# Patient Record
Sex: Male | Born: 1940 | Race: Black or African American | Hispanic: No | Smoking: Current every day smoker
Health system: Southern US, Community
[De-identification: ages and names within clinical notes are randomized; demographics above are authoritative.]

## PROBLEM LIST (undated history)

## (undated) DIAGNOSIS — K089 Disorder of teeth and supporting structures, unspecified: Secondary | ICD-10-CM

## (undated) DIAGNOSIS — I4892 Unspecified atrial flutter: Secondary | ICD-10-CM

## (undated) DIAGNOSIS — I509 Heart failure, unspecified: Secondary | ICD-10-CM

## (undated) DIAGNOSIS — D509 Iron deficiency anemia, unspecified: Secondary | ICD-10-CM

## (undated) DIAGNOSIS — I340 Nonrheumatic mitral (valve) insufficiency: Secondary | ICD-10-CM

## (undated) DIAGNOSIS — J449 Chronic obstructive pulmonary disease, unspecified: Secondary | ICD-10-CM

## (undated) DIAGNOSIS — Z9289 Personal history of other medical treatment: Secondary | ICD-10-CM

## (undated) DIAGNOSIS — I351 Nonrheumatic aortic (valve) insufficiency: Secondary | ICD-10-CM

## (undated) DIAGNOSIS — F0390 Unspecified dementia without behavioral disturbance: Secondary | ICD-10-CM

## (undated) DIAGNOSIS — C61 Malignant neoplasm of prostate: Secondary | ICD-10-CM

## (undated) HISTORY — PX: TOTAL HIP ARTHROPLASTY: SHX124

## (undated) HISTORY — DX: Nonrheumatic mitral (valve) insufficiency: I34.0

## (undated) HISTORY — DX: Chronic obstructive pulmonary disease, unspecified: J44.9

## (undated) HISTORY — DX: Nonrheumatic aortic (valve) insufficiency: I35.1

## (undated) HISTORY — DX: Iron deficiency anemia, unspecified: D50.9

## (undated) HISTORY — DX: Malignant neoplasm of prostate: C61

## (undated) HISTORY — DX: Disorder of teeth and supporting structures, unspecified: K08.9

## (undated) HISTORY — PX: JOINT REPLACEMENT: SHX530

## (undated) SURGERY — INCISION AND DRAINAGE
Anesthesia: General | Laterality: Right

## (undated) SURGERY — IRRIGATION AND DEBRIDEMENT HIP
Anesthesia: General | Laterality: Right

---

## 1998-03-31 ENCOUNTER — Emergency Department (HOSPITAL_COMMUNITY): Admission: EM | Admit: 1998-03-31 | Discharge: 1998-03-31 | Payer: Self-pay | Admitting: Emergency Medicine

## 1998-09-04 ENCOUNTER — Encounter: Payer: Self-pay | Admitting: Orthopedic Surgery

## 1998-09-05 ENCOUNTER — Inpatient Hospital Stay (HOSPITAL_COMMUNITY): Admission: RE | Admit: 1998-09-05 | Discharge: 1998-09-10 | Payer: Self-pay | Admitting: Orthopedic Surgery

## 1998-09-05 ENCOUNTER — Encounter: Payer: Self-pay | Admitting: Orthopedic Surgery

## 1998-09-10 ENCOUNTER — Inpatient Hospital Stay (HOSPITAL_COMMUNITY)
Admission: RE | Admit: 1998-09-10 | Discharge: 1998-09-13 | Payer: Self-pay | Admitting: Physical Medicine & Rehabilitation

## 2000-11-18 ENCOUNTER — Encounter: Payer: Self-pay | Admitting: Orthopedic Surgery

## 2000-11-23 ENCOUNTER — Inpatient Hospital Stay (HOSPITAL_COMMUNITY): Admission: RE | Admit: 2000-11-23 | Discharge: 2000-11-27 | Payer: Self-pay | Admitting: Orthopedic Surgery

## 2000-11-23 ENCOUNTER — Encounter: Payer: Self-pay | Admitting: Orthopedic Surgery

## 2002-03-26 ENCOUNTER — Emergency Department (HOSPITAL_COMMUNITY): Admission: EM | Admit: 2002-03-26 | Discharge: 2002-03-26 | Payer: Self-pay

## 2004-12-02 ENCOUNTER — Ambulatory Visit (HOSPITAL_COMMUNITY): Admission: RE | Admit: 2004-12-02 | Discharge: 2004-12-02 | Payer: Self-pay | Admitting: Orthopedic Surgery

## 2005-10-20 ENCOUNTER — Emergency Department (HOSPITAL_COMMUNITY): Admission: EM | Admit: 2005-10-20 | Discharge: 2005-10-21 | Payer: Self-pay | Admitting: Emergency Medicine

## 2005-10-21 ENCOUNTER — Encounter: Payer: Self-pay | Admitting: Vascular Surgery

## 2005-10-21 ENCOUNTER — Emergency Department (HOSPITAL_COMMUNITY): Admission: EM | Admit: 2005-10-21 | Discharge: 2005-10-21 | Payer: Self-pay | Admitting: Family Medicine

## 2005-10-21 ENCOUNTER — Ambulatory Visit (HOSPITAL_COMMUNITY): Admission: RE | Admit: 2005-10-21 | Discharge: 2005-10-21 | Payer: Self-pay | Admitting: *Deleted

## 2005-11-12 ENCOUNTER — Inpatient Hospital Stay (HOSPITAL_COMMUNITY): Admission: EM | Admit: 2005-11-12 | Discharge: 2005-11-13 | Payer: Self-pay | Admitting: Emergency Medicine

## 2008-11-27 ENCOUNTER — Inpatient Hospital Stay (HOSPITAL_COMMUNITY): Admission: EM | Admit: 2008-11-27 | Discharge: 2008-12-04 | Payer: Self-pay | Admitting: Emergency Medicine

## 2008-11-28 ENCOUNTER — Encounter (INDEPENDENT_AMBULATORY_CARE_PROVIDER_SITE_OTHER): Payer: Self-pay | Admitting: *Deleted

## 2008-11-30 ENCOUNTER — Encounter (INDEPENDENT_AMBULATORY_CARE_PROVIDER_SITE_OTHER): Payer: Self-pay | Admitting: Internal Medicine

## 2008-12-01 ENCOUNTER — Encounter (INDEPENDENT_AMBULATORY_CARE_PROVIDER_SITE_OTHER): Payer: Self-pay | Admitting: Internal Medicine

## 2008-12-01 ENCOUNTER — Ambulatory Visit: Payer: Self-pay | Admitting: Vascular Surgery

## 2008-12-02 ENCOUNTER — Encounter (INDEPENDENT_AMBULATORY_CARE_PROVIDER_SITE_OTHER): Payer: Self-pay | Admitting: *Deleted

## 2008-12-03 ENCOUNTER — Encounter (INDEPENDENT_AMBULATORY_CARE_PROVIDER_SITE_OTHER): Payer: Self-pay | Admitting: *Deleted

## 2008-12-03 ENCOUNTER — Encounter: Payer: Self-pay | Admitting: Cardiology

## 2008-12-04 ENCOUNTER — Encounter (INDEPENDENT_AMBULATORY_CARE_PROVIDER_SITE_OTHER): Payer: Self-pay | Admitting: *Deleted

## 2009-01-05 DIAGNOSIS — C61 Malignant neoplasm of prostate: Secondary | ICD-10-CM

## 2009-01-05 HISTORY — DX: Malignant neoplasm of prostate: C61

## 2009-02-12 ENCOUNTER — Ambulatory Visit (HOSPITAL_COMMUNITY): Admission: RE | Admit: 2009-02-12 | Discharge: 2009-02-12 | Payer: Self-pay | Admitting: Urology

## 2009-02-13 ENCOUNTER — Ambulatory Visit: Admission: RE | Admit: 2009-02-13 | Discharge: 2009-05-14 | Payer: Self-pay | Admitting: Radiation Oncology

## 2009-05-15 ENCOUNTER — Ambulatory Visit: Admission: RE | Admit: 2009-05-15 | Discharge: 2009-05-20 | Payer: Self-pay | Admitting: Radiation Oncology

## 2009-05-30 ENCOUNTER — Ambulatory Visit (HOSPITAL_BASED_OUTPATIENT_CLINIC_OR_DEPARTMENT_OTHER): Admission: RE | Admit: 2009-05-30 | Discharge: 2009-05-30 | Payer: Self-pay | Admitting: Urology

## 2009-06-12 ENCOUNTER — Ambulatory Visit: Admission: RE | Admit: 2009-06-12 | Discharge: 2009-09-10 | Payer: Self-pay | Admitting: Radiation Oncology

## 2009-09-25 ENCOUNTER — Ambulatory Visit
Admission: RE | Admit: 2009-09-25 | Discharge: 2009-12-24 | Payer: Self-pay | Source: Home / Self Care | Attending: Radiation Oncology | Admitting: Radiation Oncology

## 2010-03-24 LAB — CBC
HCT: 33.1 % — ABNORMAL LOW (ref 39.0–52.0)
MCV: 88 fL (ref 78.0–100.0)
Platelets: 318 10*3/uL (ref 150–400)
RDW: 16.6 % — ABNORMAL HIGH (ref 11.5–15.5)

## 2010-03-24 LAB — APTT: aPTT: 34 seconds (ref 24–37)

## 2010-03-24 LAB — COMPREHENSIVE METABOLIC PANEL
Albumin: 3.7 g/dL (ref 3.5–5.2)
BUN: 14 mg/dL (ref 6–23)
Creatinine, Ser: 0.85 mg/dL (ref 0.4–1.5)
GFR calc Af Amer: >60 mL/min (ref >60)
Total Protein: 7.6 g/dL (ref 6.0–8.3)

## 2010-03-24 LAB — PROTIME-INR: INR: 1.42 (ref 0.00–1.49)

## 2010-03-24 LAB — POCT I-STAT, CHEM 8
Chloride: 106 mEq/L (ref 96–112)
HCT: 33 % — ABNORMAL LOW (ref 39.0–52.0)
Potassium: 4.6 mEq/L (ref 3.5–5.1)
Sodium: 137 mEq/L (ref 135–145)

## 2010-04-09 LAB — CBC
HCT: 30.5 % — ABNORMAL LOW (ref 39.0–52.0)
HCT: 31.6 % — ABNORMAL LOW (ref 39.0–52.0)
HCT: 31.7 % — ABNORMAL LOW (ref 39.0–52.0)
HCT: 31.7 % — ABNORMAL LOW (ref 39.0–52.0)
HCT: 32.2 % — ABNORMAL LOW (ref 39.0–52.0)
HCT: 32.3 % — ABNORMAL LOW (ref 39.0–52.0)
HCT: 33.1 % — ABNORMAL LOW (ref 39.0–52.0)
Hemoglobin: 10.3 g/dL — ABNORMAL LOW (ref 13.0–17.0)
Hemoglobin: 10.6 g/dL — ABNORMAL LOW (ref 13.0–17.0)
Hemoglobin: 10.8 g/dL — ABNORMAL LOW (ref 13.0–17.0)
Hemoglobin: 10.9 g/dL — ABNORMAL LOW (ref 13.0–17.0)
Hemoglobin: 10.9 g/dL — ABNORMAL LOW (ref 13.0–17.0)
Hemoglobin: 10.9 g/dL — ABNORMAL LOW (ref 13.0–17.0)
MCHC: 32.8 g/dL (ref 30.0–36.0)
MCHC: 33.5 g/dL (ref 30.0–36.0)
MCHC: 33.7 g/dL (ref 30.0–36.0)
MCHC: 34 g/dL (ref 30.0–36.0)
MCV: 89.8 fL (ref 78.0–100.0)
MCV: 90 fL (ref 78.0–100.0)
MCV: 90.3 fL (ref 78.0–100.0)
MCV: 91.1 fL (ref 78.0–100.0)
MCV: 91.1 fL (ref 78.0–100.0)
MCV: 91.4 fL (ref 78.0–100.0)
Platelets: 263 10*3/uL (ref 150–400)
Platelets: 272 10*3/uL (ref 150–400)
Platelets: 276 10*3/uL (ref 150–400)
Platelets: 301 10*3/uL (ref 150–400)
Platelets: 308 10*3/uL (ref 150–400)
Platelets: 326 10*3/uL (ref 150–400)
Platelets: 331 10*3/uL (ref 150–400)
RBC: 3.34 MIL/uL — ABNORMAL LOW (ref 4.22–5.81)
RBC: 3.47 MIL/uL — ABNORMAL LOW (ref 4.22–5.81)
RBC: 3.52 MIL/uL — ABNORMAL LOW (ref 4.22–5.81)
RBC: 3.52 MIL/uL — ABNORMAL LOW (ref 4.22–5.81)
RBC: 3.56 MIL/uL — ABNORMAL LOW (ref 4.22–5.81)
RBC: 3.64 MIL/uL — ABNORMAL LOW (ref 4.22–5.81)
RDW: 14.7 % (ref 11.5–15.5)
RDW: 14.9 % (ref 11.5–15.5)
RDW: 14.9 % (ref 11.5–15.5)
RDW: 15.1 % (ref 11.5–15.5)
RDW: 15.3 % (ref 11.5–15.5)
WBC: 3.5 10*3/uL — ABNORMAL LOW (ref 4.0–10.5)
WBC: 4.3 10*3/uL (ref 4.0–10.5)
WBC: 4.4 10*3/uL (ref 4.0–10.5)
WBC: 4.5 10*3/uL (ref 4.0–10.5)
WBC: 4.8 10*3/uL (ref 4.0–10.5)
WBC: 5.7 10*3/uL (ref 4.0–10.5)
WBC: 6 10*3/uL (ref 4.0–10.5)
WBC: 6.6 10*3/uL (ref 4.0–10.5)

## 2010-04-09 LAB — URINALYSIS, ROUTINE W REFLEX MICROSCOPIC
Ketones, ur: NEGATIVE mg/dL
Nitrite: NEGATIVE
Protein, ur: NEGATIVE mg/dL
Urobilinogen, UA: 1 mg/dL (ref 0.0–1.0)

## 2010-04-09 LAB — BASIC METABOLIC PANEL
BUN: 11 mg/dL (ref 6–23)
BUN: 15 mg/dL (ref 6–23)
BUN: 16 mg/dL (ref 6–23)
BUN: 21 mg/dL (ref 6–23)
BUN: 7 mg/dL (ref 6–23)
CO2: 24 mEq/L (ref 19–32)
CO2: 25 mEq/L (ref 19–32)
CO2: 26 mEq/L (ref 19–32)
CO2: 27 mEq/L (ref 19–32)
CO2: 29 mEq/L (ref 19–32)
Calcium: 8.7 mg/dL (ref 8.4–10.5)
Calcium: 9 mg/dL (ref 8.4–10.5)
Calcium: 9.3 mg/dL (ref 8.4–10.5)
Calcium: 9.3 mg/dL (ref 8.4–10.5)
Calcium: 9.4 mg/dL (ref 8.4–10.5)
Calcium: 9.5 mg/dL (ref 8.4–10.5)
Chloride: 100 mEq/L (ref 96–112)
Chloride: 102 mEq/L (ref 96–112)
Chloride: 104 mEq/L (ref 96–112)
Chloride: 99 mEq/L (ref 96–112)
Creatinine, Ser: 0.73 mg/dL (ref 0.4–1.5)
Creatinine, Ser: 0.84 mg/dL (ref 0.4–1.5)
Creatinine, Ser: 0.84 mg/dL (ref 0.4–1.5)
Creatinine, Ser: 1.29 mg/dL (ref 0.4–1.5)
Creatinine, Ser: 1.56 mg/dL — ABNORMAL HIGH (ref 0.4–1.5)
Creatinine, Ser: 1.82 mg/dL — ABNORMAL HIGH (ref 0.4–1.5)
GFR calc Af Amer: 54 mL/min — ABNORMAL LOW (ref >60)
GFR calc Af Amer: >60 mL/min (ref >60)
GFR calc Af Amer: >60 mL/min (ref >60)
GFR calc Af Amer: >60 mL/min (ref >60)
GFR calc Af Amer: >60 mL/min (ref >60)
GFR calc non Af Amer: 37 mL/min — ABNORMAL LOW (ref >60)
GFR calc non Af Amer: 45 mL/min — ABNORMAL LOW (ref >60)
GFR calc non Af Amer: 56 mL/min — ABNORMAL LOW (ref >60)
GFR calc non Af Amer: >60 mL/min (ref >60)
GFR calc non Af Amer: >60 mL/min (ref >60)
GFR calc non Af Amer: >60 mL/min (ref >60)
Glucose, Bld: 105 mg/dL — ABNORMAL HIGH (ref 70–99)
Glucose, Bld: 150 mg/dL — ABNORMAL HIGH (ref 70–99)
Glucose, Bld: 173 mg/dL — ABNORMAL HIGH (ref 70–99)
Glucose, Bld: 81 mg/dL (ref 70–99)
Glucose, Bld: 84 mg/dL (ref 70–99)
Glucose, Bld: 94 mg/dL (ref 70–99)
Potassium: 3.9 mEq/L (ref 3.5–5.1)
Potassium: 4 mEq/L (ref 3.5–5.1)
Potassium: 4.1 mEq/L (ref 3.5–5.1)
Potassium: 4.5 mEq/L (ref 3.5–5.1)
Sodium: 133 mEq/L — ABNORMAL LOW (ref 135–145)
Sodium: 135 mEq/L (ref 135–145)
Sodium: 136 mEq/L (ref 135–145)
Sodium: 139 mEq/L (ref 135–145)

## 2010-04-09 LAB — COMPREHENSIVE METABOLIC PANEL
AST: 19 U/L (ref 0–37)
Albumin: 3 g/dL — ABNORMAL LOW (ref 3.5–5.2)
Albumin: 3.6 g/dL (ref 3.5–5.2)
Alkaline Phosphatase: 80 U/L (ref 39–117)
BUN: 11 mg/dL (ref 6–23)
BUN: 8 mg/dL (ref 6–23)
Chloride: 100 mEq/L (ref 96–112)
Chloride: 106 mEq/L (ref 96–112)
Creatinine, Ser: 0.71 mg/dL (ref 0.4–1.5)
GFR calc Af Amer: >60 mL/min (ref >60)
Potassium: 3.3 mEq/L — ABNORMAL LOW (ref 3.5–5.1)
Potassium: 5.3 mEq/L — ABNORMAL HIGH (ref 3.5–5.1)
Total Bilirubin: 0.8 mg/dL (ref 0.3–1.2)
Total Bilirubin: 1.4 mg/dL — ABNORMAL HIGH (ref 0.3–1.2)
Total Protein: 6.8 g/dL (ref 6.0–8.3)

## 2010-04-09 LAB — DIFFERENTIAL
Basophils Relative: 1 % (ref 0–1)
Eosinophils Absolute: 0 10*3/uL (ref 0.0–0.7)
Monocytes Absolute: 0.3 10*3/uL (ref 0.1–1.0)
Monocytes Relative: 8 % (ref 3–12)
Neutrophils Relative %: 61 % (ref 43–77)

## 2010-04-09 LAB — PROTIME-INR
INR: 1.21 (ref 0.00–1.49)
INR: 1.54 — ABNORMAL HIGH (ref 0.00–1.49)
INR: 2.02 — ABNORMAL HIGH (ref 0.00–1.49)
INR: 2.39 — ABNORMAL HIGH (ref 0.00–1.49)
Prothrombin Time: 15.2 seconds (ref 11.6–15.2)
Prothrombin Time: 22.7 seconds — ABNORMAL HIGH (ref 11.6–15.2)
Prothrombin Time: 25.9 seconds — ABNORMAL HIGH (ref 11.6–15.2)
Prothrombin Time: 26.3 seconds — ABNORMAL HIGH (ref 11.6–15.2)
Prothrombin Time: 27.3 seconds — ABNORMAL HIGH (ref 11.6–15.2)

## 2010-04-09 LAB — CARDIAC PANEL(CRET KIN+CKTOT+MB+TROPI)
CK, MB: 1.2 ng/mL (ref 0.3–4.0)
Relative Index: INVALID (ref 0.0–2.5)
Relative Index: INVALID (ref 0.0–2.5)
Total CK: 73 U/L (ref 7–232)
Total CK: 84 U/L (ref 7–232)
Troponin I: 0.01 ng/mL (ref 0.00–0.06)
Troponin I: 0.01 ng/mL (ref 0.00–0.06)

## 2010-04-09 LAB — TROPONIN I: Troponin I: 0.02 ng/mL (ref 0.00–0.06)

## 2010-04-09 LAB — RAPID URINE DRUG SCREEN, HOSP PERFORMED
Amphetamines: NOT DETECTED
Barbiturates: NOT DETECTED
Benzodiazepines: NOT DETECTED
Cocaine: NOT DETECTED
Opiates: NOT DETECTED
Tetrahydrocannabinol: NOT DETECTED

## 2010-04-09 LAB — BLOOD GAS, ARTERIAL
Acid-base deficit: 5.8 mmol/L — ABNORMAL HIGH (ref 0.0–2.0)
Bicarbonate: 19.7 mEq/L — ABNORMAL LOW (ref 20.0–24.0)
TCO2: 21 mmol/L (ref 0–100)
pCO2 arterial: 41.4 mmHg (ref 35.0–45.0)

## 2010-04-09 LAB — PROTEIN ELECTROPHORESIS, SERUM
Albumin ELP: 44.3 % — ABNORMAL LOW (ref 55.8–66.1)
Alpha-1-Globulin: 7.3 % — ABNORMAL HIGH (ref 2.9–4.9)
Alpha-2-Globulin: 12.9 % — ABNORMAL HIGH (ref 7.1–11.8)
Gamma Globulin: 20 % — ABNORMAL HIGH (ref 11.1–18.8)

## 2010-04-09 LAB — POCT CARDIAC MARKERS
Myoglobin, poc: 60 ng/mL (ref 12–200)
Troponin i, poc: <0.05 ng/mL (ref 0.00–0.09)

## 2010-04-09 LAB — IRON AND TIBC: Iron: 21 ug/dL — ABNORMAL LOW (ref 42–135)

## 2010-04-09 LAB — HEPATITIS PANEL, ACUTE
HCV Ab: NEGATIVE
Hepatitis B Surface Ag: NEGATIVE

## 2010-04-09 LAB — CK TOTAL AND CKMB (NOT AT ARMC)
CK, MB: 1 ng/mL (ref 0.3–4.0)
Relative Index: INVALID (ref 0.0–2.5)

## 2010-04-09 LAB — BRAIN NATRIURETIC PEPTIDE
Pro B Natriuretic peptide (BNP): 331 pg/mL — ABNORMAL HIGH (ref 0.0–100.0)
Pro B Natriuretic peptide (BNP): 86 pg/mL (ref 0.0–100.0)

## 2010-04-09 LAB — VITAMIN B12: Vitamin B-12: 658 pg/mL (ref 211–911)

## 2010-04-09 LAB — DIGOXIN LEVEL: Digoxin Level: 0.6 ng/mL — ABNORMAL LOW (ref 0.8–2.0)

## 2010-04-09 LAB — HEPARIN LEVEL (UNFRACTIONATED): Heparin Unfractionated: 0.34 IU/mL (ref 0.30–0.70)

## 2010-04-09 LAB — FERRITIN: Ferritin: 35 ng/mL (ref 22–322)

## 2010-04-09 LAB — TSH: TSH: 3.995 u[IU]/mL (ref 0.350–4.500)

## 2010-04-09 LAB — MAGNESIUM: Magnesium: 1.7 mg/dL (ref 1.5–2.5)

## 2010-05-23 NOTE — Op Note (Signed)
[place]. [facility]  Patient:    Chris [name], [name] Visit Number: [id number] MRN: [id number]          Service Type: Attending:  Kennieth Rad, M.D. Dictated by:   Kennieth Rad, M.D. Proc. Date: 11/23/00 Adm. Date:  11/23/00                             Operative Report  PREOPERATIVE DIAGNOSIS:  Degenerative joint disease left hip.  POSTOPERATIVE DIAGNOSIS:  Degenerative joint disease left hip.  ANESTHESIA:  General.  SURGEON:  Kennieth Rad, M.D.  OPERATION:  Left total hip arthroplasty, DePuy system.  PROCEDURE:  The patient was taken to the operating room after given adequate preoperative medication.  He was given general anesthesia and was intubated. The patient was placed in the left lateral position.  The left hip was prepped with DuraPrep and draped in the sterile manner.  A posterior approach was made on the left hip going through the skin and subcutaneous tissues down to the fascia lata.  Sharp rotators were identified and released.  The capsule was identified and resected.  The hip was dislocated.  Femoral neck cutting guide was put in place.  The femoral head was resected.  Once the retractor was in place, the acetabulum was exposed.  Soft tissue dissection around the acetabulum was done with resection of the capsule.  Reaming of the acetabulum was done for a 50/70 mm size.  After adequate reaming down to good bleeding surface, a trial acetabular component was put in place and found to be a very good snug fit.  A 10 degree ______ trial component was put in place.  Next, attention was turned to the femur.  Reaming was started with the use of the cutter followed by reamer staying laterally.  Reaming was done for an 18 mm porous coated stem.  After adequate reaming was done, irrigation was done with antibiotic solution.  Trial component was put in place.  Testing of the femoral neck was done and a +5 28 mm femoral head was found to be  good and stable with good flexion and extension.  No telescoping or instability of the hip.  Copious irrigation was done on the femur and the acetabulum.  The final acetabular component was hammered in place which was a 58 mm.  A hole eliminator was put in place followed by placement of the ten degree liner, 58/70 mm +4 off set.  Femoral stem was put in and hammered in place and found to be good and stable with good calcar contact.  The +5 28 mm femoral head was tapped in place.  The hip was reduced.  Again, range of motion was tested and found to be good and stable with no telescoping.  There was some tightness of adductors.  Copious irrigation was done.  Wound closure was then done with 0 Vicryl in the fascia, 2-0 for the subcutaneous tissues and skin staples for the skin.  Compressive dressing was applied.  The patient returned to the supine position.  Small incision was made over the adductor tendons and percutaneous release of the adductor tendons was done.  Skin staples were used to close the adductor incision.  Compressive dressing was applied.  The patient was placed in hip abduction pillow.  He tolerated the procedure quite well and went to the recovery room in stable and satisfactory condition. Dictated by:   Kennieth Rad,  M.D. Attending:  Kennieth Rad, M.D. DD:  12/15/00 TD:  12/15/00 Job: [phone] [id number]

## 2010-05-23 NOTE — H&P (Signed)
NAMEJOAH, PATLAN NO.:  [id number]   MEDICAL RECORD NO.:  [id number]          PATIENT TYPE:  INP   LOCATION:  5705                         FACILITY:  [facility]   PHYSICIAN:  Myrtie Neither, MD      DATE OF BIRTH:  01/08/1940   DATE OF ADMISSION:  11/12/2005  DATE OF DISCHARGE:                                HISTORY & PHYSICAL   CHIEF COMPLAINT:  Painful, deformed left hip.   HISTORY OF PRESENT ILLNESS:  This is a 70 year old black male, who states  that he was trying to help a friend of his move a couch and in the process  made an awkward move with his left hip.  The patient has a history of  bilateral total hip replacements.  The patient states that he developed  severe pain and was unable to straighten his left leg, and was brought to  [facility] Emergency Room for treatment.  The patient denies any other  injury.   PAST MEDICAL HISTORY:  Bilateral total hip arthroplasty.  No history of high  blood pressure or diabetes.   ALLERGIES:  PENICILLIN.   CURRENT MEDICATIONS:  Aleve.   REVIEW OF SYSTEMS:  Basically that of minor joint pain in knees and back.  Otherwise, no cardiac, respiratory, urinary, or bowel symptoms.   SOCIAL HISTORY:  The patient does smoke 1 pack of cigarettes per day and  does also use alcohol.   FAMILY HISTORY:  Noncontributory.   PHYSICAL EXAMINATION:  GENERAL:  Alert and oriented in no acute distress.  VITAL SIGNS:  Temperature 97.2.  Blood pressure 127/69.  Pulse 96.  Respirations 20.  HEENT:  Head normocephalic.  Anicteric sclerae.  Conjunctivae and sclerae  clear.  NECK:  Supple.  CHEST:  Clear.  CARDIAC:  S1, S2, and regular.  EXTREMITIES:  The left hip is in a flexed and internally rotated position.  Neurovascular status is intact.  Tender anteriorly and laterally.  Pulses  intact.   LABORATORY DATA:  X-rays revealed dislocation of the left total hip  arthroplasty.   IMPRESSION:  Dislocation left total hip.   PLAN:   Closed manipulated reduction under anesthesia.      Myrtie Neither, MD  Electronically Signed     AC/MEDQ  D:  11/12/2005  T:  11/13/2005  Job:  [id number]

## 2010-05-23 NOTE — H&P (Signed)
[place]. [facility]  Patient:    [name], [name] Visit Number: [id number] MRN: [id number]          Service Type: Attending:  Kennieth Rad, M.D. Dictated by:   Kennieth Rad, M.D. Adm. Date:  11/23/00                           History and Physical  CHIEF COMPLAINT:  Painful left hip.  HISTORY OF PRESENT ILLNESS:  This is a 70 year old who had been followed in the office for bilateral severe degenerative joint disease of his hips.  The patient had right hip replacement in 2000.  Presently, the left hip has progressively gotten worse with pain both at rest as well as on ambulation, progressive weakness and giving way.  PAST MEDICAL HISTORY:  Right total hip replacement in 2000, no history of high blood pressure or diabetes.  ALLERGIES:  PENICILLIN.  MEDICATIONS:  Celebrex 200 b.i.d.  HABITS:  The patient drinks one beer per day.  Smokes one pack of cigarettes a day also.  FAMILY HISTORY:  Noncontributory.  REVIEW OF SYSTEMS:  Basically that of history of present illness.  No cardiac, respiratory, urinary or bowel symptoms.  PHYSICAL EXAMINATION:  VITAL SIGNS:  Temperature 97.7, pulse 80, respirations 16, blood pressure 148/80.  Height 5 ft. 10 in.  Weight 152.  Alert, oriented in no acute distress.  The patient limps with the use of walker on the left side.  HEENT:  Head is normocephalic.  Eyes-conjunctiva and sclera are clear.  NECK:  Supple.  CHEST:  Clear.  CARDIOVASCULAR:  S1 and S2 regular.  EXTREMITIES:  Left hip limited rotation, pain on flexion and extension.  NEUROLOGIC:  Neurovascular status is intact.  X-RAYS:  Severe degenerative joint disease of the left hip and loss of joint space and sclerosis.  IMPRESSION:  Severe degenerative joint disease left hip. Dictated by:   Kennieth Rad, M.D. Attending:  Kennieth Rad, M.D. DD:  12/15/00 TD:  12/15/00 Job: [phone] [id number]

## 2010-05-23 NOTE — Op Note (Signed)
Chris [name], PEMBLE NO.:  [id number]   MEDICAL RECORD NO.:  [id number]          PATIENT TYPE:  INP   LOCATION:  5705                         FACILITY:  [facility]   PHYSICIAN:  Myrtie Neither, MD      DATE OF BIRTH:  07/30/1940   DATE OF PROCEDURE:  DATE OF DISCHARGE:                                 OPERATIVE REPORT   PREOPERATIVE DIAGNOSIS:  Dislocation of left total hip.   POSTOPERATIVE DIAGNOSIS:  Dislocation of left total hip.   ANESTHESIA:  General.   PROCEDURE:  Closed manipulated reduction of left total hip.   OPERATION:  The patient was taken to the operating room after given adequate  preoperative medication, given general anesthesia, and intubated.  The  patient was placed in the supine position.  The C-arm was used to visualize  manipulated reduction.  Gentle traction and counter traction were placed  across the hip and pelvic area.  After application of traction, the hip was  able to be reduced.  This was visualized with the use of the C-arm.  The  patient was placed with a pillow between the legs for the time being and to  be fitted with a hip abduction brace in the recovery room.  The patient will  be kept 23 hours and will be discharged in the a.m. on Percocet 5 mg 1 q.6  h. p.r.n. pain.  The patient is to keep the hip abduction brace on at all  times and to use crutches for ambulation weightbearing as tolerated.  The  patient is to be seen back in the office in 10 days.  The patient is being  discharged in stable and satisfactory condition.      Myrtie Neither, MD  Electronically Signed     AC/MEDQ  D:  11/12/2005  T:  11/13/2005  Job:  [id number]

## 2010-05-23 NOTE — Discharge Summary (Signed)
[place]. [facility]  Patient:    Chris [name], [name] Visit Number: [id number] MRN: [id number]          Service Type: Attending:  Kennieth Rad, M.D. Dictated by:   Kennieth Rad, M.D. Adm. Date:  11/23/00 Disc. Date: 11/27/00                             Discharge Summary  ADMISSION DIAGNOSIS:  Severe degenerative joint disease left hip.  DISCHARGE DIAGNOSES:  Severe degenerative joint disease left hip.  COMPLICATIONS:  None.  INFECTIONS:  None.  OPERATION:  Left total hip arthroplasty.  PERTINENT HISTORY:  This is a 70 year old who is followed for degenerative joint disease of his left hip with pain both at rest and activity and progressive weakness.  Pertinent physical examination was done of the left hip with limited range of motion, tender anterior and laterally.  Also tight adductors.  HOSPITAL COURSE:  The patient underwent left total hip arthroplasty.  The patient tolerated the procedure well.  POSTOPERATIVE COURSE:  The patient was started on Coumadin and IV antibiotics, progressed quite well in physical therapy with ambulation with the use of walker, partial weight bearing on the left hip.  The patient progressed well enough to be discharged and was discharged and continued on Coumadin.  Home health and physical therapy was arranged.  The patient was discharged in stable and satisfactory condition to return in ten day period.  The patient was discharged in stable and satisfactory condition. Dictated by:   Kennieth Rad, M.D. Attending:  Kennieth Rad, M.D. DD:  12/15/00 TD:  12/15/00 Job: [phone] [id number]

## 2010-12-26 ENCOUNTER — Encounter: Payer: Self-pay | Admitting: *Deleted

## 2010-12-26 NOTE — Progress Notes (Signed)
On 02/14/09 I=PSS results= 0000003 score=3

## 2011-05-28 ENCOUNTER — Encounter: Payer: Self-pay | Admitting: *Deleted

## 2011-05-28 DIAGNOSIS — Z8679 Personal history of other diseases of the circulatory system: Secondary | ICD-10-CM | POA: Insufficient documentation

## 2011-05-28 DIAGNOSIS — C61 Malignant neoplasm of prostate: Secondary | ICD-10-CM | POA: Insufficient documentation

## 2011-05-28 DIAGNOSIS — K089 Disorder of teeth and supporting structures, unspecified: Secondary | ICD-10-CM | POA: Insufficient documentation

## 2011-05-28 DIAGNOSIS — D509 Iron deficiency anemia, unspecified: Secondary | ICD-10-CM | POA: Insufficient documentation

## 2011-05-28 DIAGNOSIS — J449 Chronic obstructive pulmonary disease, unspecified: Secondary | ICD-10-CM | POA: Insufficient documentation

## 2011-08-11 ENCOUNTER — Other Ambulatory Visit: Payer: Self-pay | Admitting: Internal Medicine

## 2011-08-11 ENCOUNTER — Ambulatory Visit (HOSPITAL_COMMUNITY)
Admission: RE | Admit: 2011-08-11 | Discharge: 2011-08-11 | Disposition: A | Discharge status: Home / Self Care | Payer: Medicare Other | Source: Ambulatory Visit | Attending: Internal Medicine | Admitting: Internal Medicine

## 2011-08-11 DIAGNOSIS — R079 Chest pain, unspecified: Secondary | ICD-10-CM

## 2011-08-11 DIAGNOSIS — R0602 Shortness of breath: Secondary | ICD-10-CM | POA: Insufficient documentation

## 2011-08-19 ENCOUNTER — Inpatient Hospital Stay (HOSPITAL_COMMUNITY)
Admission: EM | Admit: 2011-08-19 | Discharge: 2011-08-22 | DRG: 811 | Disposition: A | Discharge status: Home / Self Care | Payer: Medicare Other | Attending: Internal Medicine | Admitting: Internal Medicine

## 2011-08-19 ENCOUNTER — Encounter (HOSPITAL_COMMUNITY): Payer: Self-pay | Admitting: Emergency Medicine

## 2011-08-19 DIAGNOSIS — R627 Adult failure to thrive: Secondary | ICD-10-CM | POA: Diagnosis present

## 2011-08-19 DIAGNOSIS — C61 Malignant neoplasm of prostate: Secondary | ICD-10-CM | POA: Diagnosis present

## 2011-08-19 DIAGNOSIS — Z23 Encounter for immunization: Secondary | ICD-10-CM

## 2011-08-19 DIAGNOSIS — I340 Nonrheumatic mitral (valve) insufficiency: Secondary | ICD-10-CM | POA: Diagnosis present

## 2011-08-19 DIAGNOSIS — K089 Disorder of teeth and supporting structures, unspecified: Secondary | ICD-10-CM | POA: Diagnosis present

## 2011-08-19 DIAGNOSIS — I428 Other cardiomyopathies: Secondary | ICD-10-CM | POA: Diagnosis present

## 2011-08-19 DIAGNOSIS — F0390 Unspecified dementia without behavioral disturbance: Secondary | ICD-10-CM | POA: Diagnosis present

## 2011-08-19 DIAGNOSIS — Z8546 Personal history of malignant neoplasm of prostate: Secondary | ICD-10-CM

## 2011-08-19 DIAGNOSIS — J4489 Other specified chronic obstructive pulmonary disease: Secondary | ICD-10-CM | POA: Diagnosis present

## 2011-08-19 DIAGNOSIS — F17200 Nicotine dependence, unspecified, uncomplicated: Secondary | ICD-10-CM | POA: Diagnosis present

## 2011-08-19 DIAGNOSIS — D500 Iron deficiency anemia secondary to blood loss (chronic): Principal | ICD-10-CM | POA: Diagnosis present

## 2011-08-19 DIAGNOSIS — Z96649 Presence of unspecified artificial hip joint: Secondary | ICD-10-CM

## 2011-08-19 DIAGNOSIS — D649 Anemia, unspecified: Secondary | ICD-10-CM | POA: Diagnosis present

## 2011-08-19 DIAGNOSIS — E43 Unspecified severe protein-calorie malnutrition: Secondary | ICD-10-CM | POA: Diagnosis present

## 2011-08-19 DIAGNOSIS — K648 Other hemorrhoids: Secondary | ICD-10-CM | POA: Diagnosis present

## 2011-08-19 DIAGNOSIS — D509 Iron deficiency anemia, unspecified: Secondary | ICD-10-CM | POA: Diagnosis present

## 2011-08-19 DIAGNOSIS — Z8679 Personal history of other diseases of the circulatory system: Secondary | ICD-10-CM

## 2011-08-19 DIAGNOSIS — Z880 Allergy status to penicillin: Secondary | ICD-10-CM

## 2011-08-19 DIAGNOSIS — Z9289 Personal history of other medical treatment: Secondary | ICD-10-CM

## 2011-08-19 DIAGNOSIS — I059 Rheumatic mitral valve disease, unspecified: Secondary | ICD-10-CM | POA: Diagnosis present

## 2011-08-19 DIAGNOSIS — R634 Abnormal weight loss: Secondary | ICD-10-CM | POA: Diagnosis present

## 2011-08-19 DIAGNOSIS — K5730 Diverticulosis of large intestine without perforation or abscess without bleeding: Secondary | ICD-10-CM | POA: Diagnosis present

## 2011-08-19 DIAGNOSIS — J449 Chronic obstructive pulmonary disease, unspecified: Secondary | ICD-10-CM | POA: Diagnosis present

## 2011-08-19 HISTORY — DX: Personal history of other medical treatment: Z92.89

## 2011-08-19 HISTORY — DX: Heart failure, unspecified: I50.9

## 2011-08-19 HISTORY — DX: Unspecified atrial flutter: I48.92

## 2011-08-19 LAB — CBC WITH DIFFERENTIAL/PLATELET
Eosinophils Relative: 1 % (ref 0–5)
HCT: 17.2 % — ABNORMAL LOW (ref 39.0–52.0)
Lymphs Abs: 1.2 10*3/uL (ref 0.7–4.0)
MCH: 17.2 pg — ABNORMAL LOW (ref 26.0–34.0)
MCV: 62.8 fL — ABNORMAL LOW (ref 78.0–100.0)
Monocytes Relative: 9 % (ref 3–12)
Neutro Abs: 3.1 10*3/uL (ref 1.7–7.7)
RBC: 2.74 MIL/uL — ABNORMAL LOW (ref 4.22–5.81)
WBC: 4.7 10*3/uL (ref 4.0–10.5)

## 2011-08-19 LAB — ABO/RH: ABO/RH(D): B POS

## 2011-08-19 LAB — COMPREHENSIVE METABOLIC PANEL
BUN: 13 mg/dL (ref 6–23)
CO2: 24 mEq/L (ref 19–32)
Calcium: 9.5 mg/dL (ref 8.4–10.5)
Creatinine, Ser: 0.73 mg/dL (ref 0.50–1.35)
GFR calc Af Amer: >90 mL/min (ref >90)
GFR calc non Af Amer: >90 mL/min (ref >90)
Glucose, Bld: 97 mg/dL (ref 70–99)
Total Protein: 8.3 g/dL (ref 6.0–8.3)

## 2011-08-19 LAB — RETICULOCYTES: Retic Ct Pct: 0.8 % (ref 0.4–3.1)

## 2011-08-19 LAB — PREPARE RBC (CROSSMATCH)

## 2011-08-19 LAB — PROTIME-INR: Prothrombin Time: 19.2 seconds — ABNORMAL HIGH (ref 11.6–15.2)

## 2011-08-19 MED ORDER — ALUM & MAG HYDROXIDE-SIMETH 200-200-20 MG/5ML PO SUSP: 30.0000 mL | Freq: Four times a day (QID) | ORAL | Status: DC | PRN

## 2011-08-19 MED ORDER — ACETAMINOPHEN 650 MG RE SUPP: 650.0000 mg | Freq: Four times a day (QID) | RECTAL | Status: DC | PRN

## 2011-08-19 MED ORDER — PNEUMOCOCCAL VAC POLYVALENT 25 MCG/0.5ML IJ INJ
0.5000 mL | INJECTION | INTRAMUSCULAR | Status: AC
  Administered 2011-08-20: 0.5 mL via INTRAMUSCULAR
  Filled 2011-08-19: qty 0.5

## 2011-08-19 MED ORDER — ONDANSETRON HCL 4 MG/2ML IJ SOLN
4.0000 mg | Freq: Four times a day (QID) | INTRAMUSCULAR | Status: DC | PRN
  Administered 2011-08-20: 4 mg via INTRAVENOUS
  Filled 2011-08-19: qty 2

## 2011-08-19 MED ORDER — PEG 3350-KCL-NA BICARB-NACL 420 G PO SOLR
4000.0000 mL | Freq: Once | ORAL | Status: AC
  Administered 2011-08-20: 4000 mL via ORAL
  Filled 2011-08-19: qty 4000

## 2011-08-19 MED ORDER — ACETAMINOPHEN 325 MG PO TABS: 650.0000 mg | ORAL_TABLET | Freq: Four times a day (QID) | ORAL | Status: DC | PRN

## 2011-08-19 MED ORDER — SODIUM CHLORIDE 0.9 % IJ SOLN
3.0000 mL | Freq: Two times a day (BID) | INTRAMUSCULAR | Status: DC
  Administered 2011-08-20 – 2011-08-21 (×3): 3 mL via INTRAVENOUS

## 2011-08-19 MED ORDER — ONDANSETRON HCL 4 MG PO TABS: 4.0000 mg | ORAL_TABLET | Freq: Four times a day (QID) | ORAL | Status: DC | PRN

## 2011-08-19 NOTE — ED Notes (Signed)
Received pt via EMS with c/o fatigue and HGB of 4.5 according to his Dr.

## 2011-08-19 NOTE — ED Notes (Signed)
Lab critical Low: Hgb 4.7. Linker MD notified.

## 2011-08-19 NOTE — H&P (Signed)
[facility] History and Physical  Chris [name] ZOX:[id number] DOB: 12/07/1940 DOA: 08/19/2011   PCP: Chris Livings, MD   Chief Complaint:  Chief Complaint  Patient presents with  . Fatigue  . Anemia    HGB 4.5     HPI:  71 year old patient with history of prostate cancer, ongoing tobacco abuse was sent from his primary care physician office after he was found to have a hemoglobin of 4.5. The patient has not seen a physician in years, but his family was warned about him losing weight, eating poorly, having bilateral pedal edema. They took him to a new primary care physician who did routine blood work and severe microcytic anemia was found. Patient was sent to the emergency room where he was referred for admission for transfusion. History is obtained from patient's family as the patient himself is unable to provide history.  Review of Systems:  Positive for unintentional weight loss, positive for poor appetite, positive for difficulties with memory, very poor functional status, no recent chest pain or shortness of breath.  Patient is unable to give [place] good information about review of systems due to his memory deficits  Past Medical History  Diagnosis Date  . H/O atrial flutter     successful cardioversion,TEE guided  . Iron deficiency anemia   . H/O CHF   . Poor dentition   . COPD (chronic obstructive pulmonary disease)   . Prostate cancer 2011    seed implant   Past Surgical History  Procedure Date  . Total hip arthroplasty ,right in 2000 left in 2002    bilateral   Social History:  reports that he has been smoking Cigarettes.  He has been smoking about .5 packs per day. He does not have any smokeless tobacco history on file. He reports that he drinks alcohol. He reports that he does not use illicit drugs. Patient lives with his sister Patient has 2 living children Main support person is the patient's sister Chris [name] cell phone number [phone]   Allergies  Allergen  Reactions  . Penicillins Other (See Comments)    unknown    Family history positive for dementia   Prior to Admission medications   Medication Sig Start Date End Date Taking? Authorizing Provider  furosemide (LASIX) 40 MG tablet Take 40 mg by mouth daily.   Yes Historical Provider, MD   Physical Exam: Filed Vitals:   08/19/11 1500 08/19/11 1530 08/19/11 1547 08/19/11 1600  BP: 124/54 142/53 122/54 119/52  Pulse: 81 101  75  Temp:   97.7 F (36.5 C) 98.2 F (36.8 C)  TempSrc:   Oral Oral  Resp: 16   15  SpO2: 100% 96%  100%     General:  Alert, oriented to place only  Eyes: Pupil equal and round react to light accommodation extraocular movements intact  ENT: No teeth  Neck: No JVD  Cardiovascular: Regular rate and rhythm, 4/6 systolic murmur best heard at the apex  Respiratory: Clear to auscultation bilaterally  Abdomen: Soft, no palpable masses,  tenderness around the navel  Skin: Dry  Musculoskeletal: Severe bitemporal muscle atrophy  Psychiatric: Unable to recall 3 words at one minute  Neurologic: Cranial nerves 2-12 intact, strength 4/5 all 4, sensation intact  Labs on Admission:  Basic Metabolic Panel:  Lab 08/19/11 8295  NA 136  K 4.3  CL 101  CO2 24  GLUCOSE 97  BUN 13  CREATININE 0.73  CALCIUM 9.5  MG --  PHOS --   Liver  Function Tests:  Lab 08/19/11 1100  AST 17  ALT 10  ALKPHOS 71  BILITOT 0.2*  PROT 8.3  ALBUMIN 3.5   No results found for this basename: LIPASE:5,AMYLASE:5 in the last 168 hours No results found for this basename: AMMONIA:5 in the last 168 hours CBC:  Lab 08/19/11 1100  WBC 4.7  NEUTROABS 3.1  HGB 4.7*  HCT 17.2*  MCV 62.8*  PLT 316   Cardiac Enzymes: No results found for this basename: CKTOTAL:5,CKMB:5,CKMBINDEX:5,TROPONINI:5 in the last 168 hours  BNP (last 3 results) No results found for this basename: PROBNP:3 in the last 8760 hours CBG: No results found for this basename: GLUCAP:5 in the last 168  hours  Radiological Exams on Admission: No results found.  EKG: Independently reviewed. Normal sinus rhythm  Assessment/Plan Principal Problem:  *Iron deficiency anemia- progressive with fatigue/ heme negative Active Problems:  H/O atrial flutter- previously on Coumadin and Amiodarone 2010  H/O CHF- normal LVEF per ECHO 2011  COPD (chronic obstructive pulmonary disease)  Prostate cancer  Mitral valve regurgitation-moderate to severe   1. Severe anemia with microcytosis and unintentional weight loss. Highly suspicious for colon cancer. Less likely but not impossible patient may have nutritional iron deficiency versus chronic GI bleed from AVMs. Since the patient has been noncompliant with followup in the past might be prudent to perform colonoscopy in the hospital. Patient will be transfused 2 units of packed red blood cells, anemia panel has been ordered, and we'll followup CBC tomorrow. 2. History of prostate cancer-we'll check a PSA 3. History of mitral valve regurgitation-seems well compensated without treatment 4. Tobacco abuse-counseled  Code Status: Full Family Communication: Sister Disposition Plan: Unclear  Time spent: One hour  Chris [name] [facility] Pager [phone]  If 7PM-7AM, please contact night-coverage [email or web address] Password [facility] 08/19/2011, 4:54 PM

## 2011-08-19 NOTE — Progress Notes (Addendum)
Disposition Note  Chris [name], is a 71 y.o. male,   MRN: [id number]  -  DOB - 08/26/1940  Outpatient Primary MD for the patient is HASSAN,SAMI, MD   Blood pressure 119/49, pulse 89, temperature 97.6 F (36.4 C), temperature source Oral, resp. rate 18, SpO2 100.00%.  Principal Problem:  *Iron deficiency anemia- progressive with fatigue/ heme negative Active Problems:  H/O atrial flutter- previously on Coumadin and Amiodarone 2010  Prostate cancer  H/O CHF- normal LVEF per ECHO 2011  COPD (chronic obstructive pulmonary disease)  Mitral valve regurgitation-moderate to severe   71 year old male patient. Has recently established with new primary care physician as of 08/11/2011. Was actually evaluated by primary care physician on 08/18/2011. Laboratory data was obtained. Patient was called this morning because hemoglobin was low and was instructed to present to the emergency department for further evaluation and treatment. Hemoglobin repeated in the ER was still low at 4.7. Was heme-negative x1 check. EKG showed no evidence of any ischemic changes and demonstrated sinus rhythm. Patient otherwise hemodynamically stable. Primary complaint has been of fatigue. Attempted to call the primary care physician's office to help clarify reason for office visit on 08/18/2011 but no answer. ER physician has ordered 2 units of packed red blood cells to be transfused today. In review of patient's old records he has previous history of treatment for prostate cancer (T1cNxMO) in 2011 with subsequent placement of the radiation seeds. In addition he has a documented history in 2010 of atrial flutter and he was started on Coumadin and amiodarone. He also has history of severe mitral regurgitation seen on echocardiogram during 2010 admission. It is important to note that patient's INR today is 1.58 and his home medication reconciliation sheet only lists Lasix as a medication. It is unclear if the patient remains on  Coumadin or amiodarone. I discussed this patient with Dr. Lavera Guise who will be admitting this patient and plans are to admit as observation status since no signs of active GI bleeding and this may be an evolution of a chronic anemia. I did notify the emergency room a desire for medical bed observation status. I also notified the flow manager of this request as well. I have also asked the ER staff to check a pre-blood transfusion anemia panel and a TSH. Please note I did not interview or examine this patient.  Junious Silk, ANP

## 2011-08-19 NOTE — Consult Note (Addendum)
Referring Provider: Dr. Lavera Guise Primary Care Physician:  [name], MD Primary Gastroenterologist:  Gentry Fitz  Reason for Consultation:  Anemia  HPI: Chris [name] is a 71 y.o. male admitted for Hgb 4.7 without any history of melena, hematochezia, hematemesis, N/V/abdominal pain or dizziness. His sister reports weight loss of 25 pounds in the past few months and he agrees with that although the duration is unclear to them. He has never had a colonoscopy or upper endoscopy. Rare NSAIDs. Occasional beer of no more than one per day. History of radiation seeds for prostate cancer.  Past Medical History  Diagnosis Date  . H/O atrial flutter     successful cardioversion,TEE guided  . Iron deficiency anemia   . H/O CHF   . Poor dentition   . COPD (chronic obstructive pulmonary disease)   . Prostate cancer 2011    seed implant    Past Surgical History  Procedure Date  . Total hip arthroplasty ,right in 2000 left in 2002    bilateral    Prior to Admission medications   Medication Sig Start Date End Date Taking? Authorizing Provider  furosemide (LASIX) 40 MG tablet Take 40 mg by mouth daily.   Yes Historical Provider, MD    Scheduled Meds:   . sodium chloride  3 mL Intravenous Q12H   Continuous Infusions:  PRN Meds:.acetaminophen, acetaminophen, alum & mag hydroxide-simeth, ondansetron (ZOFRAN) IV, ondansetron  Allergies as of 08/19/2011 - Review Complete 08/19/2011  Allergen Reaction Noted  . Penicillins Other (See Comments) 05/28/2011    No family history on file.  History   Social History  . Marital Status: Divorced    Spouse Name: N/A    Number of Children: N/A  . Years of Education: N/A   Occupational History  . Not on file.   Social History Main Topics  . Smoking status: Current Everyday Smoker -- 0.5 packs/day    Types: Cigarettes  . Smokeless tobacco: Not on file  . Alcohol Use: Yes  . Drug Use: No  . Sexually Active: Not on file   Other Topics  Concern  . Not on file   Social History Narrative   Lives with sister Chris [name] cell [phone]    Review of Systems: 10 point system reviewed and all negative except as stated above in HPI.  Physical Exam: Vital signs: Filed Vitals:   08/19/11 1708  BP: 131/68  Pulse: 78  Temp: 97.4 F (36.3 C)  Resp: 20     General:  Elderly, frail, thin, pleasant and cooperative in NAD Lungs:  Clear throughout to auscultation.   No wheezes, crackles, or rhonchi. No acute distress. Heart:  Regular rate and rhythm; no murmurs, clicks, rubs,  or gallops. Abdomen: periumbilical tenderness with guarding, firm, mild distention, +BS  Rectal:  Deferred (until colonoscopy)  GI:  Lab Results:  Basename 08/19/11 1100  WBC 4.7  HGB 4.7*  HCT 17.2*  PLT 316   BMET  Basename 08/19/11 1100  NA 136  K 4.3  CL 101  CO2 24  GLUCOSE 97  BUN 13  CREATININE 0.73  CALCIUM 9.5   LFT  Basename 08/19/11 1100  PROT 8.3  ALBUMIN 3.5  AST 17  ALT 10  ALKPHOS 71  BILITOT 0.2*  BILIDIR --  IBILI --   PT/INR  Basename 08/19/11 1130  LABPROT 19.2*  INR 1.58*     Studies/Results: No results found.  Impression/Plan: 71yo with unintentional weight loss with severe microcytic anemia (Hgb 4.7) concerning for colorectal  cancer. Patient needs EGD in addition to colonoscopy. Agree with transfusion. Will prep tomorrow for colonoscopy/EGD to be done on Friday.     LOS: 0 days   Alta Shober C.  08/19/2011, 5:54 PM

## 2011-08-19 NOTE — Progress Notes (Signed)
Patient Alert and oriented  With periods of confusion. Patient from home with sister [name]. Per sister [name] patient uses a cane every once in a while when his hips are bothering him.  Patient oriented to room and watch the safety video.

## 2011-08-19 NOTE — ED Provider Notes (Signed)
History     CSN: [id number]  Arrival date & time 08/19/11  1044   First MD Initiated Contact with Patient 08/19/11 1047      Chief Complaint  Patient presents with  . Fatigue  . Anemia    HGB 4.5    (Consider location/radiation/quality/duration/timing/severity/associated sxs/prior treatment) HPI Comments: History obtained from both patient and his sister who he lives with.  Both patient and sister are poor historians.  Patient comes in to the ED today after being called by his PCP this morning and told that his hemoglobin was 4.5.  Patient reports that he had blood work drawn by his PCP yesterday.  Both patient and sister unaware of what the patient saw the PCP for initially.  PCP is [employer].  Patient denies any shortness of breath, dizziness, lightheadedness, chest pain, nausea or vomiting.  He has not noticed any blood in his stools or melena.  He had been diagnosed with mild iron deficiency anemia in the past.  Review of the chart shows that in the past he had been on Iron, but he is not on Iron at this time.  Review of the chart also shows that he has been on Coumadin in the past for Atrial Fibrillation.  However, patient denies being on Coumadin at this time.  Patient does not think that he has ever had a Colonoscopy.  No known prior history of GI bleed.  He does have a PMH of Prostate Cancer that was diagnosed and treated in 2011.    The history is provided by the patient.    Past Medical History  Diagnosis Date  . H/O atrial flutter     successful cardioversion,TEE guided  . Iron deficiency anemia   . H/O CHF   . Poor dentition   . COPD (chronic obstructive pulmonary disease)   . Prostate cancer 2011    seed implant    Past Surgical History  Procedure Date  . Total hip arthroplasty ,right in 2000 left in 2002    bilateral    No family history on file.  History  Substance Use Topics  . Smoking status: Current Everyday Smoker -- 0.5 packs/day    Types:  Cigarettes  . Smokeless tobacco: Not on file  . Alcohol Use: Yes      Review of Systems  Constitutional: Positive for fatigue. Negative for fever and chills.  Respiratory: Negative for shortness of breath.   Cardiovascular: Negative for chest pain.  Gastrointestinal: Negative for nausea, vomiting, abdominal pain, blood in stool and anal bleeding.  Neurological: Negative for dizziness, syncope and light-headedness.    Allergies  Penicillins  Home Medications   Current Outpatient Rx  Name Route Sig Dispense Refill  . AMIODARONE HCL 200 MG PO TABS Oral Take 200 mg by mouth daily.    Marland Kitchen DIGOXIN 0.125 MG PO TABS Oral Take 125 mcg by mouth daily.    Marland Kitchen FERROUS SULFATE 325 (65 FE) MG PO TABS Oral Take 325 mg by mouth 2 (two) times daily.    . FUROSEMIDE 40 MG PO TABS Oral Take 40 mg by mouth daily.    Marland Kitchen METOPROLOL TARTRATE 25 MG PO TABS Oral Take 25 mg by mouth daily. Taking 12.5 mg bid      BP 124/55  Pulse 87  Temp 98.2 F (36.8 C) (Oral)  Resp 18  SpO2 100%  Physical Exam  Nursing note and vitals reviewed. Constitutional: He appears well-developed and well-nourished. No distress.  HENT:  Head:  Normocephalic and atraumatic.  Mouth/Throat: Oropharynx is clear and moist.  Neck: Normal range of motion. Neck supple.  Cardiovascular: Normal rate, regular rhythm and normal heart sounds.   Pulmonary/Chest: Effort normal and breath sounds normal. No respiratory distress. He has no wheezes.  Abdominal: Soft. Bowel sounds are normal. There is no tenderness.  Genitourinary: Rectum normal. Rectal exam shows no external hemorrhoid, no internal hemorrhoid, no fissure and no mass. Guaiac negative stool.  Musculoskeletal: Normal range of motion.  Neurological: He is alert.  Skin: Skin is warm and dry. He is not diaphoretic.  Psychiatric: He has a normal mood and affect.    ED Course  Procedures (including critical care time)   Labs Reviewed  CBC WITH DIFFERENTIAL  COMPREHENSIVE  METABOLIC PANEL  TYPE AND SCREEN   No results found.   No diagnosis found.   Date: 08/19/2011  Rate: 88  Rhythm: normal sinus rhythm  QRS Axis: normal  Intervals: normal  ST/T Wave abnormalities: normal  Conduction Disutrbances:none  Narrative Interpretation:   Old EKG Reviewed: unchanged    MDM  Patient found to have a hemoglobin of 4.7 while in the ED.  Patient had stable vital signs.  Hemoccult negative.  Patient is currently not taking any known anticoagulants.    Patient started with 2 units of PRBC while in the ED and admitted to hospitalist service.        Pascal Lux Devine, PA-C 08/19/11 [phone]

## 2011-08-20 DIAGNOSIS — J449 Chronic obstructive pulmonary disease, unspecified: Secondary | ICD-10-CM

## 2011-08-20 DIAGNOSIS — F0390 Unspecified dementia without behavioral disturbance: Secondary | ICD-10-CM

## 2011-08-20 LAB — IRON AND TIBC
Iron: 330 ug/dL — ABNORMAL HIGH (ref 42–135)
Saturation Ratios: 86 % — ABNORMAL HIGH (ref 20–55)
TIBC: 384 ug/dL (ref 215–435)

## 2011-08-20 LAB — CBC
Hemoglobin: 8 g/dL — ABNORMAL LOW (ref 13.0–17.0)
MCH: 20.8 pg — ABNORMAL LOW (ref 26.0–34.0)
MCV: 69 fL — ABNORMAL LOW (ref 78.0–100.0)
Platelets: 325 10*3/uL (ref 150–400)
RBC: 3.84 MIL/uL — ABNORMAL LOW (ref 4.22–5.81)
WBC: 5.4 10*3/uL (ref 4.0–10.5)

## 2011-08-20 LAB — BASIC METABOLIC PANEL
CO2: 22 mEq/L (ref 19–32)
Glucose, Bld: 77 mg/dL (ref 70–99)
Potassium: 3.9 mEq/L (ref 3.5–5.1)
Sodium: 134 mEq/L — ABNORMAL LOW (ref 135–145)

## 2011-08-20 LAB — FERRITIN: Ferritin: 15 ng/mL — ABNORMAL LOW (ref 22–322)

## 2011-08-20 LAB — TSH: TSH: 1.765 u[IU]/mL (ref 0.350–4.500)

## 2011-08-20 LAB — PREPARE RBC (CROSSMATCH)

## 2011-08-20 LAB — PSA: PSA: 0.07 ng/mL — ABNORMAL LOW (ref <=4.00)

## 2011-08-20 LAB — VITAMIN B12: Vitamin B-12: 312 pg/mL (ref 211–911)

## 2011-08-20 NOTE — Progress Notes (Signed)
1300 Patient prep started as ordered  [place]

## 2011-08-20 NOTE — Progress Notes (Signed)
INITIAL ADULT NUTRITION ASSESSMENT Date: 08/20/2011   Time: 9:10 AM Reason for Assessment: Nutrition Risk   INTERVENTION: 1. Once diet advances, Ensure Complete po BID, each supplement provides 350 kcal and 13 grams of protein. 2. RD will continue to follow   ASSESSMENT: Male 71 y.o.  Dx: Iron deficiency anemia  Hx:  Past Medical History  Diagnosis Date  . Iron deficiency anemia   . Poor dentition   . COPD (chronic obstructive pulmonary disease)   . Atrial flutter     h/o; successful cardioversion,TEE guided  . CHF (congestive heart failure)   . History of blood transfusion 08/19/2011    "first time"  . Prostate cancer 2011    seed implant    Related Meds:     . pneumococcal 23 valent vaccine  0.5 mL Intramuscular Tomorrow-1000  . polyethylene glycol-electrolytes  4,000 mL Oral Once  . sodium chloride  3 mL Intravenous Q12H     Ht: 5\' 10"  (177.8 cm)  Wt: 133 lb 3.2 oz (60.419 kg)  Ideal Wt: 75.5 kg  % Ideal Wt: 80%  Usual Wt: 165 lbs per pt report "several weeks ago"  Wt Readings from Last 5 Encounters:  08/20/11 133 lb 3.2 oz (60.419 kg)    % Usual Wt: 81%  Body mass index is 19.11 kg/(m^2). Pt is WNL per current BMI   Food/Nutrition Related Hx: unintentional weight loss per nutrition risk assessment completed on admission   Labs:  CMP     Component Value Date/Time   NA 134* 08/20/2011 0630   K 3.9 08/20/2011 0630   CL 101 08/20/2011 0630   CO2 22 08/20/2011 0630   GLUCOSE 77 08/20/2011 0630   BUN 9 08/20/2011 0630   CREATININE 0.63 08/20/2011 0630   CALCIUM 9.3 08/20/2011 0630   PROT 8.3 08/19/2011 1100   ALBUMIN 3.5 08/19/2011 1100   AST 17 08/19/2011 1100   ALT 10 08/19/2011 1100   ALKPHOS 71 08/19/2011 1100   BILITOT 0.2* 08/19/2011 1100   GFRNONAA >90 08/20/2011 0630   GFRAA >90 08/20/2011 0630    Intake/Output Summary (Last 24 hours) at 08/20/11 0912 Last data filed at 08/20/11 0144  Gross per 24 hour  Intake    950 ml  Output    800 ml  Net     150 ml     Diet Order: Clear Liquid  Supplements/Tube Feeding: none  IVF:    Estimated Nutritional Needs:   Kcal: 1700-1900 Protein: 60-70 gm  Fluid:  1.7 - 1.9 L   Pt admitted from MD office where he was found to have low Hgb. Pt's family took him to the MD's office because of recent weight loss, poor appetite, and BLE edema.  Per notes pt with 25 lb weight loss over several months.   Pt reports that he has been eating his normal amount and denies weight loss, but states that his UBW is 165 lbs. Per notes pt with some memory deficits, no family available to talk with at time of RD visit.  If pt's reported UBW is accurate pt with 32 lb weight loss, (19.4%), over an unknown amount of time, likely a few months per GI notes. This is significant weight loss, likely severe.  Pt with some mild to moderate orbital wasting noted.   Pt meets criteria for moderate malnutrition in the context of chronic illness vs social or environmental circumstances 2/2 weight loss of 19.4% in less then one year and mild to moderate orbital wasting.  NUTRITION DIAGNOSIS: -Malnutrition (NI-5.2).  Status: Ongoing  RELATED TO: ? Colorectal cancer   AS EVIDENCE BY: weight loss and orbital wasting   MONITORING/EVALUATION(Goals): Goal: PO intake to provide >90% of estimated nutrition needs once diet advanced Monitor: Diet advance, weight, labs, I/O's  EDUCATION NEEDS: -No education needs identified at this time   DOCUMENTATION CODES Per approved criteria  -Severe malnutrition in the context of chronic illness    Clarene Duke RD, LDN Pager [phone] After Hours pager [phone]

## 2011-08-20 NOTE — Care Management Note (Addendum)
    Page 1 of 2   08/21/2011     2:29:14 PM   CARE MANAGEMENT NOTE 08/21/2011  Patient:  Chris [name], Chris [name]   Account Number:  [id number]  Date Initiated:  08/20/2011  Documentation initiated by:  Letha Cape  Subjective/Objective Assessment:   dx iron deficiency anemia  admit- lives with sister.     Action/Plan:   pt transfused prbcs 8/15  8/16 colonoscopy  pt eval   Anticipated DC Date:  08/21/2011   Anticipated DC Plan:  HOME W HOME HEALTH SERVICES  In-house referral  Clinical Social Worker      DC Planning Services  CM consult      [facility] Choice  HOME HEALTH   Choice offered to / List presented to:  C-5 Sibling        HH arranged  HH-2 PT  HH-1 RN      [facility] agency  [facility]   Status of service:  Completed, signed off Medicare Important Message given?   (If response is "NO", the following Medicare IM given date fields will be blank) Date Medicare IM given:   Date Additional Medicare IM given:    Discharge Disposition:  HOME W HOME HEALTH SERVICES  Per UR Regulation:  Reviewed for med. necessity/level of care/duration of stay  If discussed at Long Length of Stay Meetings, dates discussed:    Comments:  08/21/11 13:51 Letha Cape RN, BSN [phone] per physical therapy recs hhpt.  Patient's sister stated they want to work with [facility] , referral made to [facility] for hhpt, [facility] for medication management, Hilda Lias notified.  Soc will start 24-48 hrs post discharge. Patient is for dc today if ct scan of  whole body is ok.  08/20/11 10:00 Letha Cape RN, BSN [phone] patient lives with sister, await pt eval, may need snf. Patient was transfused today and patient for colonosocopy tomorrow.

## 2011-08-20 NOTE — Progress Notes (Signed)
[facility] PROGRESS NOTE  Chris [name] NFA:[id number] DOB: 03-Feb-1940 DOA: 08/19/2011 PCP: [name], MD  Assessment/Plan: Principal Problem:  *Iron deficiency anemia- progressive with fatigue/ heme negative Active Problems:  H/O atrial flutter- previously on Coumadin and Amiodarone 2010  H/O CHF- normal LVEF per ECHO 2011  Poor dentition  COPD (chronic obstructive pulmonary disease)  Prostate cancer  Mitral valve regurgitation-moderate to severe  Protein-calorie malnutrition, severe  Unintentional weight loss  Dementia  1. Severe iron deficiency anemia with presumed chronic gastrointestinal bleeding - status post transfusion of 3 units of packed red blood cells. Continue to follow CBCs closely. Plan for colonoscopy August 16 2. History of cardiomyopathy-fairly compensated and stable 3. History of prostate cancer. PSA is  0.07 not suggestive of recurrent disease 4. Severe protein caloric malnutrition Will get dietitian consult  Code Status: full Family Communication: sister Disposition Plan: home   Brief narrative: 71 year old man admitted with severe anemia and failure to thrive  Consultants:  gastroenterology  Procedures:  Blood transfusions  Antibiotics:  none  HPI/Subjective: No complaints  Objective: Filed Vitals:   08/20/11 1245 08/20/11 1340 08/20/11 1445 08/20/11 1515  BP: 107/51 110/60 112/63 109/61  Pulse: 82 78 76 83  Temp: 97.7 F (36.5 C) 98 F (36.7 C) 97.5 F (36.4 C) 97.7 F (36.5 C)  TempSrc: Oral Oral Oral Oral  Resp: 18 18 18 18   Height:      Weight:      SpO2: 100% 100% 100% 100%    Intake/Output Summary (Last 24 hours) at 08/20/11 1737 Last data filed at 08/20/11 1515  Gross per 24 hour  Intake 1792.5 ml  Output   1125 ml  Net  667.5 ml   Filed Weights   08/20/11 0622  Weight: 60.419 kg (133 lb 3.2 oz)    Exam:   General:  Alert and oriented to sister  Cardiovascular: regular rate and  rhythm  Respiratory: clear to auscultation bilaterally  Abdomen: soft with minimal epigastric tenderness  Data Reviewed: Basic Metabolic Panel:  Lab 08/20/11 4696 08/19/11 1100  NA 134* 136  K 3.9 4.3  CL 101 101  CO2 22 24  GLUCOSE 77 97  BUN 9 13  CREATININE 0.63 0.73  CALCIUM 9.3 9.5  MG -- --  PHOS -- --   Liver Function Tests:  Lab 08/19/11 1100  AST 17  ALT 10  ALKPHOS 71  BILITOT 0.2*  PROT 8.3  ALBUMIN 3.5   No results found for this basename: LIPASE:5,AMYLASE:5 in the last 168 hours No results found for this basename: AMMONIA:5 in the last 168 hours CBC:  Lab 08/20/11 0630 08/19/11 1100  WBC 5.4 4.7  NEUTROABS -- 3.1  HGB 8.0* 4.7*  HCT 26.5* 17.2*  MCV 69.0* 62.8*  PLT 325 316   Cardiac Enzymes: No results found for this basename: CKTOTAL:5,CKMB:5,CKMBINDEX:5,TROPONINI:5 in the last 168 hours BNP (last 3 results) No results found for this basename: PROBNP:3 in the last 8760 hours CBG: No results found for this basename: GLUCAP:5 in the last 168 hours  No results found for this or any previous visit (from the past 240 hour(s)).   Studies: Dg Chest 2 View  08/11/2011  *RADIOLOGY REPORT*  Clinical Data: Chest pain and shortness of breath.  CHEST - 2 VIEW  Comparison: 12/02/2008  Findings: Heart size and vascularity are normal and the lungs are clear.  No effusions.  No acute osseous abnormalities.  IMPRESSION: No acute disease.  Original Report Authenticated By: Gwynn Burly,  M.D.    Scheduled Meds:   . pneumococcal 23 valent vaccine  0.5 mL Intramuscular Tomorrow-1000  . polyethylene glycol-electrolytes  4,000 mL Oral Once  . sodium chloride  3 mL Intravenous Q12H   Continuous Infusions:   Principal Problem:  *Iron deficiency anemia- progressive with fatigue/ heme negative Active Problems:  H/O atrial flutter- previously on Coumadin and Amiodarone 2010  H/O CHF- normal LVEF per ECHO 2011  Poor dentition  COPD (chronic obstructive  pulmonary disease)  Prostate cancer  Mitral valve regurgitation-moderate to severe  Protein-calorie malnutrition, severe  Unintentional weight loss  Dementia    Time spent: 40 minutes    Chris [name]  [facility] Pager [phone]. If 8PM-8AM, please contact night-coverage at [email or web address], password [email or web address] 08/20/2011, 5:37 PM  LOS: 1 day

## 2011-08-20 NOTE — Evaluation (Signed)
Physical Therapy Evaluation Patient Details Name: Chris [name] MRN: [id number] DOB: 1940/09/19 Today's Date: 08/20/2011 Time: 4098-1191 PT Time Calculation (min): 24 min  PT Assessment / Plan / Recommendation Clinical Impression  Pt admitted with anemia and weight loss and currently receiving blood and prepping for colonoscopy tomorrow. Pt pleasant with cognitive deficits. Pt able to mobilize in hall and sister states she feels comfortable with pt returning home to continue to provide care for pt. Will follow pt acutely to maximize gait, transfers and safety prior to discharge to decrease burden of care.     PT Assessment  Patient needs continued PT services    Follow Up Recommendations  Home health PT;Other (comment) (aide may be beneficial since pt lacking cognition to perform bathing consistently per sister)    Barriers to Discharge None      Equipment Recommendations  None recommended by PT    Recommendations for Other Services     Frequency Min 3X/week    Precautions / Restrictions Precautions Precautions: Fall   Pertinent Vitals/Pain No pain      Mobility  Bed Mobility Bed Mobility: Supine to Sit;Sitting - Scoot to Edge of Bed Supine to Sit: 5: Supervision;HOB flat;With rails Sitting - Scoot to Edge of Bed: 5: Supervision Details for Bed Mobility Assistance: supervision for lines and safety Transfers Transfers: Sit to Stand;Stand to Sit Sit to Stand: From bed;From chair/3-in-1 Stand to Sit: To chair/3-in-1 Details for Transfer Assistance: cueing for safety with transfers Ambulation/Gait Ambulation/Gait Assistance: 4: Min guard Ambulation Distance (Feet): 250 Feet Assistive device: None Ambulation/Gait Assistance Details: decreased step length slightly less than foot length with decreased speed and cueing for directions back to room  Gait Pattern: Step-through pattern;Decreased stride length;Trunk flexed Gait velocity: 63ft/28sec=1.07 Stairs: No    Exercises      PT Diagnosis: Difficulty walking  PT Problem List: Decreased activity tolerance;Decreased mobility;Decreased cognition;Decreased safety awareness PT Treatment Interventions: Gait training;Stair training;Functional mobility training;Therapeutic activities;Therapeutic exercise;Patient/family education   PT Goals Acute Rehab PT Goals PT Goal Formulation: With patient/family Time For Goal Achievement: 09/03/11 Potential to Achieve Goals: Good Pt will go Sit to Stand: with modified independence PT Goal: Sit to Stand - Progress: Goal set today Pt will go Stand to Sit: with modified independence PT Goal: Stand to Sit - Progress: Goal set today Pt will Ambulate: with modified independence;with least restrictive assistive device;>150 feet PT Goal: Ambulate - Progress: Goal set today Pt will Go Up / Down Stairs: 3-5 stairs;with supervision;with least restrictive assistive device PT Goal: Up/Down Stairs - Progress: Goal set today  Visit Information  Last PT Received On: 08/20/11 Assistance Needed: +1    Subjective Data  Subjective: "I need my hat to walk" Patient Stated Goal: return home with sister   Prior Functioning  Home Living Lives With: Family (sister) Available Help at Discharge: Family;Available 24 hours/day Type of Home: House Home Access: Stairs to enter [employer] of Steps: 3 Home Layout: One level Bathroom Shower/Tub: Engineer, manufacturing systems: Standard Home Adaptive Equipment: Straight cane;Walker - rolling Prior Function Level of Independence: Needs assistance Needs Assistance: Light Housekeeping;Meal Prep Meal Prep: Total Light Housekeeping: Total Able to Take Stairs?: Yes Driving: No Vocation: Retired Comments: Per sister pt will not take a shower or get in the bath only sponge bathe at times and that can be days without bathing or changing clothes. Sister states family tries to encourage pt to bathe and change clothes but that he won't do  what they ask.  Communication Communication: No difficulties    Cognition  Overall Cognitive Status: Impaired Area of Impairment: Safety/judgement;Problem solving Arousal/Alertness: Awake/alert Orientation Level: Disoriented to;Time Behavior During Session: WFL for tasks performed Safety/Judgement: Decreased safety judgement for tasks assessed Safety/Judgement - Other Comments: pt constantly forgetting about IV recieving blood and needed cueing and assist to prevent from dislodging IV Cognition - Other Comments: Pt with difficulty recalling PLOF and unable to state reason for admission. Sister states pt has had declining cognition for quite a while.     Extremity/Trunk Assessment Right Lower Extremity Assessment RLE ROM/Strength/Tone: [facility] for tasks assessed Left Lower Extremity Assessment LLE ROM/Strength/Tone: WFL for tasks assessed   Balance    End of Session PT - End of Session Activity Tolerance: Patient tolerated treatment well Patient left: in chair;with call bell/phone within reach;with family/visitor present Nurse Communication: Mobility status  GP     Delorse Lek 08/20/2011, 2:35 PM  Delaney Meigs, PT [phone]

## 2011-08-21 ENCOUNTER — Encounter (HOSPITAL_COMMUNITY)
Admission: EM | Disposition: A | Discharge status: Home / Self Care | Payer: Self-pay | Source: Home / Self Care | Attending: Internal Medicine

## 2011-08-21 ENCOUNTER — Inpatient Hospital Stay (HOSPITAL_COMMUNITY): Payer: Medicare Other

## 2011-08-21 ENCOUNTER — Encounter (HOSPITAL_COMMUNITY): Payer: Self-pay | Admitting: Gastroenterology

## 2011-08-21 DIAGNOSIS — D649 Anemia, unspecified: Secondary | ICD-10-CM | POA: Diagnosis present

## 2011-08-21 DIAGNOSIS — D509 Iron deficiency anemia, unspecified: Secondary | ICD-10-CM

## 2011-08-21 HISTORY — PX: COLONOSCOPY: SHX5424

## 2011-08-21 HISTORY — PX: ESOPHAGOGASTRODUODENOSCOPY: SHX5428

## 2011-08-21 LAB — TYPE AND SCREEN
ABO/RH(D): B POS
Antibody Screen: NEGATIVE
Unit division: 0
Unit division: 0

## 2011-08-21 LAB — BASIC METABOLIC PANEL
CO2: 22 mEq/L (ref 19–32)
Calcium: 9.7 mg/dL (ref 8.4–10.5)
Creatinine, Ser: 0.67 mg/dL (ref 0.50–1.35)
GFR calc non Af Amer: >90 mL/min (ref >90)

## 2011-08-21 LAB — CBC
HCT: 31.7 % — ABNORMAL LOW (ref 39.0–52.0)
Hemoglobin: 10 g/dL — ABNORMAL LOW (ref 13.0–17.0)
Platelets: 339 10*3/uL (ref 150–400)
RDW: 26.5 % — ABNORMAL HIGH (ref 11.5–15.5)
WBC: 6.1 10*3/uL (ref 4.0–10.5)

## 2011-08-21 SURGERY — EGD (ESOPHAGOGASTRODUODENOSCOPY)
Anesthesia: Moderate Sedation

## 2011-08-21 MED ORDER — SODIUM CHLORIDE 0.9 % IV SOLN
INTRAVENOUS | Status: DC
  Administered 2011-08-21: 500 mL via INTRAVENOUS

## 2011-08-21 MED ORDER — FENTANYL CITRATE 0.05 MG/ML IJ SOLN
INTRAMUSCULAR | Status: DC | PRN
  Administered 2011-08-21 (×2): 12.5 ug via INTRAVENOUS
  Administered 2011-08-21: 25 ug via INTRAVENOUS

## 2011-08-21 MED ORDER — IOHEXOL 300 MG/ML  SOLN
80.0000 mL | Freq: Once | INTRAMUSCULAR | Status: AC | PRN
  Administered 2011-08-21: 80 mL via INTRAVENOUS

## 2011-08-21 MED ORDER — IOHEXOL 300 MG/ML  SOLN
20.0000 mL | INTRAMUSCULAR | Status: AC
  Administered 2011-08-21: 20 mL via ORAL

## 2011-08-21 MED ORDER — DIPHENHYDRAMINE HCL 50 MG/ML IJ SOLN
INTRAMUSCULAR | Status: AC
  Filled 2011-08-21: qty 1

## 2011-08-21 MED ORDER — MIDAZOLAM HCL 10 MG/2ML IJ SOLN
INTRAMUSCULAR | Status: DC | PRN
  Administered 2011-08-21: 2 mg via INTRAVENOUS

## 2011-08-21 MED ORDER — MIDAZOLAM HCL 5 MG/ML IJ SOLN
INTRAMUSCULAR | Status: AC
  Filled 2011-08-21: qty 2

## 2011-08-21 MED ORDER — FENTANYL CITRATE 0.05 MG/ML IJ SOLN
INTRAMUSCULAR | Status: AC
  Filled 2011-08-21: qty 2

## 2011-08-21 MED ORDER — MIDAZOLAM HCL 5 MG/5ML IJ SOLN
INTRAMUSCULAR | Status: DC | PRN
  Administered 2011-08-21: 2 mg via INTRAVENOUS
  Administered 2011-08-21 (×3): 1 mg via INTRAVENOUS

## 2011-08-21 NOTE — Progress Notes (Signed)
Clinical Child psychotherapist received referral for [facility]. Per MD and RNCM, pt with dementia and oriented to person only. Therefore, advanced directives not appropriate for patient at this time. No further social work needs identified at this time. Clinical Social Worker signing off.   Jacklynn Lewis, MSW, LCSWA  Clinical Social Work [phone]

## 2011-08-21 NOTE — Op Note (Signed)
[facility] [address] [place], [place]  [address]  COLONOSCOPY PROCEDURE REPORT  PATIENT:  Chris [name], Chris [name]  MR#:  [id number] BIRTHDATE:  08-13-1940, 71 yrs. old  GENDER:  male ENDOSCOPIST:  Charlott Rakes, MD REF. BY:   hospital team PROCEDURE DATE:  08/21/2011 PROCEDURE:  Colonoscopy with biopsies ASA CLASS:  Class III INDICATIONS:   iron deficiency anemia MEDICATIONS:  Fentanyl 50 mcg IV, Versed 5 mg IV  DESCRIPTION OF PROCEDURE:   After the risks benefits and alternatives of the procedure were thoroughly explained, informed consent was obtained.  The Pentax Colonoscope [id number], [id number] [phone]) and [id number] ([id number]) endoscope was introduced through the anus and advanced to the cecum where the ileocecal valve and appendiceal orifice were identified. In order to reach the cecum repeated loop reduction was necessary as was abdominal pressure and turning the patient in the supine position. The quality of the prep was fair.  The instrument was then slowly withdrawn as the colon was fully examined. <<PROCEDUREIMAGES>>  FINDINGS:  Rectal exam unremarkable.  Pediatric colonoscope inserted into the colon and advanced to the cecum, where the appendiceal orifice and ileocecal valve were identified. Diffuse diverticulosis was seen throughout the colon on insertion. On careful withdrawal of the colonoscope a large and small diverticula were seen throughout the colon. The rectum was nodular with a few diminutive polyps noted. Biopsies were taken of this nodular mucosa and polyps to send for histology.    Retroflexion revealed medium internal hemorrhoids.  COMPLICATIONS:  None  IMPRESSION: Diffuse diverticulosis; Medium-sized internal hemorrhoids; Nodular rectal mucosa status post biopsy; No source of anemia       noted  RECOMMENDATIONS: Proceed with EGD; F/U on path; High fiber diet  ______________________________ Charlott Rakes,  MD  CC:  n. eSIGNEDCharlott Rakes at 08/21/2011 10:20 AM  [place], Molly Maduro, [phone]

## 2011-08-21 NOTE — H&P (View-Only) (Signed)
[facility] PROGRESS NOTE  Chris [name] MRN:[id number] DOB: 11/25/1940 DOA: 08/19/2011 PCP: HASSAN,SAMI, MD  Assessment/Plan: Principal Problem:  *Iron deficiency anemia- progressive with fatigue/ heme negative Active Problems:  H/O atrial flutter- previously on Coumadin and Amiodarone 2010  H/O CHF- normal LVEF per ECHO 2011  Poor dentition  COPD (chronic obstructive pulmonary disease)  Prostate cancer  Mitral valve regurgitation-moderate to severe  Protein-calorie malnutrition, severe  Unintentional weight loss  Dementia  1. Severe iron deficiency anemia with presumed chronic gastrointestinal bleeding - status post transfusion of 3 units of packed red blood cells. Continue to follow CBCs closely. Plan for colonoscopy August 16 2. History of cardiomyopathy-fairly compensated and stable 3. History of prostate cancer. PSA is  0.07 not suggestive of recurrent disease 4. Severe protein caloric malnutrition Will get dietitian consult  Code Status: full Family Communication: sister Disposition Plan: home   Brief narrative: 71-year-old man admitted with severe anemia and failure to thrive  Consultants:  gastroenterology  Procedures:  Blood transfusions  Antibiotics:  none  HPI/Subjective: No complaints  Objective: Filed Vitals:   08/20/11 1245 08/20/11 1340 08/20/11 1445 08/20/11 1515  BP: 107/51 110/60 112/63 109/61  Pulse: 82 78 76 83  Temp: 97.7 F (36.5 C) 98 F (36.7 C) 97.5 F (36.4 C) 97.7 F (36.5 C)  TempSrc: Oral Oral Oral Oral  Resp: 18 18 18 18  Height:      Weight:      SpO2: 100% 100% 100% 100%    Intake/Output Summary (Last 24 hours) at 08/20/11 1737 Last data filed at 08/20/11 1515  Gross per 24 hour  Intake 1792.5 ml  Output   1125 ml  Net  667.5 ml   Filed Weights   08/20/11 0622  Weight: 60.419 kg (133 lb 3.2 oz)    Exam:   General:  Alert and oriented to sister  Cardiovascular: regular rate and  rhythm  Respiratory: clear to auscultation bilaterally  Abdomen: soft with minimal epigastric tenderness  Data Reviewed: Basic Metabolic Panel:  Lab 08/20/11 0630 08/19/11 1100  NA 134* 136  K 3.9 4.3  CL 101 101  CO2 22 24  GLUCOSE 77 97  BUN 9 13  CREATININE 0.63 0.73  CALCIUM 9.3 9.5  MG -- --  PHOS -- --   Liver Function Tests:  Lab 08/19/11 1100  AST 17  ALT 10  ALKPHOS 71  BILITOT 0.2*  PROT 8.3  ALBUMIN 3.5   No results found for this basename: LIPASE:5,AMYLASE:5 in the last 168 hours No results found for this basename: AMMONIA:5 in the last 168 hours CBC:  Lab 08/20/11 0630 08/19/11 1100  WBC 5.4 4.7  NEUTROABS -- 3.1  HGB 8.0* 4.7*  HCT 26.5* 17.2*  MCV 69.0* 62.8*  PLT 325 316   Cardiac Enzymes: No results found for this basename: CKTOTAL:5,CKMB:5,CKMBINDEX:5,TROPONINI:5 in the last 168 hours BNP (last 3 results) No results found for this basename: PROBNP:3 in the last 8760 hours CBG: No results found for this basename: GLUCAP:5 in the last 168 hours  No results found for this or any previous visit (from the past 240 hour(s)).   Studies: Dg Chest 2 View  08/11/2011  *RADIOLOGY REPORT*  Clinical Data: Chest pain and shortness of breath.  CHEST - 2 VIEW  Comparison: 12/02/2008  Findings: Heart size and vascularity are normal and the lungs are clear.  No effusions.  No acute osseous abnormalities.  IMPRESSION: No acute disease.  Original Report Authenticated By: JAMES H. MAXWELL,   M.D.    Scheduled Meds:   . pneumococcal 23 valent vaccine  0.5 mL Intramuscular Tomorrow-1000  . polyethylene glycol-electrolytes  4,000 mL Oral Once  . sodium chloride  3 mL Intravenous Q12H   Continuous Infusions:   Principal Problem:  *Iron deficiency anemia- progressive with fatigue/ heme negative Active Problems:  H/O atrial flutter- previously on Coumadin and Amiodarone 2010  H/O CHF- normal LVEF per ECHO 2011  Poor dentition  COPD (chronic obstructive  pulmonary disease)  Prostate cancer  Mitral valve regurgitation-moderate to severe  Protein-calorie malnutrition, severe  Unintentional weight loss  Dementia    Time spent: 40 minutes    Porchia Sinkler  [facility] Pager [phone]. If 8PM-8AM, please contact night-coverage at [email or web address], password [email or web address] 08/20/2011, 5:37 PM  LOS: 1 day              

## 2011-08-21 NOTE — Op Note (Signed)
[facility] [address] [place], [place]  [address]  ENDOSCOPY PROCEDURE REPORT  PATIENT:  Chris [name], Chris [name]  MR#:  [id number] BIRTHDATE:  1940/09/29, 71 yrs. old  GENDER:  male  ENDOSCOPIST:  Charlott Rakes, MD Referred by:   hospital team  PROCEDURE DATE:  08/21/2011 PROCEDURE:  EGD ASA CLASS:  Class III INDICATIONS:   iron deficiency anemia  MEDICATIONS:  Versed 2 mg IV, additional medicine given for preceding colonoscopy TOPICAL ANESTHETIC: None  DESCRIPTION OF PROCEDURE:   After the risks benefits and alternatives of the procedure were thoroughly explained, informed consent was obtained.  The EG-[id number] ([id number]) endoscope was introduced through the mouth and advanced to the second portion of the duodenum. Procedure was difficult due to combativeness. The instrument was slowly withdrawn as the mucosa was fully examined. <<PROCEDUREIMAGES>>  FINDINGS:  The endoscope was inserted into the oropharynx and esophagus was intubated.  The esophagus and gastroesophageal junction were normal in appearance. Endoscope was advanced into the stomach, which revealed normal-appearing gastric mucosa. The endoscope was advanced to the duodenal bulb and second portion of duodenum which were unremarkable.  The endoscope was withdrawn back into the stomach and retroflexion revealed a small hiatal hernia.  COMPLICATIONS:  None  ENDOSCOPIC IMPRESSION: Small hiatal hernia otherwise normal upper endoscopy; no source of anemia seen  RECOMMENDATIONS: Consider abdominal/pelvic CT scan as the next step but defer timing to primary team since the patient may also need a         chest CT due to his weight loss and anemia; Clear liquids  REPEAT EXAM:  N/A  ______________________________ Charlott Rakes, MD  CC:  n. eSIGNEDCharlott Rakes at 08/21/2011 10:30 AM  Leonides Sake, [phone]

## 2011-08-21 NOTE — ED Provider Notes (Signed)
Medical screening examination/treatment/procedure(s) were conducted as a shared visit with non-physician practitioner(s) and myself.  I personally evaluated the patient during the encounter  Pt has been c/o weakness and fatigue.  hgb 4.7 today, hemocult negative.  PRBC transfusion ordered, arrangements made for admission.  Pt is awake, alert, comfortable, no current distress.   Ethelda Chick, MD 08/21/11 [phone]

## 2011-08-21 NOTE — Progress Notes (Signed)
Physical Therapy Note: pt OOR for colonoscopy and unable to participate in therapy at this time. Will attempt at later date. Thanks Delaney Meigs, PT [phone]

## 2011-08-21 NOTE — Progress Notes (Signed)
[facility] PROGRESS NOTE  Chris [name] WJX:[id number] DOB: [birth year] DOA: 08/19/2011 PCP: [name], MD  Assessment/Plan: Principal Problem:  *Iron deficiency anemia- progressive with fatigue/ heme negative Active Problems:  H/O atrial flutter- previously on Coumadin and Amiodarone 2010  H/O CHF- normal LVEF per ECHO 2011  Poor dentition  COPD (chronic obstructive pulmonary disease)  Prostate cancer  Mitral valve regurgitation-moderate to severe  Protein-calorie malnutrition, severe  Unintentional weight loss  Dementia  Anemia  Iron deficiency anemia, unspecified  1. Severe iron deficiency anemia with presumed chronic gastrointestinal bleeding - status post transfusion of 3 units of packed red blood cells. Hemoglobin improved from 4.7 g on admission to 10 g on August 16. No signs of overt gastrointestinal bleeding in the hospital Continue to follow CBCs closely. Upper endoscopy and colonoscopy on August 16 failed to identify a satisfactory source for the gastrointestinal bleeding. We are pursuing a CT scan of abdomen and pelvis 2. History of cardiomyopathy-fairly compensated and stable 3. History of prostate cancer. PSA is  0.07 not suggestive of recurrent disease 4. Severe protein caloric malnutrition Will get dietitian consult  Code Status: full Family Communication: sister Disposition Plan: home   Brief narrative: 71 year old man admitted with severe anemia and failure to thrive  Consultants:  gastroenterology  Procedures:  Blood transfusions  Antibiotics:  none  HPI/Subjective: No complaints  Objective: Filed Vitals:   08/21/11 1030 08/21/11 1040 08/21/11 1050 08/21/11 1123  BP: 132/66 126/61 120/57 121/66  Pulse:    73  Temp:    97.4 F (36.3 C)  TempSrc:    Oral  Resp: 12 16 12 14   Height:      Weight:      SpO2: 100% 100% 98% 99%    Intake/Output Summary (Last 24 hours) at 08/21/11 1702 Last data filed at 08/21/11 1323  Gross per 24  hour  Intake    240 ml  Output    300 ml  Net    -60 ml   Filed Weights   08/20/11 0622  Weight: 60.419 kg (133 lb 3.2 oz)    Exam:   General:  Alert and oriented to sister  Cardiovascular: regular rate and rhythm  Respiratory: clear to auscultation bilaterally  Abdomen: soft with minimal epigastric tenderness  Data Reviewed: Basic Metabolic Panel:  Lab 08/21/11 2130 08/20/11 0630 08/19/11 1100  NA 136 134* 136  K 4.2 3.9 4.3  CL 101 101 101  CO2 22 22 24   GLUCOSE 81 77 97  BUN 7 9 13   CREATININE 0.67 0.63 0.73  CALCIUM 9.7 9.3 9.5  MG -- -- --  PHOS -- -- --   Liver Function Tests:  Lab 08/19/11 1100  AST 17  ALT 10  ALKPHOS 71  BILITOT 0.2*  PROT 8.3  ALBUMIN 3.5   No results found for this basename: LIPASE:5,AMYLASE:5 in the last 168 hours No results found for this basename: AMMONIA:5 in the last 168 hours CBC:  Lab 08/21/11 0650 08/20/11 0630 08/19/11 1100  WBC 6.1 5.4 4.7  NEUTROABS -- -- 3.1  HGB 10.0* 8.0* 4.7*  HCT 31.7* 26.5* 17.2*  MCV 71.9* 69.0* 62.8*  PLT 339 325 316   Cardiac Enzymes: No results found for this basename: CKTOTAL:5,CKMB:5,CKMBINDEX:5,TROPONINI:5 in the last 168 hours BNP (last 3 results) No results found for this basename: PROBNP:3 in the last 8760 hours CBG: No results found for this basename: GLUCAP:5 in the last 168 hours  No results found for this or any previous visit (  from the past 240 hour(s)).   Studies: Dg Chest 2 View  08/11/2011  *RADIOLOGY REPORT*  Clinical Data: Chest pain and shortness of breath.  CHEST - 2 VIEW  Comparison: 12/02/2008  Findings: Heart size and vascularity are normal and the lungs are clear.  No effusions.  No acute osseous abnormalities.  IMPRESSION: No acute disease.  Original Report Authenticated By: Gwynn Burly, M.D.    Scheduled Meds:    . iohexol  20 mL Oral Q1 Hr x 2  . polyethylene glycol-electrolytes  4,000 mL Oral Once  . sodium chloride  3 mL Intravenous Q12H    Continuous Infusions:    . DISCONTD: sodium chloride 500 mL (08/21/11 0759)    Principal Problem:  *Iron deficiency anemia- progressive with fatigue/ heme negative Active Problems:  H/O atrial flutter- previously on Coumadin and Amiodarone 2010  H/O CHF- normal LVEF per ECHO 2011  Poor dentition  COPD (chronic obstructive pulmonary disease)  Prostate cancer  Mitral valve regurgitation-moderate to severe  Protein-calorie malnutrition, severe  Unintentional weight loss  Dementia  Anemia  Iron deficiency anemia, unspecified    Time spent: 40 minutes    [name]  [facility] Pager [phone]. If 8PM-8AM, please contact night-coverage at [email or web address], password [email or web address] 08/21/2011, 5:02 PM  LOS: 2 days

## 2011-08-21 NOTE — Brief Op Note (Signed)
EGD normal. Colon unrevealing for source of anemia. See endopro for details. CT abd/pelvis recommended but defer to primary team whether chest CT also needed due to anemia and weight loss. CT not ordered and will defer to primary team. Dr. Evette Cristal to see this weekend.

## 2011-08-21 NOTE — Progress Notes (Signed)
08/21/11 Patient is pending discharge home after CT is completed. Chris [name]

## 2011-08-21 NOTE — Interval H&P Note (Signed)
History and Physical Interval Note:  08/21/2011 9:19 AM  Chris [name]  has presented today for surgery, with the diagnosis of Anemia  The various methods of treatment have been discussed with the patient and family. After consideration of risks, benefits and other options for treatment, the patient has consented to  Procedure(s) (LRB): ESOPHAGOGASTRODUODENOSCOPY (EGD) (N/A) COLONOSCOPY (N/A) as a surgical intervention .  The patient's history has been reviewed, patient examined, no change in status, stable for surgery.  I have reviewed the patient's chart and labs.  Questions were answered to the patient's satisfaction.     [name] C.

## 2011-08-22 LAB — BASIC METABOLIC PANEL
Calcium: 9.4 mg/dL (ref 8.4–10.5)
GFR calc non Af Amer: >90 mL/min (ref >90)
Glucose, Bld: 95 mg/dL (ref 70–99)
Potassium: 4.2 mEq/L (ref 3.5–5.1)
Sodium: 131 mEq/L — ABNORMAL LOW (ref 135–145)

## 2011-08-22 LAB — CBC
Hemoglobin: 9.9 g/dL — ABNORMAL LOW (ref 13.0–17.0)
MCH: 22 pg — ABNORMAL LOW (ref 26.0–34.0)
MCHC: 30.6 g/dL (ref 30.0–36.0)
Platelets: 341 10*3/uL (ref 150–400)
RBC: 4.49 MIL/uL (ref 4.22–5.81)

## 2011-08-22 MED ORDER — FERROUS FUM-IRON POLYSACCH-FA 162-115.2-1 MG PO CAPS: 1.0000 | ORAL_CAPSULE | Freq: Every day | ORAL | Status: DC

## 2011-08-22 NOTE — Progress Notes (Signed)
08/22/11 Patient is scheduled to be discharged home today, Pt has not urinated since f/c was removed this am. He may need to go home with catheter. Valeda Malm

## 2011-08-22 NOTE — Discharge Summary (Signed)
Physician Discharge Summary  Chris [name] GEX:[id number] DOB: [birth year] DOA: 08/19/2011  PCP: [name], MD  Admit date: 08/19/2011 Discharge date: 08/22/2011  Recommendations for Outpatient Follow-up:  1. Followup CBC  Discharge Diagnoses:  Iron deficiency anemia- progressive with fatigue/ heme negative - workup with upper endoscopy, colonoscopy, CT scan of the entire body did not indicate any malignancy  H/O atrial flutter- previously on Coumadin and Amiodarone 2010  H/O CHF- normal LVEF per ECHO 2011  Poor dentition  COPD (chronic obstructive pulmonary disease)  Prostate cancer status post radioactive seed implantation-followup PSA 0.07  Mitral valve regurgitation-moderate to severe  Protein-calorie malnutrition, severe  Unintentional weight loss  Dementia  Anemia  Iron deficiency anemia, unspecified   Discharge Condition: Good  Diet recommendation: Regular diet  Filed Weights   08/20/11 0622  Weight: 60.419 kg (133 lb 3.2 oz)    History of present illness:  71 year old patient with history of prostate cancer, ongoing tobacco abuse was sent from his primary care physician office after he was found to have a hemoglobin of 4.5. The patient has not seen a physician in years, but his family was worried about him losing weight, eating poorly, having bilateral pedal edema. They took him to a new primary care physician who did routine blood work and severe microcytic anemia was found. Patient was sent to the emergency room where he was referred for admission for transfusion. History is obtained from patient's family as the patient himself is unable to provide history.   Hospital Course:  1. Severe iron deficiency anemia with presumed chronic gastrointestinal bleeding - status post transfusion of 3 units of packed red blood cells. Hemoglobin improved from 4.7 g on admission to 10 g on August 16. No signs of overt gastrointestinal bleeding in the hospital Upper endoscopy and  colonoscopy on August 16 failed to identify a satisfactory source for the gastrointestinal bleeding.  Results for Chris [name] (MRN [id number]) as of 08/22/2011 17:09  Ref. Range 08/19/2011 21:47  Iron Latest Range: 42-135 ug/dL 644 (H)  UIBC Latest Range: 125-400 ug/dL 54 (L)  TIBC Latest Range: 215-435 ug/dL 034  Saturation Ratios Latest Range: 20-55 % 86 (H)  Ferritin Latest Range: 22-322 ng/mL 15 (L)  Folate No range found 13.5  Vitamin B-12 Latest Range: 211-911 pg/mL 312  Anemia panel indicated iron and folic acid deficiency pointing towards malnutrition and poor by mouth intake. Recommended outpatient treatment with iron folic acid and close followup of CBCs 2. History of cardiomyopathy-fairly compensated and stable 3. History of prostate cancer. PSA is 0.07 not suggestive of recurrent disease 4. Severe protein caloric malnutrition Will get dietitian consult 5. On August 21, 2011 after the colonoscopy the patient was unable to void. He required Foley catheter placement overnight. Renal function remained stable and by August 17 the patient was able to urinate again  Procedures:  Colonoscopy  Endoscopy  CT scan chest abdomen and pelvis  Consultations:  Gastroenterology  Discharge Exam: Filed Vitals:   08/22/11 0526  BP: 129/64  Pulse: 91  Temp: 97.3 F (36.3 C)  Resp: 18   Filed Vitals:   08/21/11 1050 08/21/11 1123 08/21/11 2111 08/22/11 0526  BP: 120/57 121/66 133/69 129/64  Pulse:  73 86 91  Temp:  97.4 F (36.3 C) 97.5 F (36.4 C) 97.3 F (36.3 C)  TempSrc:  Oral Oral Oral  Resp: 12 14 16 18   Height:      Weight:      SpO2: 98% 99% 100% 98%  General: Alert, oriented to family Cardiovascular: Regular rate and rhythm Respiratory: Clear to auscultation bilaterally  Discharge Instructions  Discharge Orders    Future Orders Please Complete By Expires   Diet - low sodium heart healthy      Increase activity slowly        Medication List  As of  08/22/2011 11:18 AM   STOP taking these medications         furosemide 40 MG tablet         TAKE these medications         Ferrous Fum-Iron Polysacch-FA 162-115.2-1 MG Caps   Take 1 capsule by mouth daily.           Follow-up Information    Schedule an appointment as soon as possible for a visit with [facility]          The results of significant diagnostics from this hospitalization (including imaging, microbiology, ancillary and laboratory) are listed below for reference.    Significant Diagnostic Studies: Dg Chest 2 View  08/11/2011  *RADIOLOGY REPORT*  Clinical Data: Chest pain and shortness of breath.  CHEST - 2 VIEW  Comparison: 12/02/2008  Findings: Heart size and vascularity are normal and the lungs are clear.  No effusions.  No acute osseous abnormalities.  IMPRESSION: No acute disease.  Original Report Authenticated By: Gwynn Burly, M.D.   Ct Chest W Contrast  08/21/2011  **ADDENDUM** CREATED: 08/21/2011 18:31:52  The same line is by this encases and 10 blank  **END ADDENDUM** SIGNED BY: Karn Cassis. Reche Dixon, M.D.   08/21/2011  *RADIOLOGY REPORT*  Clinical Data:  Weight loss, cough, anemia.  CT CHEST, ABDOMEN AND PELVIS WITH CONTRAST  Technique:  Multidetector CT imaging of the chest, abdomen and pelvis was performed following the standard protocol during bolus administration of intravenous contrast.  Contrast: 80mL OMNIPAQUE IOHEXOL 300 MG/ML  SOLN  Comparison:   None.  CT CHEST  Findings:  No mass or adenopathy is noted in the mediastinum. Thoracic aorta appears grossly normal.  Minimal subsegmental atelectasis or scarring is noted in the posterior portions of both lung bases.  No other significant parenchymal abnormality is noted. Bony thorax is intact.  IMPRESSION: No significant abnormality seen involving the chest.  Minimal subsegmental atelectasis or scarring seen in both lung bases.  CT ABDOMEN AND PELVIS  Findings:  The liver, spleen and pancreas appear normal.  Gallbladder appears normal.  Kidneys and adrenal glands appear normal.  Appendix is not clearly identified.  Moderate urinary bladder distension is noted.  No abscess or fluid collection is noted.  No mesenteric or retroperitoneal adenopathy is noted. Status post prostatic brachytherapy seed placement.  Status post bilateral total hip arthroplasty.  IMPRESSION: No significant acute abnormality seen in the abdomen or pelvis. Original Report Authenticated By: Venita Sheffield., M.D.   Ct Abdomen Pelvis W Contrast  08/21/2011  **ADDENDUM** CREATED: 08/21/2011 18:31:52  The same line is by this encases and 10 blank  **END ADDENDUM** SIGNED BY: Karn Cassis. Reche Dixon, M.D.   08/21/2011  *RADIOLOGY REPORT*  Clinical Data:  Weight loss, cough, anemia.  CT CHEST, ABDOMEN AND PELVIS WITH CONTRAST  Technique:  Multidetector CT imaging of the chest, abdomen and pelvis was performed following the standard protocol during bolus administration of intravenous contrast.  Contrast: 80mL OMNIPAQUE IOHEXOL 300 MG/ML  SOLN  Comparison:   None.  CT CHEST  Findings:  No mass or adenopathy is noted in the mediastinum. Thoracic aorta appears grossly  normal.  Minimal subsegmental atelectasis or scarring is noted in the posterior portions of both lung bases.  No other significant parenchymal abnormality is noted. Bony thorax is intact.  IMPRESSION: No significant abnormality seen involving the chest.  Minimal subsegmental atelectasis or scarring seen in both lung bases.  CT ABDOMEN AND PELVIS  Findings:  The liver, spleen and pancreas appear normal. Gallbladder appears normal.  Kidneys and adrenal glands appear normal.  Appendix is not clearly identified.  Moderate urinary bladder distension is noted.  No abscess or fluid collection is noted.  No mesenteric or retroperitoneal adenopathy is noted. Status post prostatic brachytherapy seed placement.  Status post bilateral total hip arthroplasty.  IMPRESSION: No significant acute abnormality seen  in the abdomen or pelvis. Original Report Authenticated By: Venita Sheffield., M.D.    Microbiology: No results found for this or any previous visit (from the past 240 hour(s)).   Labs: Basic Metabolic Panel:  Lab 08/21/11 2956 08/20/11 0630 08/19/11 1100  NA 136 134* 136  K 4.2 3.9 4.3  CL 101 101 101  CO2 22 22 24   GLUCOSE 81 77 97  BUN 7 9 13   CREATININE 0.67 0.63 0.73  CALCIUM 9.7 9.3 9.5  MG -- -- --  PHOS -- -- --   Liver Function Tests:  Lab 08/19/11 1100  AST 17  ALT 10  ALKPHOS 71  BILITOT 0.2*  PROT 8.3  ALBUMIN 3.5   No results found for this basename: LIPASE:5,AMYLASE:5 in the last 168 hours No results found for this basename: AMMONIA:5 in the last 168 hours CBC:  Lab 08/21/11 0650 08/20/11 0630 08/19/11 1100  WBC 6.1 5.4 4.7  NEUTROABS -- -- 3.1  HGB 10.0* 8.0* 4.7*  HCT 31.7* 26.5* 17.2*  MCV 71.9* 69.0* 62.8*  PLT 339 325 316   Cardiac Enzymes: No results found for this basename: CKTOTAL:5,CKMB:5,CKMBINDEX:5,TROPONINI:5 in the last 168 hours BNP: BNP (last 3 results) No results found for this basename: PROBNP:3 in the last 8760 hours CBG: No results found for this basename: GLUCAP:5 in the last 168 hours  Time coordinating discharge: 60 minutes  Signed:  Rosanna Bickle  [facility] 08/22/2011, 11:18 AM

## 2011-08-22 NOTE — Progress Notes (Signed)
No complaints. Wants to go home. EGD and colonoscopy negative for significant findings. CT abdomen and pelvis negative for significant findings. Will sign off. Call us if needed.

## 2011-08-24 ENCOUNTER — Encounter (HOSPITAL_COMMUNITY): Payer: Self-pay | Admitting: Gastroenterology

## 2011-08-24 ENCOUNTER — Encounter (HOSPITAL_COMMUNITY): Payer: Self-pay

## 2011-11-23 ENCOUNTER — Emergency Department (HOSPITAL_COMMUNITY): Payer: Medicare Other

## 2011-11-23 ENCOUNTER — Emergency Department (HOSPITAL_COMMUNITY)
Admission: EM | Admit: 2011-11-23 | Discharge: 2011-11-23 | Disposition: A | Discharge status: Home / Self Care | Payer: Medicare Other | Attending: Emergency Medicine | Admitting: Emergency Medicine

## 2011-11-23 DIAGNOSIS — F17200 Nicotine dependence, unspecified, uncomplicated: Secondary | ICD-10-CM | POA: Insufficient documentation

## 2011-11-23 DIAGNOSIS — J4489 Other specified chronic obstructive pulmonary disease: Secondary | ICD-10-CM | POA: Insufficient documentation

## 2011-11-23 DIAGNOSIS — I4892 Unspecified atrial flutter: Secondary | ICD-10-CM | POA: Insufficient documentation

## 2011-11-23 DIAGNOSIS — I509 Heart failure, unspecified: Secondary | ICD-10-CM | POA: Insufficient documentation

## 2011-11-23 DIAGNOSIS — L8992 Pressure ulcer of unspecified site, stage 2: Secondary | ICD-10-CM | POA: Insufficient documentation

## 2011-11-23 DIAGNOSIS — L8990 Pressure ulcer of unspecified site, unspecified stage: Secondary | ICD-10-CM

## 2011-11-23 DIAGNOSIS — J449 Chronic obstructive pulmonary disease, unspecified: Secondary | ICD-10-CM | POA: Insufficient documentation

## 2011-11-23 DIAGNOSIS — Z8546 Personal history of malignant neoplasm of prostate: Secondary | ICD-10-CM | POA: Insufficient documentation

## 2011-11-23 DIAGNOSIS — D509 Iron deficiency anemia, unspecified: Secondary | ICD-10-CM | POA: Insufficient documentation

## 2011-11-23 DIAGNOSIS — Z79899 Other long term (current) drug therapy: Secondary | ICD-10-CM | POA: Insufficient documentation

## 2011-11-23 LAB — CBC WITH DIFFERENTIAL/PLATELET
Basophils Relative: 0 % (ref 0–1)
Eosinophils Relative: 1 % (ref 0–5)
Hemoglobin: 9.3 g/dL — ABNORMAL LOW (ref 13.0–17.0)
Lymphocytes Relative: 22 % (ref 12–46)
MCH: 24.7 pg — ABNORMAL LOW (ref 26.0–34.0)
Monocytes Relative: 10 % (ref 3–12)
Neutrophils Relative %: 67 % (ref 43–77)
Platelets: 400 10*3/uL (ref 150–400)
RBC: 3.77 MIL/uL — ABNORMAL LOW (ref 4.22–5.81)
WBC: 6.4 10*3/uL (ref 4.0–10.5)

## 2011-11-23 LAB — COMPREHENSIVE METABOLIC PANEL
ALT: 8 U/L (ref 0–53)
AST: 16 U/L (ref 0–37)
Alkaline Phosphatase: 74 U/L (ref 39–117)
CO2: 26 mEq/L (ref 19–32)
Calcium: 9.4 mg/dL (ref 8.4–10.5)
Chloride: 105 mEq/L (ref 96–112)
GFR calc Af Amer: >90 mL/min (ref >90)
GFR calc non Af Amer: >90 mL/min (ref >90)
Glucose, Bld: 85 mg/dL (ref 70–99)
Potassium: 3.9 mEq/L (ref 3.5–5.1)
Sodium: 140 mEq/L (ref 135–145)

## 2011-11-23 MED ORDER — CEPHALEXIN 500 MG PO CAPS: 500.0000 mg | ORAL_CAPSULE | Freq: Four times a day (QID) | ORAL | Status: DC

## 2011-11-23 NOTE — ED Notes (Signed)
Patient was ambulatory with a cane in lobby.

## 2011-11-23 NOTE — ED Provider Notes (Signed)
History     CSN: [id number]  Arrival date & time 11/23/11  1144   First MD Initiated Contact with Patient 11/23/11 1405      No chief complaint on file.   (Consider location/radiation/quality/duration/timing/severity/associated sxs/prior treatment) HPI Comments: This is a 71 year old male, who presents to the emergency department with chief complaint of pressure ulcer over his right greater trochanter. Patient reports having bilateral hip replacements approximately 10 years ago. He is able to ambulate without difficulty. He does not know when the wound first appeared but guesses that it was about a month ago. He has not diabetic. He states that the wound hurts when it is touched. He has not tried anything to alleviate his symptoms.  The history is provided by the patient. No language interpreter was used.    Past Medical History  Diagnosis Date  . Iron deficiency anemia   . Poor dentition   . COPD (chronic obstructive pulmonary disease)   . Atrial flutter     h/o; successful cardioversion,TEE guided  . CHF (congestive heart failure)   . History of blood transfusion 08/19/2011    "first time"  . Prostate cancer 2011    seed implant    Past Surgical History  Procedure Date  . Total hip arthroplasty ,right in 2000 left in 2002    bilateral  . Joint replacement   . Esophagogastroduodenoscopy 08/21/2011    Procedure: ESOPHAGOGASTRODUODENOSCOPY (EGD);  Surgeon: Shirley Friar, MD;  Location: [facility] ENDOSCOPY;  Service: Endoscopy;  Laterality: N/A;  . Colonoscopy 08/21/2011    Procedure: COLONOSCOPY;  Surgeon: Shirley Friar, MD;  Location: [facility] ENDOSCOPY;  Service: Endoscopy;  Laterality: N/A;    No family history on file.  History  Substance Use Topics  . Smoking status: Current Every Day Smoker -- 1.0 packs/day for 57 years    Types: Cigarettes  . Smokeless tobacco: Never Used  . Alcohol Use: 12.6 oz/week    21 Cans of beer per week     Comment: 08/19/2011 "about 3  beers/day"      Review of Systems  All other systems reviewed and are negative.    Allergies  Penicillins  Home Medications   Current Outpatient Rx  Name  Route  Sig  Dispense  Refill  . FERROUS FUM-IRON POLYSACCH-FA 162-115.2-1 MG PO CAPS   Oral   Take 1 capsule by mouth daily.   30 each   0   . FUROSEMIDE 40 MG PO TABS   Oral   Take 40 mg by mouth daily.         Marland Kitchen HYDROCODONE-ACETAMINOPHEN 5-500 MG PO TABS   Oral   Take 1 tablet by mouth every 6 (six) hours as needed. For pain           BP 153/64  Pulse 97  Temp 97.7 F (36.5 C)  Resp 16  SpO2 97%  Physical Exam  Nursing note and vitals reviewed. Constitutional: He is oriented to person, place, and time. He appears well-developed and well-nourished.  HENT:  Head: Normocephalic and atraumatic.  Eyes: Conjunctivae normal and EOM are normal. Pupils are equal, round, and reactive to light.  Neck: Normal range of motion. Neck supple.  Cardiovascular: Normal rate, regular rhythm and normal heart sounds.   Pulmonary/Chest: Effort normal and breath sounds normal. No respiratory distress. He has no wheezes. He has no rales. He exhibits no tenderness.  Abdominal: Soft. Bowel sounds are normal.  Musculoskeletal: Normal range of motion.  Neurological: He is alert  and oriented to person, place, and time.  Skin: Skin is warm and dry.       4 x 4 stage II pressure ulcer over her right greater trochanter, no signs of cellulitis, no signs of muscle or tendon involvement.  Psychiatric: He has a normal mood and affect. His behavior is normal. Judgment and thought content normal.    ED Course  Procedures (including critical care time)  Labs Reviewed  CBC WITH DIFFERENTIAL - Abnormal; Notable for the following:    RBC 3.77 (*)     Hemoglobin 9.3 (*)     HCT 29.4 (*)     MCH 24.7 (*)     RDW 21.2 (*)     All other components within normal limits  COMPREHENSIVE METABOLIC PANEL - Abnormal; Notable for the following:     Albumin 3.3 (*)     All other components within normal limits   Dg Hip Complete Right  11/23/2011  *RADIOLOGY REPORT*  Clinical Data: Right anterior hip pain.  RIGHT HIP - COMPLETE 2+ VIEW  Comparison: None.  Findings: Bilateral hip arthroplasties.  No periprostatic fracture. Brachytherapy seeds are seen in the prostate.  IMPRESSION: Bilateral hip arthroplasties without complicating feature.   Original Report Authenticated By: Leanna Battles, M.D.      1. Decubitus ulcer       MDM  71 year old male with right hip decubitus ulcer, appears to be stage II.   5:12 PM Patient was seen by and evaluated by Dr. Ignacia Palma.  I am going to culture the wound, apply duoderm, and make a referral to the wound care center. I am going to give Keflex.  Patient is stable and ready for discharge, and understands the plan is agreeable with this.     Roxy Horseman, PA-C 11/23/11 1730

## 2011-11-23 NOTE — Progress Notes (Signed)
5:11 PM Pt has a 2 cm diameter cutaneous ulcer overlying the right greater trochanter, that goes into the subcutaneous fat and has seropurulent drainage.  He is not paralyzed, but has dementia and has had prior total hip replacement.  His ulcer is at the superior end of his old surgical incision.  Recommend C&S of wound, Rx Keflex 500 mg bid x 10 days, Duoderm dressing, and followup at the Wound Care Center.

## 2011-11-23 NOTE — ED Notes (Signed)
Dressing applied to R hip.  Wound is approx 3cm x 2 cm.  Yellow drainage noted.

## 2011-11-23 NOTE — ED Notes (Signed)
Had rt hip replacement a while backand now has open wound on rt hip area  unkn how long it has been that way size of ~3 inches across oozing serous drainge

## 2011-11-25 NOTE — ED Provider Notes (Signed)
Medical screening examination/treatment/procedure(s) were conducted as a shared visit with non-physician practitioner(s) and myself.  I personally evaluated the patient during the encounter Pt has a 2 cm diameter cutaneous ulcer overlying the right greater trochanter, that goes into the subcutaneous fat and has seropurulent drainage. He is not paralyzed, but has dementia and has had prior total hip replacement. His ulcer is at the superior end of his old surgical incision. Recommend C&S of wound, Rx Keflex 500 mg bid x 10 days, Duoderm dressing, and followup at the Wound Care Center.   Carleene Cooper III, MD 11/25/11 [phone]

## 2011-11-26 LAB — WOUND CULTURE

## 2011-11-27 NOTE — ED Notes (Signed)
+   Urine Patient treated with Kelfex-sensitive to same-chart appended per protocol MD. 

## 2011-12-08 ENCOUNTER — Other Ambulatory Visit (HOSPITAL_BASED_OUTPATIENT_CLINIC_OR_DEPARTMENT_OTHER): Payer: Self-pay | Admitting: General Surgery

## 2011-12-08 ENCOUNTER — Ambulatory Visit (HOSPITAL_COMMUNITY)
Admission: RE | Admit: 2011-12-08 | Discharge: 2011-12-08 | Disposition: A | Discharge status: Home / Self Care | Payer: Medicare Other | Source: Ambulatory Visit | Attending: General Surgery | Admitting: General Surgery

## 2011-12-08 ENCOUNTER — Encounter (HOSPITAL_BASED_OUTPATIENT_CLINIC_OR_DEPARTMENT_OTHER): Payer: Medicare Other | Attending: General Surgery

## 2011-12-08 DIAGNOSIS — L89309 Pressure ulcer of unspecified buttock, unspecified stage: Secondary | ICD-10-CM | POA: Insufficient documentation

## 2011-12-08 DIAGNOSIS — M869 Osteomyelitis, unspecified: Secondary | ICD-10-CM

## 2011-12-08 DIAGNOSIS — D509 Iron deficiency anemia, unspecified: Secondary | ICD-10-CM | POA: Insufficient documentation

## 2011-12-08 DIAGNOSIS — Z7901 Long term (current) use of anticoagulants: Secondary | ICD-10-CM | POA: Insufficient documentation

## 2011-12-08 DIAGNOSIS — L8993 Pressure ulcer of unspecified site, stage 3: Secondary | ICD-10-CM | POA: Insufficient documentation

## 2011-12-08 DIAGNOSIS — Z8546 Personal history of malignant neoplasm of prostate: Secondary | ICD-10-CM | POA: Insufficient documentation

## 2011-12-09 NOTE — Progress Notes (Cosign Needed)
[facility]  NAME:  Chris [name], Chris [name]               ACCOUNT NO.:  [id number]  MEDICAL RECORD NO.:  [id number]      DATE OF BIRTH:  [birth year]  PHYSICIAN:  Ardath Sax, M.D.      VISIT DATE:  12/08/2011                                  OFFICE VISIT   A 71 year old African-American male, somewhat demented who comes here because of a stage III pressure sore over his right ischium.  He also has a history of iron-deficiency anemia and many heart problems including atrial flutter.  He is on Coumadin and amiodarone.  He has also got tobacco abuse and resulting chronic obstructive pulmonary disease.  He has a history of prostate cancer and has been treated with radioactive seeds implanted.  He has chronic iron-deficiency anemia. His blood pressure is 130/70, his pulse is 100, his temperature 98, respirations 18.  On examination has a pressure ulcer on his right ischial area that is about 1.5 cm in diameter, but the important thing is that it is 2 cm deep and I can go right down and touch the bone. According to the sister, he has had both hips replaced several years ago.  Hopefully, this ulcer does not go down to the hardware.  We are going to get an x-ray of this to check on his prosthesis and to see if there is any bony involvement of this infection.  We are going to treat it with Silvercel dressings every day.  We got a culture today and I am going to start him on doxycycline as there is some foul material in this sinus tract that I debrided out today.  Other than that, he is going to have his sister help with these dressing changes, and we will see him back here in a week.     Ardath Sax, M.D.     PP/MEDQ  D:  12/08/2011  T:  12/09/2011  Job:  [id number]

## 2011-12-21 ENCOUNTER — Encounter (HOSPITAL_COMMUNITY): Payer: Self-pay | Admitting: Pharmacy Technician

## 2011-12-21 ENCOUNTER — Ambulatory Visit
Admission: RE | Admit: 2011-12-21 | Discharge: 2011-12-21 | Disposition: A | Discharge status: Home / Self Care | Payer: Medicare Other | Source: Ambulatory Visit | Attending: Orthopedic Surgery | Admitting: Orthopedic Surgery

## 2011-12-21 ENCOUNTER — Other Ambulatory Visit: Payer: Self-pay | Admitting: Orthopedic Surgery

## 2011-12-21 DIAGNOSIS — R52 Pain, unspecified: Secondary | ICD-10-CM

## 2011-12-21 DIAGNOSIS — T148XXA Other injury of unspecified body region, initial encounter: Secondary | ICD-10-CM

## 2011-12-22 ENCOUNTER — Other Ambulatory Visit (HOSPITAL_COMMUNITY): Payer: Medicare Other

## 2011-12-22 ENCOUNTER — Encounter (HOSPITAL_COMMUNITY): Payer: Self-pay

## 2011-12-22 ENCOUNTER — Encounter (HOSPITAL_COMMUNITY)
Admission: RE | Admit: 2011-12-22 | Discharge: 2011-12-22 | Disposition: A | Discharge status: Home / Self Care | Payer: Medicare Other | Source: Ambulatory Visit | Attending: Orthopedic Surgery | Admitting: Orthopedic Surgery

## 2011-12-22 LAB — CBC
Hemoglobin: 9.8 g/dL — ABNORMAL LOW (ref 13.0–17.0)
MCH: 24.6 pg — ABNORMAL LOW (ref 26.0–34.0)
MCHC: 31.5 g/dL (ref 30.0–36.0)
Platelets: 379 10*3/uL (ref 150–400)
RBC: 3.99 MIL/uL — ABNORMAL LOW (ref 4.22–5.81)

## 2011-12-22 LAB — BASIC METABOLIC PANEL
CO2: 22 mEq/L (ref 19–32)
Calcium: 10 mg/dL (ref 8.4–10.5)
GFR calc non Af Amer: >90 mL/min (ref >90)
Potassium: 4.8 mEq/L (ref 3.5–5.1)
Sodium: 138 mEq/L (ref 135–145)

## 2011-12-22 LAB — SURGICAL PCR SCREEN
MRSA, PCR: NEGATIVE
Staphylococcus aureus: POSITIVE — AB

## 2011-12-22 NOTE — Pre-Procedure Instructions (Signed)
20 Chris [name] Riding  12/22/2011   Your procedure is scheduled on: Thursday  12/24/11   Report to [facility] at 530 AM.  Call this number if you have problems the morning of surgery: [phone]   Remember:   Do not eat food OR DRINK :After Midnight.   Take these medicines the morning of surgery with A SIP OF WATER: VICODIN IF NEEDED   Do not wear jewelry, make-up or nail polish.  Do not wear lotions, powders, or perfumes. You may wear deodorant.  Do not shave 48 hours prior to surgery. Men may shave face and neck.  Do not bring valuables to the hospital.  Contacts, dentures or bridgework may not be worn into surgery.  Leave suitcase in the car. After surgery it may be brought to your room.  For patients admitted to the hospital, checkout time is 11:00 AM the day of discharge.   Patients discharged the day of surgery will not be allowed to drive home.  Name and phone number of your driver:   Special Instructions: Shower using CHG 2 nights before surgery and the night before surgery.  If you shower the day of surgery use CHG.  Use special wash - you have one bottle of CHG for all showers.  You should use approximately 1/3 of the bottle for each shower.   Please read over the following fact sheets that you were given: Pain Booklet, Coughing and Deep Breathing, MRSA Information and Surgical Site Infection Prevention

## 2011-12-23 ENCOUNTER — Encounter (HOSPITAL_COMMUNITY): Payer: Self-pay | Admitting: Vascular Surgery

## 2011-12-23 HISTORY — PX: IRRIGATION AND DEBRIDEMENT KNEE: SHX5185

## 2011-12-23 NOTE — Consult Note (Signed)
Anesthesia chart review: Patient is a 71 year old male scheduled for I&D of right hip wound (orders pending) on 12/24/11 by Dr. Montez Morita.  History includes smoking, poor dentition, COPD, history of atrial flutter status post cardioversion '10, CHF with normal EF and moderate MR/AR by TEE '10, prostate cancer s/p radioactive seed implantation 2011, bilateral hip replacements, iron deficiency anemia with hospitalization in August 2013 for profound anemia with a hemoglobin of 4.5.  PCP is Dr. [name], established in August 2013.  He was evaluated by Cardiologist Dr. Peter Swaziland in November 2010 for aflutter and cardioversion, but no additional recent records.  There is an office note from Dr. Swaziland from 12/25/08 (found in [facility]) that indicates there was no need for valve replacement at that time.  His Coumadin was also stopped at that time.  EKG on 08/19/11 showed NSR.  TEE on 12/03/08 showed: 1. Left ventricle: The cavity size was normal. There was mild concentric hypertrophy. Systolic function was normal. The estimated ejection fraction was in the range of 60% to 65%. 2. Aortic valve: Moderate regurgitation directed towards the mitral anterior leaflet. 3. Mitral valve: Moderately thickened, mildly calcified leaflets anterior. Moderate regurgitation directed centrally. 4. Left atrium: The atrium was mildly dilated. No evidence of thrombus in the atrial cavity or appendage. 5. Right atrium: No evidence of thrombus in the atrial cavity or appendage. 6. Atrial septum: No defect or patent foramen ovale was identified. 7. Pulmonic valve: No evidence of vegetation. 8. Tricuspid valve: Mild regurgitation. 9. Pericardium, extracardiac: A trivial pericardial effusion was identified.  2D echo on 11/30/08 showed: 1. Left ventricle: The cavity size was normal. Wall thickness was normal. Systolic function was normal. The estimated ejection fraction was in the range of 60% to 65%. Wall motion was  normal; there were no regional wall motion abnormalities. 2. Aortic valve: Mild regurgitation. 3. Mitral valve: There is moderate thickening and calcification of the anterior valve leaflet with malcoaptation. Severe regurgitation. 4. Left atrium: The atrium was moderately dilated. 5. Right ventricle: The cavity size was mildly dilated. 6. Right atrium: The atrium was mildly dilated. 7. Tricuspid valve: Moderate regurgitation. 8. Pulmonary arteries: PA peak pressure: 53mm Hg (S). 9. Pericardium, extracardiac: A small pericardial effusion was identified anterior to the heart. There was a left pleural effusion. Impressions:The right ventricular systolic pressure was increased consistent with moderate pulmonary hypertension.  Chest x-ray on 08/11/2011 showed no acute disease.  Preoperative CBC and BMET noted.  H/H stable @ 9.8/31.1.  He remained in NSR as of August 2013.  He did have at least moderate MR/AR by echo in August 2011.  I was not asked to see Mr. Myler at his PAT visit yesterday.  Clinical correlation on the day of surgery.  If he is maintaining NSR and has no new CV/CHF symptoms then would anticipate he can proceed with I&D of his hip wound.  I reviewed above with Anesthesiologist Dr. Noreene Larsson.  Shonna Chock, PA-C 12/23/11 1600

## 2011-12-24 ENCOUNTER — Other Ambulatory Visit: Payer: Self-pay | Admitting: Orthopedic Surgery

## 2011-12-24 ENCOUNTER — Encounter (HOSPITAL_COMMUNITY): Payer: Self-pay | Admitting: Vascular Surgery

## 2011-12-24 ENCOUNTER — Encounter (HOSPITAL_COMMUNITY)
Admission: RE | Disposition: A | Discharge status: Home Health | Payer: Self-pay | Source: Ambulatory Visit | Attending: Orthopedic Surgery

## 2011-12-24 ENCOUNTER — Encounter (HOSPITAL_COMMUNITY): Payer: Self-pay | Admitting: *Deleted

## 2011-12-24 ENCOUNTER — Inpatient Hospital Stay (HOSPITAL_COMMUNITY)
Admission: RE | Admit: 2011-12-24 | Discharge: 2011-12-28 | DRG: 858 | Disposition: A | Discharge status: Home Health | Payer: Medicare Other | Source: Ambulatory Visit | Attending: Orthopedic Surgery | Admitting: Orthopedic Surgery

## 2011-12-24 ENCOUNTER — Encounter (HOSPITAL_COMMUNITY): Payer: Self-pay | Admitting: General Practice

## 2011-12-24 ENCOUNTER — Inpatient Hospital Stay (HOSPITAL_COMMUNITY): Payer: Medicare Other | Admitting: Vascular Surgery

## 2011-12-24 DIAGNOSIS — J449 Chronic obstructive pulmonary disease, unspecified: Secondary | ICD-10-CM | POA: Diagnosis present

## 2011-12-24 DIAGNOSIS — J4489 Other specified chronic obstructive pulmonary disease: Secondary | ICD-10-CM | POA: Diagnosis present

## 2011-12-24 DIAGNOSIS — C61 Malignant neoplasm of prostate: Secondary | ICD-10-CM | POA: Diagnosis present

## 2011-12-24 DIAGNOSIS — Z96649 Presence of unspecified artificial hip joint: Secondary | ICD-10-CM

## 2011-12-24 DIAGNOSIS — F0390 Unspecified dementia without behavioral disturbance: Secondary | ICD-10-CM | POA: Diagnosis present

## 2011-12-24 DIAGNOSIS — T8140XA Infection following a procedure, unspecified, initial encounter: Principal | ICD-10-CM | POA: Diagnosis present

## 2011-12-24 DIAGNOSIS — F17200 Nicotine dependence, unspecified, uncomplicated: Secondary | ICD-10-CM | POA: Diagnosis present

## 2011-12-24 DIAGNOSIS — I080 Rheumatic disorders of both mitral and aortic valves: Secondary | ICD-10-CM | POA: Diagnosis present

## 2011-12-24 DIAGNOSIS — I509 Heart failure, unspecified: Secondary | ICD-10-CM | POA: Diagnosis present

## 2011-12-24 DIAGNOSIS — G8918 Other acute postprocedural pain: Secondary | ICD-10-CM

## 2011-12-24 DIAGNOSIS — Y831 Surgical operation with implant of artificial internal device as the cause of abnormal reaction of the patient, or of later complication, without mention of misadventure at the time of the procedure: Secondary | ICD-10-CM | POA: Diagnosis present

## 2011-12-24 DIAGNOSIS — B957 Other staphylococcus as the cause of diseases classified elsewhere: Secondary | ICD-10-CM | POA: Diagnosis present

## 2011-12-24 HISTORY — PX: I & D EXTREMITY: SHX5045

## 2011-12-24 SURGERY — IRRIGATION AND DEBRIDEMENT EXTREMITY
Anesthesia: General | Site: Hip | Laterality: Right | Wound class: Dirty or Infected

## 2011-12-24 MED ORDER — OXYCODONE HCL 5 MG/5ML PO SOLN: 5.0000 mg | Freq: Once | ORAL | Status: DC | PRN

## 2011-12-24 MED ORDER — SUCCINYLCHOLINE CHLORIDE 20 MG/ML IJ SOLN
INTRAMUSCULAR | Status: DC | PRN
  Administered 2011-12-24: 110 mg via INTRAVENOUS

## 2011-12-24 MED ORDER — HYDROMORPHONE HCL PF 1 MG/ML IJ SOLN
INTRAMUSCULAR | Status: AC
  Filled 2011-12-24: qty 1

## 2011-12-24 MED ORDER — LIDOCAINE HCL (CARDIAC) 20 MG/ML IV SOLN
INTRAVENOUS | Status: DC | PRN
  Administered 2011-12-24: 100 mg via INTRAVENOUS

## 2011-12-24 MED ORDER — PHENYLEPHRINE HCL 10 MG/ML IJ SOLN
INTRAMUSCULAR | Status: DC | PRN
  Administered 2011-12-24 (×4): 80 ug via INTRAVENOUS
  Administered 2011-12-24: 40 ug via INTRAVENOUS

## 2011-12-24 MED ORDER — PROPOFOL 10 MG/ML IV BOLUS
INTRAVENOUS | Status: DC | PRN
  Administered 2011-12-24: 100 mg via INTRAVENOUS
  Administered 2011-12-24: 50 mg via INTRAVENOUS

## 2011-12-24 MED ORDER — MENTHOL 3 MG MT LOZG: 1.0000 | LOZENGE | OROMUCOSAL | Status: DC | PRN

## 2011-12-24 MED ORDER — FERROUS FUM-IRON POLYSACCH-FA 162-115.2-1 MG PO CAPS: 1.0000 | ORAL_CAPSULE | Freq: Every day | ORAL | Status: DC

## 2011-12-24 MED ORDER — OXYCODONE HCL ER 10 MG PO T12A
10.0000 mg | EXTENDED_RELEASE_TABLET | Freq: Two times a day (BID) | ORAL | Status: DC
  Administered 2011-12-24 – 2011-12-28 (×9): 10 mg via ORAL
  Filled 2011-12-24 (×9): qty 1

## 2011-12-24 MED ORDER — ONDANSETRON HCL 4 MG PO TABS: 4.0000 mg | ORAL_TABLET | Freq: Four times a day (QID) | ORAL | Status: DC | PRN

## 2011-12-24 MED ORDER — ONDANSETRON HCL 4 MG/2ML IJ SOLN: 4.0000 mg | Freq: Four times a day (QID) | INTRAMUSCULAR | Status: DC | PRN

## 2011-12-24 MED ORDER — INFLUENZA VIRUS VACC SPLIT PF IM SUSP
0.5000 mL | INTRAMUSCULAR | Status: AC
  Filled 2011-12-24: qty 0.5

## 2011-12-24 MED ORDER — PROMETHAZINE HCL 25 MG/ML IJ SOLN
6.2500 mg | INTRAMUSCULAR | Status: DC | PRN
Start: 2011-12-24 — End: 2011-12-24

## 2011-12-24 MED ORDER — ACETAMINOPHEN 650 MG RE SUPP: 650.0000 mg | Freq: Four times a day (QID) | RECTAL | Status: DC | PRN

## 2011-12-24 MED ORDER — ALUM & MAG HYDROXIDE-SIMETH 200-200-20 MG/5ML PO SUSP: 30.0000 mL | ORAL | Status: DC | PRN

## 2011-12-24 MED ORDER — METOCLOPRAMIDE HCL 10 MG PO TABS: 5.0000 mg | ORAL_TABLET | Freq: Three times a day (TID) | ORAL | Status: DC | PRN

## 2011-12-24 MED ORDER — ASPIRIN EC 325 MG PO TBEC
325.0000 mg | DELAYED_RELEASE_TABLET | Freq: Every day | ORAL | Status: DC
  Administered 2011-12-25 – 2011-12-27 (×3): 325 mg via ORAL
  Filled 2011-12-24 (×5): qty 1

## 2011-12-24 MED ORDER — VANCOMYCIN HCL IN DEXTROSE 1-5 GM/200ML-% IV SOLN
1000.0000 mg | Freq: Two times a day (BID) | INTRAVENOUS | Status: AC
  Administered 2011-12-24: 1000 mg via INTRAVENOUS
  Filled 2011-12-24: qty 200

## 2011-12-24 MED ORDER — ACETAMINOPHEN 10 MG/ML IV SOLN
1000.0000 mg | Freq: Four times a day (QID) | INTRAVENOUS | Status: AC
  Administered 2011-12-24 – 2011-12-25 (×3): 1000 mg via INTRAVENOUS
  Filled 2011-12-24 (×4): qty 100

## 2011-12-24 MED ORDER — OXYCODONE HCL 5 MG PO TABS: 5.0000 mg | ORAL_TABLET | Freq: Once | ORAL | Status: DC | PRN

## 2011-12-24 MED ORDER — SODIUM CHLORIDE 0.9 % IV SOLN
1500.0000 mg | Freq: Once | INTRAVENOUS | Status: DC
  Filled 2011-12-24: qty 1500

## 2011-12-24 MED ORDER — METOCLOPRAMIDE HCL 5 MG/ML IJ SOLN: 5.0000 mg | Freq: Three times a day (TID) | INTRAMUSCULAR | Status: DC | PRN

## 2011-12-24 MED ORDER — SODIUM CHLORIDE 0.9 % IR SOLN
Status: DC | PRN
  Administered 2011-12-24: 3000 mL

## 2011-12-24 MED ORDER — ESMOLOL HCL 10 MG/ML IV SOLN
INTRAVENOUS | Status: DC | PRN
  Administered 2011-12-24: 30 mg via INTRAVENOUS

## 2011-12-24 MED ORDER — HYDROMORPHONE HCL PF 1 MG/ML IJ SOLN
0.2500 mg | INTRAMUSCULAR | Status: DC | PRN
  Administered 2011-12-24 (×2): 0.5 mg via INTRAVENOUS

## 2011-12-24 MED ORDER — LACTATED RINGERS IV SOLN
INTRAVENOUS | Status: DC | PRN
  Administered 2011-12-24: 07:00:00 via INTRAVENOUS

## 2011-12-24 MED ORDER — ARTIFICIAL TEARS OP OINT
TOPICAL_OINTMENT | OPHTHALMIC | Status: DC | PRN
  Administered 2011-12-24: 1 via OPHTHALMIC

## 2011-12-24 MED ORDER — DOCUSATE SODIUM 100 MG PO CAPS
100.0000 mg | ORAL_CAPSULE | Freq: Two times a day (BID) | ORAL | Status: DC
  Administered 2011-12-24 – 2011-12-28 (×8): 100 mg via ORAL
  Filled 2011-12-24 (×10): qty 1

## 2011-12-24 MED ORDER — ACETAMINOPHEN 325 MG PO TABS
650.0000 mg | ORAL_TABLET | Freq: Four times a day (QID) | ORAL | Status: DC | PRN
  Administered 2011-12-28: 650 mg via ORAL
  Filled 2011-12-24 (×2): qty 2

## 2011-12-24 MED ORDER — VANCOMYCIN HCL 1000 MG IV SOLR
1000.0000 mg | INTRAVENOUS | Status: DC | PRN
  Administered 2011-12-24: 1500 mg via INTRAVENOUS

## 2011-12-24 MED ORDER — PHENOL 1.4 % MT LIQD: 1.0000 | OROMUCOSAL | Status: DC | PRN

## 2011-12-24 MED ORDER — FENTANYL CITRATE 0.05 MG/ML IJ SOLN
INTRAMUSCULAR | Status: DC | PRN
  Administered 2011-12-24 (×2): 25 ug via INTRAVENOUS
  Administered 2011-12-24: 100 ug via INTRAVENOUS

## 2011-12-24 MED ORDER — HYDROMORPHONE HCL PF 1 MG/ML IJ SOLN
0.5000 mg | INTRAMUSCULAR | Status: DC | PRN
  Administered 2011-12-25: 0.5 mg via INTRAVENOUS
  Filled 2011-12-24 (×3): qty 1

## 2011-12-24 MED ORDER — ONDANSETRON HCL 4 MG/2ML IJ SOLN
INTRAMUSCULAR | Status: DC | PRN
  Administered 2011-12-24: 4 mg via INTRAVENOUS

## 2011-12-24 MED ORDER — FUROSEMIDE 40 MG PO TABS
40.0000 mg | ORAL_TABLET | Freq: Every day | ORAL | Status: DC
  Administered 2011-12-25 – 2011-12-28 (×4): 40 mg via ORAL
  Filled 2011-12-24 (×5): qty 1

## 2011-12-24 MED ORDER — DEXTROSE-NACL 5-0.45 % IV SOLN
INTRAVENOUS | Status: DC
  Administered 2011-12-24 – 2011-12-25 (×2): via INTRAVENOUS

## 2011-12-24 MED ORDER — FE FUMARATE-B12-VIT C-FA-IFC PO CAPS
1.0000 | ORAL_CAPSULE | Freq: Every day | ORAL | Status: DC
  Administered 2011-12-25 – 2011-12-28 (×4): 1 via ORAL
  Filled 2011-12-24 (×5): qty 1

## 2011-12-24 SURGICAL SUPPLY — 38 items
BANDAGE ELASTIC 3 VELCRO ST LF (GAUZE/BANDAGES/DRESSINGS) ×2 IMPLANT
BANDAGE ELASTIC 4 VELCRO ST LF (GAUZE/BANDAGES/DRESSINGS) ×2 IMPLANT
BANDAGE GAUZE ELAST BULKY 4 IN (GAUZE/BANDAGES/DRESSINGS) ×2 IMPLANT
BLADE SURG 10 STRL SS (BLADE) ×4 IMPLANT
BNDG COHESIVE 4X5 TAN STRL (GAUZE/BANDAGES/DRESSINGS) ×2 IMPLANT
CLOTH BEACON ORANGE TIMEOUT ST (SAFETY) ×2 IMPLANT
CUFF TOURNIQUET SINGLE 18IN (TOURNIQUET CUFF) ×1 IMPLANT
CUFF TOURNIQUET SINGLE 24IN (TOURNIQUET CUFF) IMPLANT
DRAPE U-SHAPE 47X51 STRL (DRAPES) ×2 IMPLANT
DRSG ADAPTIC 3X8 NADH LF (GAUZE/BANDAGES/DRESSINGS) ×2 IMPLANT
DRSG EMULSION OIL 3X3 NADH (GAUZE/BANDAGES/DRESSINGS) ×2 IMPLANT
DRSG PAD ABDOMINAL 8X10 ST (GAUZE/BANDAGES/DRESSINGS) ×2 IMPLANT
DURAPREP 26ML APPLICATOR (WOUND CARE) ×2 IMPLANT
ELECT REM PT RETURN 9FT ADLT (ELECTROSURGICAL) ×2
ELECTRODE REM PT RTRN 9FT ADLT (ELECTROSURGICAL) IMPLANT
GLOVE SS PI 9.0 STRL (GLOVE) ×2 IMPLANT
GOWN PREVENTION PLUS XLARGE (GOWN DISPOSABLE) ×2 IMPLANT
GOWN STRL NON-REIN LRG LVL3 (GOWN DISPOSABLE) ×4 IMPLANT
HANDPIECE INTERPULSE COAX TIP (DISPOSABLE) ×2
KIT BASIN OR (CUSTOM PROCEDURE TRAY) ×2 IMPLANT
KIT ROOM TURNOVER OR (KITS) ×2 IMPLANT
MANIFOLD NEPTUNE II (INSTRUMENTS) ×2 IMPLANT
NS IRRIG 1000ML POUR BTL (IV SOLUTION) ×2 IMPLANT
PACK ORTHO EXTREMITY (CUSTOM PROCEDURE TRAY) ×2 IMPLANT
PACK TOTAL JOINT (CUSTOM PROCEDURE TRAY) ×1 IMPLANT
PAD ARMBOARD 7.5X6 YLW CONV (MISCELLANEOUS) ×4 IMPLANT
SET HNDPC FAN SPRY TIP SCT (DISPOSABLE) IMPLANT
SPONGE GAUZE 4X4 12PLY (GAUZE/BANDAGES/DRESSINGS) ×2 IMPLANT
SPONGE LAP 18X18 X RAY DECT (DISPOSABLE) ×3 IMPLANT
SPONGE LAP 4X18 X RAY DECT (DISPOSABLE) ×2 IMPLANT
STOCKINETTE IMPERVIOUS 9X36 MD (GAUZE/BANDAGES/DRESSINGS) ×2 IMPLANT
TOWEL OR 17X24 6PK STRL BLUE (TOWEL DISPOSABLE) ×2 IMPLANT
TOWEL OR 17X26 10 PK STRL BLUE (TOWEL DISPOSABLE) ×2 IMPLANT
TUBE ANAEROBIC SPECIMEN COL (MISCELLANEOUS) IMPLANT
TUBE CONNECTING 12X1/4 (SUCTIONS) ×2 IMPLANT
UNDERPAD 30X30 INCONTINENT (UNDERPADS AND DIAPERS) ×2 IMPLANT
WATER STERILE IRR 1000ML POUR (IV SOLUTION) ×2 IMPLANT
YANKAUER SUCT BULB TIP NO VENT (SUCTIONS) ×2 IMPLANT

## 2011-12-24 NOTE — Op Note (Signed)
Chris [name], BENHAM NO.:  [id number]  MEDICAL RECORD NO.:  [id number]  LOCATION:  5N05C                        FACILITY:  [facility]  PHYSICIAN:  Myrtie Neither, MD      DATE OF BIRTH:  October 09, 1940  DATE OF PROCEDURE:  12/24/2011 DATE OF DISCHARGE:                              OPERATIVE REPORT   PREOPERATIVE DIAGNOSES:  Open wound, right hip; infection, right hip.  POSTOPERATIVE DIAGNOSES:  Open wound, right hip; trochanteric infection, right hip.  ANESTHESIA:  General.  PROCEDURE:  Incision and debridement and resection of trochanteric bursa.  CULTURES:  Aerobic and anaerobic and Gram stain.  WOUND PACKED:  Betadine packing.  DRAIN APPLIED:  Hemovac, right hip.  DESCRIPTION OF PROCEDURE:  The patient was taken to operating room. After given adequate preop medications, given general anesthesia and intubated.  The patient was placed in right lateral position.  The wound was 2 inches in diameter with serous drainage from the area.  Right hip was scrubbed with Betadine paint and draped in sterile manner.  The wound was extended both proximally and distally.  Cultures aerobic and anaerobic were done prior to IV antibiotics as well as Gram stain. After cultures obtained, abscess was identified extending down to the trochanteric bursa.  Complete resection of the abscess was done in addition to the bursa.  Simpulse irrigation with saline as well as antibiotic solution was done.  After adequate debridement and excision of infected material, Hemovac drain was placed into the wound and Betadine packing.  Compressive dressing was applied.  The patient tolerated the procedure quite well and went to recovery room in stable and satisfactory condition.  The wound was left open.  No closure done. The patient went to recovery room in stable and satisfactory condition. The patient is being admitted to the hospital.     Myrtie Neither, MD     AC/MEDQ  D:  12/24/2011   T:  12/24/2011  Job:  [id number]

## 2011-12-24 NOTE — Anesthesia Postprocedure Evaluation (Signed)
Anesthesia Post Note  Patient: Chris [name]  Procedure(s) Performed: Procedure(s) (LRB): IRRIGATION AND DEBRIDEMENT EXTREMITY (Right)  Anesthesia type: general  Patient location: PACU  Post pain: Pain level controlled  Post assessment: Patient's Cardiovascular Status Stable  Last Vitals:  Filed Vitals:   12/24/11 0945  BP: 139/66  Pulse: 92  Temp:   Resp: 16    Post vital signs: Reviewed and stable  Level of consciousness: sedated  Complications: No apparent anesthesia complications

## 2011-12-24 NOTE — Progress Notes (Signed)
Spoke with Dr. Montez Morita asking for orders.

## 2011-12-24 NOTE — Preoperative (Signed)
Beta Blockers   Reason not to administer Beta Blockers: not prescribed 

## 2011-12-24 NOTE — H&P (Signed)
Chris [name], Chris [name] NO.:  [id number]  MEDICAL RECORD NO.:  [id number]  LOCATION:  5N05C                        FACILITY:  [facility]  PHYSICIAN:  Myrtie Neither, MD      DATE OF BIRTH:  1940-03-13  DATE OF ADMISSION:  12/24/2011 DATE OF DISCHARGE:                             HISTORY & PHYSICAL   CHIEF COMPLAINT:  Painful draining open wound, right hip.  HISTORY OF PRESENT ILLNESS:  This is a 71 year old male, who presented to the office less than 2 weeks ago with complaint of open wound draining over his right hip over the past 3 months.  The patient has been treated with his primary care with antibiotics and wound care treatment by home health nurse to the right hip with no improvement and continued drainage.  The patient denies any fever, chills, and night sweats.  The patient does have history of right total hip arthroplasty and history of prostate cancer.  PAST MEDICAL HISTORY:  Dementia, atrial fibrillation, previously treated with Coumadin and amiodarone in 2010, chronic iron deficiency anemia, history of congestive heart failure, COPD, prostate cancer, mitral valve regurgitation.  SURGICAL HISTORY:  Bilateral hip arthroplasty right hip in 2002, left hip in year 2000.  The patient has also had history of colonoscopy and esophagogastroduodenoscopy.  ALLERGIES:  PENICILLIN.  FAMILY HISTORY:  Negative for high blood pressure, diabetes, or heart disease.  SOCIAL HISTORY:  The patient denies use of tobacco.  Had history of use of alcohol.  Denies history of any illegal drugs.  REVIEW OF SYSTEMS:  Some shortness of breath due to COPD.  Some urinary hesitancy and incontinence with prostate cancer.  No chest pains.  No history of fever, chills, or night sweats.  LABORATORY DATA:  The patient had x-rays of both hips, which revealed total hip implants were intact.  No sign of osteomyelitis or any loosening.  The patient also had CT scan of the right hip,  which demonstrated the open wound over the right hip with no evidence of any loosening or osteomyelitis.  The patient had C reactive protein, which was elevated.  Sed rate was also elevated.  CBC revealed chronic anemia of 9.8, hemoglobin and hematocrit of 31.1.  PHYSICAL EXAMINATION:  VITAL SIGNS:  Temperature 97.6, pulse 96, blood pressure 117/66, O2 saturation 100%, height 5 feet 10 inches, weight 69.491 kg. HEAD:  Normocephalic. EYES:  Conjunctivae and sclerae clear. NECK:  Rigid.  Limited range of motion due to degenerative joint disease. CHEST:  Clear. CARDIAC:  S1, S2.  Regular. EXTREMITIES:  Right hip with 2 inch diameter draining serous fluid over the trochanter of the right hip.  Right hip is nontender other than palpation directly over the ulceration.  IMPRESSION:  Open wound, right hip; infection, right hip.  PLAN:  Incision and debridement, right hip.  The patient was instructed not to take any antibiotics until he is seen at surgery to get a better idea as to what bacterium has caused this infection.     Myrtie Neither, MD     AC/MEDQ  D:  12/24/2011  T:  12/24/2011  Job:  [id number]

## 2011-12-24 NOTE — Anesthesia Procedure Notes (Signed)
Procedure Name: Intubation Date/Time: 12/24/2011 8:03 AM Performed by: Gayla Medicus Pre-anesthesia Checklist: Patient identified, Timeout performed, Emergency Drugs available, Suction available and Patient being monitored Patient Re-evaluated:Patient Re-evaluated prior to inductionOxygen Delivery Method: Circle system utilized Preoxygenation: Pre-oxygenation with 100% oxygen Intubation Type: IV induction Laryngoscope Size: Mac and 3 Grade View: Grade I Tube type: Oral Tube size: 7.5 mm Number of attempts: 1 Airway Equipment and Method: Stylet Placement Confirmation: ETT inserted through vocal cords under direct vision,  positive ETCO2 and breath sounds checked- equal and bilateral Secured at: 23 cm Tube secured with: Tape Dental Injury: Teeth and Oropharynx as per pre-operative assessment

## 2011-12-24 NOTE — H&P (Signed)
Chris [name] is an 71 y.o. male.   Chief Complaint: OPEN WOUND RIGHT HIP FOR 3 M0NTHS HPI: THIS IS A 71 Y/O MALE SEEN IN THE OFFICE 2 WEEKS AGO FOR OPEN WOUND TO THE RIGHT HIP FOR 3 MONTHS TREATED WITH WOUND CARE NURSE TREATMENT AND ANTIBIOTICS WITH OUT IMPROVEMENT. PATIENT DOES HAVE A TOTAL HIP WHICH WAS FOUND NOT TO INFECTED BY WAY OF X-RAY, AND CT-SCAN.  Past Medical History  Diagnosis Date  . Poor dentition   . COPD (chronic obstructive pulmonary disease)   . CHF (congestive heart failure)   . History of blood transfusion 08/19/2011    "first time"  . Prostate cancer 2011    seed implant  . Atrial flutter     h/o; successful cardioversion,TEE guided  . Iron deficiency anemia     HX BLOOD TX  . Aortic valve regurgitation     moderate AR by echo 2010  . Mitral valve regurgitation     moderate to severe by 2D, mod by TEE '10    Past Surgical History  Procedure Date  . Total hip arthroplasty ,right in 2000 left in 2002    bilateral  . Esophagogastroduodenoscopy 08/21/2011    Procedure: ESOPHAGOGASTRODUODENOSCOPY (EGD);  Surgeon: Shirley Friar, MD;  Location: [facility] ENDOSCOPY;  Service: Endoscopy;  Laterality: N/A;  . Colonoscopy 08/21/2011    Procedure: COLONOSCOPY;  Surgeon: Shirley Friar, MD;  Location: [facility] ENDOSCOPY;  Service: Endoscopy;  Laterality: N/A;  . Joint replacement     History reviewed. No pertinent family history. Social History:  reports that he has been smoking Cigarettes.  He has a 57 pack-year smoking history. He has never used smokeless tobacco. He reports that he drinks about 12.6 ounces of alcohol per week. He reports that he does not use illicit drugs.  Allergies:  Allergies  Allergen Reactions  . Penicillins Nausea And Vomiting    Medications Prior to Admission  Medication Sig Dispense Refill  . Ferrous Fum-Iron Polysacch-FA (TANDEM F) 162-115.2-1 MG CAPS Take 1 capsule by mouth daily.  30 each  0  . furosemide (LASIX) 40 MG tablet Take 40  mg by mouth daily.      Marland Kitchen HYDROcodone-acetaminophen (VICODIN) 5-500 MG per tablet Take 1 tablet by mouth every 6 (six) hours as needed. For pain        Results for orders placed during the hospital encounter of 12/22/11 (from the past 48 hour(s))  SURGICAL PCR SCREEN     Status: Abnormal   Collection Time   12/22/11 12:44 PM      Component Value Range Comment   MRSA, PCR NEGATIVE  NEGATIVE    Staphylococcus aureus POSITIVE (*) NEGATIVE   CBC     Status: Abnormal   Collection Time   12/22/11 12:44 PM      Component Value Range Comment   WBC 5.6  4.0 - 10.5 K/uL    RBC 3.99 (*) 4.22 - 5.81 MIL/uL    Hemoglobin 9.8 (*) 13.0 - 17.0 g/dL    HCT 16.1 (*) 09.6 - 52.0 %    MCV 77.9 (*) 78.0 - 100.0 fL    MCH 24.6 (*) 26.0 - 34.0 pg    MCHC 31.5  30.0 - 36.0 g/dL    RDW 04.5 (*) 40.9 - 15.5 %    Platelets 379  150 - 400 K/uL   BASIC METABOLIC PANEL     Status: Normal   Collection Time   12/22/11 12:44 PM  Component Value Range Comment   Sodium 138  135 - 145 mEq/L    Potassium 4.8  3.5 - 5.1 mEq/L    Chloride 104  96 - 112 mEq/L    CO2 22  19 - 32 mEq/L    Glucose, Bld 86  70 - 99 mg/dL    BUN 11  6 - 23 mg/dL    Creatinine, Ser 9.14  0.50 - 1.35 mg/dL    Calcium 78.2  8.4 - 10.5 mg/dL    GFR calc non Af Amer >90  >90 mL/min    GFR calc Af Amer >90  >90 mL/min    No results found.  Review of Systems  Constitutional: Negative.   HENT: Negative.   Eyes: Negative.   Respiratory: Positive for shortness of breath.   Gastrointestinal: Negative.   Musculoskeletal: Positive for joint pain.  Skin: Negative.   Neurological: Negative.   Endo/Heme/Allergies: Negative.   Psychiatric/Behavioral: Positive for memory loss.    Blood pressure 117/66, pulse 96, temperature 97.5 F (36.4 C), temperature source Oral, resp. rate 16, SpO2 99.00%. Physical Exam RIGHT HIP WITH A 2 INCH DIAMETER DRAINING WOUND WITH SEROUS FLUID.   Assessment/Plan OPEN INFECTED WOUND RIGHT HIP/ INCISION  AND DEBRIDEMENT  AND POSSIBLE WOUND CLOSURE  RIGHT HIP.  Kennieth Rad 12/24/2011, 7:21 AM

## 2011-12-24 NOTE — Transfer of Care (Signed)
Immediate Anesthesia Transfer of Care Note  Patient: Chris [name]  Procedure(s) Performed: Procedure(s) (LRB) with comments: IRRIGATION AND DEBRIDEMENT EXTREMITY (Right) - POSSIBLE WOUND CLOSURE  Patient Location: PACU  Anesthesia Type:General  Level of Consciousness: awake, alert  and oriented  Airway & Oxygen Therapy: Patient Spontanous Breathing and Patient connected to face mask oxygen  Post-op Assessment: Report given to PACU RN and Post -op Vital signs reviewed and stable  Post vital signs: Reviewed and stable  Complications: No apparent anesthesia complications

## 2011-12-24 NOTE — Anesthesia Preprocedure Evaluation (Addendum)
Anesthesia Evaluation  Patient identified by MRN, date of birth, ID band Patient awake    Reviewed: Allergy & Precautions, H&P , NPO status , Patient's Chart, lab work & pertinent test results  History of Anesthesia Complications Negative for: history of anesthetic complications  Airway Mallampati: II TM Distance: >3 FB     Dental  (+) Poor Dentition   Pulmonary COPD   Pulmonary exam normal       Cardiovascular +CHF + dysrhythmias Atrial Fibrillation + Valvular Problems/Murmurs MR     Neuro/Psych PSYCHIATRIC DISORDERS dementia   GI/Hepatic negative GI ROS, Neg liver ROS,   Endo/Other  negative endocrine ROS  Renal/GU negative Renal ROS     Musculoskeletal   Abdominal   Peds  Hematology   Anesthesia Other Findings   Reproductive/Obstetrics                         Anesthesia Physical Anesthesia Plan  ASA: III  Anesthesia Plan: MAC   Post-op Pain Management:    Induction: Intravenous  Airway Management Planned: Oral ETT  Additional Equipment:   Intra-op Plan:   Post-operative Plan: Extubation in OR  Informed Consent: I have reviewed the patients History and Physical, chart, labs and discussed the procedure including the risks, benefits and alternatives for the proposed anesthesia with the patient or authorized representative who has indicated his/her understanding and acceptance.   Dental advisory given  Plan Discussed with: Surgeon, Anesthesiologist and CRNA  Anesthesia Plan Comments:       Anesthesia Quick Evaluation

## 2011-12-24 NOTE — Brief Op Note (Signed)
12/24/2011  8:53 AM  PATIENT:  Chris [name]  70 y.o. male  PRE-OPERATIVE DIAGNOSIS:  WOUND RIGHT HIP  POST-OPERATIVE DIAGNOSIS:  WOUND RIGHT HIP  PROCEDURE:  Procedure(s) (LRB) with comments: IRRIGATION AND DEBRIDEMENT EXTREMITY (Right) - POSSIBLE WOUND CLOSURE  SURGEON:  Surgeon(s) and Role:    * Kennieth Rad, MD - Primary  PHYSICIAN ASSISTANT:   ASSISTANTS: none   ANESTHESIA:   general  EBL:     BLOOD ADMINISTERED:none  DRAINS: (right hip) Hemovact drain(s) in the right hip with  Suction Open   LOCAL MEDICATIONS USED:  NONE  SPECIMEN:  No Specimen  DISPOSITION OF SPECIMEN:  N/A  COUNTS:  YES  TOURNIQUET:  * No tourniquets in log *  DICTATION: .Other Dictation: Dictation Number REPORT #[id number]  PLAN OF CARE: Admit to inpatient   PATIENT DISPOSITION:  PACU - hemodynamically stable.   Delay start of Pharmacological VTE agent (>24hrs) due to surgical blood loss or risk of bleeding: not applicable

## 2011-12-24 NOTE — Care Management Note (Signed)
  Page 1 of 1   12/24/2011     12:05:07 PM   CARE MANAGEMENT NOTE 12/24/2011  Patient:  Chris [name], Chris [name]   Account Number:  [id number]  Date Initiated:  12/24/2011  Documentation initiated by:    Subjective/Objective Assessment:     Action/Plan:   Anticipated DC Date:     Anticipated DC Plan:  HOME W HOME HEALTH SERVICES         Choice offered to / List presented to:  C-1 Patient        HH arranged  HH-1 RN      [facility] agency  [facility]   Status of service:   Medicare Important Message given?   (If response is "NO", the following Medicare IM given date fields will be blank) Date Medicare IM given:   Date Additional Medicare IM given:    Discharge Disposition:    Per UR Regulation:    If discussed at Long Length of Stay Meetings, dates discussed:    Comments:  12-24-11 Active with [facility] for RN .  MD will need an order for wound care for home health at discharge.     Ronny Flurry RN BSN [phone]

## 2011-12-25 ENCOUNTER — Other Ambulatory Visit: Payer: Self-pay | Admitting: Orthopedic Surgery

## 2011-12-25 MED ORDER — VANCOMYCIN HCL 10 G IV SOLR
2000.0000 mg | INTRAVENOUS | Status: AC
  Administered 2011-12-26: 1500 mg via INTRAVENOUS
  Filled 2011-12-25 (×2): qty 2000

## 2011-12-25 MED ORDER — INFLUENZA VIRUS VACC SPLIT PF IM SUSP
0.5000 mL | Freq: Once | INTRAMUSCULAR | Status: AC
  Administered 2011-12-27: 0.5 mL via INTRAMUSCULAR
  Filled 2011-12-25: qty 0.5

## 2011-12-25 NOTE — Progress Notes (Signed)
Subjective: 1 Day Post-Op Procedure(s) (LRB): IRRIGATION AND DEBRIDEMENT EXTREMITY (Right) Patient reports pain as 4 on 0-10 scale.    Objective: Vital signs in last 24 hours: Temp:  [97.8 F (36.6 C)-98.8 F (37.1 C)] 98.8 F (37.1 C) (12/20 1352) Pulse Rate:  [80-94] 94  (12/20 1352) Resp:  [16-18] 18  (12/20 1352) BP: (136-149)/(62-67) 149/64 mmHg (12/20 1352) SpO2:  [95 %-99 %] 97 % (12/20 1352)  Intake/Output from previous day: 12/19 0701 - 12/20 0700 In: 900 [I.V.:900] Out: 900 [Urine:900] Intake/Output this shift:    No results found for this basename: HGB:5 in the last 72 hours No results found for this basename: WBC:2,RBC:2,HCT:2,PLT:2 in the last 72 hours No results found for this basename: NA:2,K:2,CL:2,CO2:2,BUN:2,CREATININE:2,GLUCOSE:2,CALCIUM:2 in the last 72 hours No results found for this basename: LABPT:2,INR:2 in the last 72 hours  Neurovascular intact Dorsiflexion/Plantar flexion intact  Assessment/Plan: 1 Day Post-Op Procedure(s) (LRB): IRRIGATION AND DEBRIDEMENT EXTREMITY (Right) Advance diet Continue ABX therapy due to Post-op infection  Loreen Bankson F 12/25/2011, 2:01 PM

## 2011-12-25 NOTE — Clinical Documentation Improvement (Signed)
EXCISIONAL DEBRIDEMENT DOCUMENTATION CLARIFICATION  THIS DOCUMENT IS NOT A PERMANENT PART OF THE MEDICAL RECORD  TO RESPOND TO THE THIS QUERY, FOLLOW THE INSTRUCTIONS BELOW:  1. If needed, update documentation for the patient's encounter via the notes activity.  2. Access this query again and click edit on the In [employer].  3. After updating, or not, click F2 to complete all highlighted (required) fields concerning your review. Select "additional documentation in the medical record" OR "no additional documentation provided".  4. Click Sign note button.  5. The deficiency will fall out of your In Basket *Please let us know if you are not able to complete this workflow by phone or e-mail (listed below).  Please update your documentation within the medical record to reflect your response to this query.                                                                                        12/25/11   Dear Dr. Myrtie Neither / Associates,  In a better effort to capture your patient's severity of illness, reflect appropriate length of stay and utilization of resources, a review of the patient medical record has revealed the following indicators.    Based on your clinical judgment, please clarify and document in a progress note and/or discharge summary the clinical condition associated with the following supporting information:  In responding to this query please exercise your independent judgment.  The fact that a query is asked, does not imply that any particular answer is desired or expected. Because that is documentation in the medical record of "debridement" clarification is needed.   Reviewed Op note the following data is missing if possible please add this information to notes.  Thank you   Please document the below (3) key elements in a progress note:  2.  Please indicate instrument used:  Scissors, Scalpel, Curette, or other  3.  Please document depth of the debridement:          Partial Thickness  Full Thickness  Skin and Subcutaneous Tissue  Subcutaneous Tissue and Muscle  Subcutaneous Tissue, Muscle and Bone  Other  4.  Document the size of the debridement.  You may use possible, probable, or suspect with inpatient documentation. possible, probable, suspected diagnoses MUST be documented at the time of discharge  Reviewed: additional documentation in the medical record  Thank You,  Lavonda Jumbo  Clinical Documentation Specialist, BSN: Pager [phone]  Him off # [phone]  Health Information Management Con

## 2011-12-26 ENCOUNTER — Inpatient Hospital Stay (HOSPITAL_COMMUNITY): Payer: Medicare Other | Admitting: Anesthesiology

## 2011-12-26 ENCOUNTER — Inpatient Hospital Stay (HOSPITAL_COMMUNITY): Payer: Medicare Other

## 2011-12-26 ENCOUNTER — Encounter (HOSPITAL_COMMUNITY): Payer: Self-pay | Admitting: Anesthesiology

## 2011-12-26 ENCOUNTER — Encounter (HOSPITAL_COMMUNITY)
Admission: RE | Disposition: A | Discharge status: Home Health | Payer: Self-pay | Source: Ambulatory Visit | Attending: Orthopedic Surgery

## 2011-12-26 HISTORY — PX: INCISION AND DRAINAGE OF WOUND: SHX1803

## 2011-12-26 LAB — COMPREHENSIVE METABOLIC PANEL
ALT: 6 U/L (ref 0–53)
Alkaline Phosphatase: 83 U/L (ref 39–117)
BUN: 6 mg/dL (ref 6–23)
CO2: 21 mEq/L (ref 19–32)
Chloride: 97 mEq/L (ref 96–112)
GFR calc Af Amer: >90 mL/min (ref >90)
GFR calc non Af Amer: >90 mL/min (ref >90)
Glucose, Bld: 104 mg/dL — ABNORMAL HIGH (ref 70–99)
Potassium: 3.6 mEq/L (ref 3.5–5.1)
Sodium: 133 mEq/L — ABNORMAL LOW (ref 135–145)
Total Bilirubin: 0.4 mg/dL (ref 0.3–1.2)

## 2011-12-26 LAB — CBC
HCT: 28.2 % — ABNORMAL LOW (ref 39.0–52.0)
Hemoglobin: 9.1 g/dL — ABNORMAL LOW (ref 13.0–17.0)
MCHC: 32.3 g/dL (ref 30.0–36.0)
RBC: 3.66 MIL/uL — ABNORMAL LOW (ref 4.22–5.81)

## 2011-12-26 SURGERY — IRRIGATION AND DEBRIDEMENT WOUND
Anesthesia: General | Site: Hip | Laterality: Right | Wound class: Contaminated

## 2011-12-26 MED ORDER — HALOPERIDOL LACTATE 5 MG/ML IJ SOLN: 0.5000 mg | Freq: Four times a day (QID) | INTRAMUSCULAR | Status: DC | PRN

## 2011-12-26 MED ORDER — VANCOMYCIN HCL IN DEXTROSE 1-5 GM/200ML-% IV SOLN
1000.0000 mg | Freq: Two times a day (BID) | INTRAVENOUS | Status: DC
  Administered 2011-12-26 – 2011-12-27 (×2): 1000 mg via INTRAVENOUS
  Filled 2011-12-26 (×4): qty 200

## 2011-12-26 MED ORDER — CHLORHEXIDINE GLUCONATE 4 % EX LIQD
60.0000 mL | Freq: Once | CUTANEOUS | Status: AC
  Administered 2011-12-26: 4 via TOPICAL
  Filled 2011-12-26: qty 60

## 2011-12-26 MED ORDER — HYDROMORPHONE HCL PF 1 MG/ML IJ SOLN: 0.2500 mg | INTRAMUSCULAR | Status: DC | PRN

## 2011-12-26 MED ORDER — SODIUM CHLORIDE 0.9 % IR SOLN
Status: DC | PRN
  Administered 2011-12-26: 3000 mL

## 2011-12-26 MED ORDER — LIDOCAINE HCL (CARDIAC) 20 MG/ML IV SOLN
INTRAVENOUS | Status: DC | PRN
  Administered 2011-12-26: 100 mg via INTRAVENOUS

## 2011-12-26 MED ORDER — SODIUM CHLORIDE 0.9 % IR SOLN
Status: DC | PRN
  Administered 2011-12-26: 09:00:00

## 2011-12-26 MED ORDER — LACTATED RINGERS IV SOLN
INTRAVENOUS | Status: DC | PRN
  Administered 2011-12-26: 08:00:00 via INTRAVENOUS

## 2011-12-26 MED ORDER — FENTANYL CITRATE 0.05 MG/ML IJ SOLN
INTRAMUSCULAR | Status: DC | PRN
  Administered 2011-12-26: 75 ug via INTRAVENOUS
  Administered 2011-12-26: 50 ug via INTRAVENOUS
  Administered 2011-12-26: 125 ug via INTRAVENOUS
  Administered 2011-12-26 (×2): 50 ug via INTRAVENOUS

## 2011-12-26 MED ORDER — HALOPERIDOL 0.5 MG PO TABS
0.5000 mg | ORAL_TABLET | Freq: Four times a day (QID) | ORAL | Status: DC | PRN
  Filled 2011-12-26: qty 1

## 2011-12-26 MED ORDER — ONDANSETRON HCL 4 MG/2ML IJ SOLN: 4.0000 mg | Freq: Once | INTRAMUSCULAR | Status: DC | PRN

## 2011-12-26 MED ORDER — LIDOCAINE HCL 4 % MT SOLN
OROMUCOSAL | Status: DC | PRN
  Administered 2011-12-26: 4 mL via TOPICAL

## 2011-12-26 MED ORDER — SODIUM CHLORIDE 0.9 % IR SOLN
Status: DC | PRN
  Administered 2011-12-26: 1000 mL

## 2011-12-26 MED ORDER — PHENYLEPHRINE HCL 10 MG/ML IJ SOLN
10.0000 mg | INTRAVENOUS | Status: DC | PRN
  Administered 2011-12-26: 20 ug/min via INTRAVENOUS

## 2011-12-26 MED ORDER — PROPOFOL 10 MG/ML IV BOLUS
INTRAVENOUS | Status: DC | PRN
  Administered 2011-12-26: 150 mg via INTRAVENOUS

## 2011-12-26 SURGICAL SUPPLY — 39 items
BAG DECANTER FOR FLEXI CONT (MISCELLANEOUS) ×1 IMPLANT
CLOTH BEACON ORANGE TIMEOUT ST (SAFETY) ×2 IMPLANT
COVER SURGICAL LIGHT HANDLE (MISCELLANEOUS) ×2 IMPLANT
DRAPE ORTHO SPLIT 77X108 STRL (DRAPES) ×4
DRAPE SURG ORHT 6 SPLT 77X108 (DRAPES) ×2 IMPLANT
DRAPE U-SHAPE 47X51 STRL (DRAPES) ×2 IMPLANT
DRSG ADAPTIC 3X8 NADH LF (GAUZE/BANDAGES/DRESSINGS) ×1 IMPLANT
DRSG MEPILEX BORDER 4X8 (GAUZE/BANDAGES/DRESSINGS) ×2 IMPLANT
DRSG PAD ABDOMINAL 8X10 ST (GAUZE/BANDAGES/DRESSINGS) ×1 IMPLANT
DURAPREP 26ML APPLICATOR (WOUND CARE) ×2 IMPLANT
ELECT CAUTERY BLADE 6.4 (BLADE) ×1 IMPLANT
ELECT REM PT RETURN 9FT ADLT (ELECTROSURGICAL) ×2
ELECTRODE REM PT RTRN 9FT ADLT (ELECTROSURGICAL) IMPLANT
GLOVE ECLIPSE 9.0 STRL (GLOVE) ×2 IMPLANT
GLOVE SS PI 9.0 STRL (GLOVE) ×2 IMPLANT
GOWN PREVENTION PLUS XLARGE (GOWN DISPOSABLE) ×2 IMPLANT
GOWN STRL NON-REIN LRG LVL3 (GOWN DISPOSABLE) ×2 IMPLANT
HANDPIECE INTERPULSE COAX TIP (DISPOSABLE) ×2
IV NS IRRIG 3000ML ARTHROMATIC (IV SOLUTION) ×1 IMPLANT
KIT BASIN OR (CUSTOM PROCEDURE TRAY) ×2 IMPLANT
KIT ROOM TURNOVER OR (KITS) ×2 IMPLANT
MANIFOLD NEPTUNE II (INSTRUMENTS) ×2 IMPLANT
NS IRRIG 1000ML POUR BTL (IV SOLUTION) ×2 IMPLANT
PACK TOTAL JOINT (CUSTOM PROCEDURE TRAY) ×2 IMPLANT
PAD ARMBOARD 7.5X6 YLW CONV (MISCELLANEOUS) ×4 IMPLANT
SET HNDPC FAN SPRY TIP SCT (DISPOSABLE) IMPLANT
SPONGE GAUZE 4X4 12PLY (GAUZE/BANDAGES/DRESSINGS) ×1 IMPLANT
SPONGE LAP 18X18 X RAY DECT (DISPOSABLE) ×1 IMPLANT
STAPLER VISISTAT 35W (STAPLE) ×1 IMPLANT
SUT ETHILON 2 LR (SUTURE) ×3 IMPLANT
SUT VIC AB 0 CT1 27 (SUTURE) ×6
SUT VIC AB 0 CT1 27XBRD ANBCTR (SUTURE) IMPLANT
SUT VIC AB 2-0 CT1 27 (SUTURE) ×4
SUT VIC AB 2-0 CT1 TAPERPNT 27 (SUTURE) IMPLANT
TOWEL OR 17X24 6PK STRL BLUE (TOWEL DISPOSABLE) ×2 IMPLANT
TOWEL OR 17X26 10 PK STRL BLUE (TOWEL DISPOSABLE) ×3 IMPLANT
TUBE ANAEROBIC SPECIMEN COL (MISCELLANEOUS) IMPLANT
UNDERPAD 30X30 INCONTINENT (UNDERPADS AND DIAPERS) ×1 IMPLANT
WATER STERILE IRR 1000ML POUR (IV SOLUTION) ×2 IMPLANT

## 2011-12-26 NOTE — Progress Notes (Signed)
ANTIBIOTIC CONSULT NOTE - INITIAL  Pharmacy Consult for Vancomycin  Indication: Infected Right Hip, s/p I & D  Allergies  Allergen Reactions  . Penicillins Nausea And Vomiting    Patient Measurements:  Vital Signs: Temp: 97.6 F (36.4 C) (12/21 0945) BP: 136/66 mmHg (12/21 0945) Pulse Rate: 105  (12/21 0945) Intake/Output from previous day:   Intake/Output from this shift: Total I/O In: 800 [I.V.:700; Other:100] Out: 950 [Urine:900; Blood:50]  Labs:  [facility] 12/26/11 1040  WBC 8.3  HGB 9.1*  PLT 347  LABCREA --  CREATININE 0.72   The CrCl is unknown because both a height and weight (above a minimum accepted value) are required for this calculation. No results found for this basename: VANCOTROUGH:2,VANCOPEAK:2,VANCORANDOM:2,GENTTROUGH:2,GENTPEAK:2,GENTRANDOM:2,TOBRATROUGH:2,TOBRAPEAK:2,TOBRARND:2,AMIKACINPEAK:2,AMIKACINTROU:2,AMIKACIN:2, in the last 72 hours   Microbiology: Recent Results (from the past 720 hour(s))  SURGICAL PCR SCREEN     Status: Abnormal   Collection Time   12/22/11 12:44 PM      Component Value Range Status Comment   MRSA, PCR NEGATIVE  NEGATIVE Final    Staphylococcus aureus POSITIVE (*) NEGATIVE Final   CULTURE, ROUTINE-ABSCESS     Status: Normal (Preliminary result)   Collection Time   12/24/11  8:30 AM      Component Value Range Status Comment   Specimen Description ABSCESS   Final    Special Requests INFECTED TROCANTERIC BURSA RIGHT HIP   Final    Gram Stain     Final    Value: RARE WBC PRESENT, PREDOMINANTLY PMN     NO SQUAMOUS EPITHELIAL CELLS SEEN     NO ORGANISMS SEEN   Culture NO GROWTH 2 DAYS   Final    Report Status PENDING   Incomplete   ANAEROBIC CULTURE     Status: Normal (Preliminary result)   Collection Time   12/24/11  8:30 AM      Component Value Range Status Comment   Specimen Description ABSCESS   Final    Special Requests INFECTED TROCANTERIC BURSA RIGHT HIP   Final    Gram Stain     Final    Value: RARE WBC  PRESENT, PREDOMINANTLY PMN     NO SQUAMOUS EPITHELIAL CELLS SEEN     NO ORGANISMS SEEN   Culture     Final    Value: NO ANAEROBES ISOLATED; CULTURE IN PROGRESS FOR 5 DAYS   Report Status PENDING   Incomplete     Medical History: Past Medical History  Diagnosis Date  . Poor dentition   . COPD (chronic obstructive pulmonary disease)   . CHF (congestive heart failure)   . History of blood transfusion 08/19/2011    "first time"  . Prostate cancer 2011    seed implant  . Atrial flutter     h/o; successful cardioversion,TEE guided  . Iron deficiency anemia     HX BLOOD TX  . Aortic valve regurgitation     moderate AR by echo 2010  . Mitral valve regurgitation     moderate to severe by 2D, mod by TEE '10    Medications:  Scheduled:    . aspirin EC  325 mg Oral Q breakfast  . [COMPLETED] chlorhexidine  60 mL Topical Once  . [COMPLETED] chlorhexidine  60 mL Topical Once  . docusate sodium  100 mg Oral BID  . ferrous fumarate-b12-vitamic C-folic acid  1 capsule Oral Daily  . furosemide  40 mg Oral Daily  . [EXPIRED] influenza  inactive virus vaccine  0.5 mL Intramuscular Tomorrow-1000  .  influenza  inactive virus vaccine  0.5 mL Intramuscular Once  . OxyCODONE  10 mg Oral Q12H  . [COMPLETED] vancomycin  2,000 mg Intravenous 60 min Pre-Op   Assessment: 71 y/o M with 3 month h/o draining hip wound now s/p I and D with wound closure. Pharmacy consulted to dose vancomycin. Wt ~ 70kg, 70 inches, Scr stable at 0.72, CrCl ~ 90, WBC 8.3, afebrile.   12/21 Vanco >> 12/21 Vanco 2 g x 1 pre-op  Goal of Therapy:  Vancomycin trough level 15-20 mcg/ml  Plan:  - Vancomycin 1000mg  IV q12h to start tonight at 2000 - Vancomycin trough at steady state - Trend WBC, renal function, cultures - Follow clinical progression, MD plans  Abran Duke, PharmD Clinical Pharmacist Phone: [phone] Pager: [phone] 12/26/2011 12:39 PM

## 2011-12-26 NOTE — Brief Op Note (Signed)
12/24/2011 - 12/26/2011  8:56 AM  PATIENT:  Chris [name]  71 y.o. male  PRE-OPERATIVE DIAGNOSIS:  Open Wound Right Hip  POST-OPERATIVE DIAGNOSIS:  infected open wound right hip  PROCEDURE:  Procedure(s) (LRB) with comments: IRRIGATION AND DEBRIDEMENT WOUND (Right) - I&D of Wound Right Hip and Wound Closure  SURGEON:  Surgeon(s) and Role:    * Kennieth Rad, MD - Primary  PHYSICIAN ASSISTANT:   ASSISTANTS: none   ANESTHESIA:   general  EBL:  Total I/O In: -  Out: 950 [Urine:900; Blood:50]  BLOOD ADMINISTERED:none  DRAINS: none   LOCAL MEDICATIONS USED:  NONE  SPECIMEN:  No Specimen  DISPOSITION OF SPECIMEN:  N/A  COUNTS:  YES  TOURNIQUET:  * No tourniquets in log *  DICTATION: .Other Dictation: Dictation Number REPORT [id number]  PLAN OF CARE: Admit to inpatient   PATIENT DISPOSITION:  PACU - hemodynamically stable.   Delay start of Pharmacological VTE agent (>24hrs) due to surgical blood loss or risk of bleeding: not applicable

## 2011-12-26 NOTE — Anesthesia Postprocedure Evaluation (Signed)
  Anesthesia Post-op Note  Patient: Chris [name]  Procedure(s) Performed: Procedure(s) (LRB) with comments: IRRIGATION AND DEBRIDEMENT WOUND (Right) - I&D of Wound Right Hip and Wound Closure  Patient Location: PACU  Anesthesia Type:General  Level of Consciousness: awake, alert  and patient cooperative  Airway and Oxygen Therapy: Patient Spontanous Breathing  Post-op Pain: mild  Post-op Assessment: Post-op Vital signs reviewed, Patient's Cardiovascular Status Stable, Respiratory Function Stable, Patent Airway, No signs of Nausea or vomiting and Pain level controlled  Post-op Vital Signs: stable  Complications: No apparent anesthesia complications

## 2011-12-26 NOTE — Anesthesia Procedure Notes (Signed)
Procedure Name: Intubation Date/Time: 12/26/2011 7:57 AM Performed by: Alanda Amass A Pre-anesthesia Checklist: Patient identified, Timeout performed, Emergency Drugs available, Suction available and Patient being monitored Patient Re-evaluated:Patient Re-evaluated prior to inductionOxygen Delivery Method: Circle system utilized Preoxygenation: Pre-oxygenation with 100% oxygen Intubation Type: IV induction Ventilation: Mask ventilation without difficulty Laryngoscope Size: Mac and 3 Grade View: Grade I Tube type: Oral Tube size: 7.5 mm Number of attempts: 1 Airway Equipment and Method: Stylet Secured at: 21 cm Tube secured with: Tape Dental Injury: Teeth and Oropharynx as per pre-operative assessment

## 2011-12-26 NOTE — Progress Notes (Signed)
Physical Therapy Evaluation Patient Details Name: Chris [name] MRN: [id number] DOB: 1940-12-24 Today's Date: 12/26/2011 Time: 4098-1191 PT Time Calculation (min): 10 min  PT Assessment / Plan / Recommendation Clinical Impression  PT came into the room unexpectedly due to pts bed alarm sounding.  Pt wanting to urinate and PT assisted pt to stand in order to use urinal as he did not want to use it seated.  Basic evaluation performed during this time for mobility.  Pt willneed to have full gait assessment next session and if pt not coginively improved will need find family member to discuss pts premorbid status.  Did not ambulate pt further today secondary to just having surgery this AM.    PT Assessment  Patient needs continued PT services    Follow Up Recommendations  Other (comment) (TBD after next session)    Does the patient have the potential to tolerate intense rehabilitation      Barriers to Discharge Other (comment) (Possibly cognition)      Equipment Recommendations  Other (comment) (TBD after next session)    Recommendations for Other Services     Frequency Min 4X/week    Precautions / Restrictions Restrictions Weight Bearing Restrictions: Yes RLE Weight Bearing: Weight bearing as tolerated   Pertinent Vitals/Pain Pt did not demonstrate any signs of pain       Mobility  Bed Mobility Bed Mobility: Sit to Supine Sit to Supine: 4: Min assist Details for Bed Mobility Assistance: Assist with RLE into bed Transfers Transfers: Sit to Stand;Stand to Sit Sit to Stand: 4: Min assist;From bed;With upper extremity assist Stand to Sit: 4: Min guard;With upper extremity assist;To bed Details for Transfer Assistance: Verbal and tactile cues for technique Ambulation/Gait Ambulation/Gait Assistance: Other (comment) (Took 4 sideways steps to get to head of bed, towards Right) Ambulation Distance (Feet): 2 Feet Assistive device: Other (Comment) (Held to PT's arms)               PT Diagnosis: Difficulty walking  PT Problem List: Decreased strength;Decreased activity tolerance;Decreased knowledge of use of DME;Decreased cognition PT Treatment Interventions: DME instruction;Gait training;Therapeutic exercise;Therapeutic activities;Patient/family education   PT Goals Acute Rehab PT Goals PT Goal Formulation: Patient unable to participate in goal setting Time For Goal Achievement: 01/02/12 Potential to Achieve Goals: Good Pt will go Supine/Side to Sit: with supervision PT Goal: Supine/Side to Sit - Progress: Goal set today Pt will go Sit to Stand: with supervision PT Goal: Sit to Stand - Progress: Goal set today Pt will Ambulate: >150 feet;with supervision;with least restrictive assistive device PT Goal: Ambulate - Progress: Goal set today  Visit Information  Last PT Received On: 12/26/11 Assistance Needed: +1    Subjective Data  Patient Stated Goal: none stated   Prior Functioning  Home Living Lives With: Other (Comment) (No family present to discuss living situation) Communication Communication: No difficulties    Cognition  Overall Cognitive Status: Impaired Area of Impairment: Safety/judgement;Awareness of deficits Arousal/Alertness: Awake/alert Orientation Level: Disoriented to;Place;Time;Situation Behavior During Session: Restless Safety/Judgement: Decreased safety judgement for tasks assessed;Impulsive;Decreased awareness of need for assistance Safety/Judgement - Other Comments: Pt attempting to climb out of bed without assistance    Extremity/Trunk Assessment Right Upper Extremity Assessment RUE ROM/Strength/Tone: [facility] for tasks assessed Left Upper Extremity Assessment LUE ROM/Strength/Tone: [facility] for tasks assessed Right Lower Extremity Assessment RLE ROM/Strength/Tone: Deficits;Unable to fully assess Left Lower Extremity Assessment LLE ROM/Strength/Tone: [facility] for tasks assessed   Balance    End of Session PT - End  of  Session Activity Tolerance: Patient tolerated treatment well Patient left: in bed;with bed alarm set;with call bell/phone within reach Nurse Communication: Mobility status (and that pt attempting to get up without assist)       Donnella Sham 12/26/2011, 5:17 PM Lavona [place]ound, PT  [phone] 12/26/2011

## 2011-12-26 NOTE — Transfer of Care (Signed)
Immediate Anesthesia Transfer of Care Note  Patient: Chris [name]  Procedure(s) Performed: Procedure(s) (LRB) with comments: IRRIGATION AND DEBRIDEMENT WOUND (Right) - I&D of Wound Right Hip and Wound Closure  Patient Location: PACU  Anesthesia Type:General  Level of Consciousness: awake  Airway & Oxygen Therapy: Patient Spontanous Breathing  Post-op Assessment: Report given to PACU RN and Post -op Vital signs reviewed and stable  Post vital signs: Reviewed and stable  Complications: No apparent anesthesia complications

## 2011-12-26 NOTE — Anesthesia Preprocedure Evaluation (Addendum)
Anesthesia Evaluation  Patient identified by MRN, date of birth, ID band Patient awake and Patient confused    Reviewed: Allergy & Precautions, H&P , NPO status , Patient's Chart, lab work & pertinent test results  Airway Mallampati: II TM Distance: >3 FB Neck ROM: Full    Dental  (+) Poor Dentition, Missing, Loose and Dental Advisory Given   Pulmonary COPD COPD inhaler,          Cardiovascular +CHF + dysrhythmias Atrial Fibrillation + Valvular Problems/Murmurs AI and MR Rhythm:regular Rate:Normal     Neuro/Psych    GI/Hepatic   Endo/Other    Renal/GU      Musculoskeletal   Abdominal   Peds  Hematology   Anesthesia Other Findings Oriented to person and place.  Reproductive/Obstetrics                        Anesthesia Physical Anesthesia Plan  ASA: III  Anesthesia Plan: General   Post-op Pain Management:    Induction: Intravenous  Airway Management Planned: Oral ETT  Additional Equipment:   Intra-op Plan:   Post-operative Plan: Extubation in OR  Informed Consent: I have reviewed the patients History and Physical, chart, labs and discussed the procedure including the risks, benefits and alternatives for the proposed anesthesia with the patient or authorized representative who has indicated his/her understanding and acceptance.   Dental advisory given  Plan Discussed with: CRNA, Anesthesiologist and Surgeon  Anesthesia Plan Comments:        Anesthesia Quick Evaluation

## 2011-12-26 NOTE — Preoperative (Signed)
Beta Blockers   Reason not to administer Beta Blockers:Not Applicable 

## 2011-12-26 NOTE — Op Note (Signed)
NAMEPHAT, DALTON NO.:  [id number]  MEDICAL RECORD NO.:  [id number]  LOCATION:  [id number]                        FACILITY:  [facility]  PHYSICIAN:  Myrtie Neither, MD      DATE OF BIRTH:  [birth year]  DATE OF PROCEDURE:  12/26/2011 DATE OF DISCHARGE:                              OPERATIVE REPORT   PREOPERATIVE DIAGNOSIS:  Open infection, right hip wound.  POSTOPERATIVE DIAGNOSIS:  Open infection, right hip wound.  OPERATION:  Irrigation, excisional debridement, and wound closure, right hip.  ANESTHESIA:  General.  The patient was taken to the operating room.  After given adequate preop medications, given general anesthesia and intubated.  The patient was placed in right lateral position.  Right hip was painted with Betadine and draped in sterile manner.  Simpulse irrigator was used with saline to irrigate the wound.  Excisional debridement was done about the skin edges and further into subcutaneous and fascial tissue.  After adequate debridement, further irrigation with antibiotic solution was done. Wound closure was then done with 0 Vicryl for the fascia, 2-0 Vicryl for the subcutaneous, and #2 nylon for skin as well as staples.  Compressive dressing was applied.  The patient tolerated the procedure quite well and went to recovery room in stable and satisfactory condition.     Myrtie Neither, MD     AC/MEDQ  D:  12/26/2011  T:  12/26/2011  Job:  [id number]

## 2011-12-27 ENCOUNTER — Other Ambulatory Visit: Payer: Self-pay | Admitting: Orthopedic Surgery

## 2011-12-27 LAB — CULTURE, ROUTINE-ABSCESS: Culture: NO GROWTH

## 2011-12-27 LAB — URINALYSIS, ROUTINE W REFLEX MICROSCOPIC
Bilirubin Urine: NEGATIVE
Ketones, ur: NEGATIVE mg/dL
Nitrite: NEGATIVE
Urobilinogen, UA: 1 mg/dL (ref 0.0–1.0)

## 2011-12-27 MED ORDER — MUPIROCIN 2 % EX OINT
TOPICAL_OINTMENT | Freq: Two times a day (BID) | CUTANEOUS | Status: DC
  Administered 2011-12-27 – 2011-12-28 (×3): via NASAL
  Filled 2011-12-27: qty 22

## 2011-12-27 NOTE — Progress Notes (Signed)
Pt continues to be disoriented to place and time. He has pulled the mepilex dressings off his incision x 2. Area cleaned and dressing reapplied. Staples intact. Pt also pulled out his IV after his antibiotic had infused. Also snapped the IV line in two. Bed alarms on since pt continues to try to get out of bed on his own.

## 2011-12-27 NOTE — Progress Notes (Signed)
Subjective: 1 Day Post-Op Procedure(s) (LRB): IRRIGATION AND DEBRIDEMENT WOUND (Right) Patient reports pain as 3 on 0-10 scale.    Objective: Vital signs in last 24 hours: Temp:  [98.6 F (37 C)-98.7 F (37.1 C)] 98.6 F (37 C) (12/22 1551) Pulse Rate:  [85-110] 85  (12/22 1551) Resp:  [16-18] 16  (12/22 1600) BP: (140-145)/(63-68) 140/63 mmHg (12/22 1551) SpO2:  [97 %-99 %] 99 % (12/22 1600)  Intake/Output from previous day: 12/21 0701 - 12/22 0700 In: 1615 [P.O.:240; I.V.:1075; IV Piggyback:200] Out: 1400 [Urine:1350; Blood:50] Intake/Output this shift: Total I/O In: 720 [P.O.:720] Out: 1550 [Urine:1550]   Basename 12/26/11 1040  HGB 9.1*    Basename 12/26/11 1040  WBC 8.3  RBC 3.66*  HCT 28.2*  PLT 347    Basename 12/26/11 1040  NA 133*  K 3.6  CL 97  CO2 21  BUN 6  CREATININE 0.72  GLUCOSE 104*  CALCIUM 8.9   No results found for this basename: LABPT:2,INR:2 in the last 72 hours  Neurovascular intact Dorsiflexion/Plantar flexion intact  Assessment/Plan: 1 Day Post-Op Procedure(s) (LRB): IRRIGATION AND DEBRIDEMENT WOUND (Right) Up with therapy Discharge home with home health  Kennieth Rad 12/27/2011, 6:58 PM

## 2011-12-27 NOTE — Progress Notes (Signed)
Physical Therapy Treatment Patient Details Name: Chris [name] MRN: [id number] DOB: 21-Sep-1940 Today's Date: 12/27/2011 Time: 4098-1191 PT Time Calculation (min): 12 min  PT Assessment / Plan / Recommendation Comments on Treatment Session  Pt still with limited participation this session, only willing to ambulate to/from bathroom. Pt still confused and does not know why he is here. Will continue therapy, recommending SNF    Follow Up Recommendations  SNF     Does the patient have the potential to tolerate intense rehabilitation     Barriers to Discharge        Equipment Recommendations  Rolling walker with 5" wheels    Recommendations for Other Services    Frequency Min 4X/week   Plan Discharge plan needs to be updated;Frequency remains appropriate    Precautions / Restrictions Precautions Precautions: Fall Restrictions Weight Bearing Restrictions: Yes RLE Weight Bearing: Weight bearing as tolerated   Pertinent Vitals/Pain Pt complaining of pain in R hip, no number given.     Mobility  Bed Mobility Bed Mobility: Sit to Supine;Supine to Sit Supine to Sit: 4: Min assist Sit to Supine: 4: Min assist Details for Bed Mobility Assistance: Assist with RLE in.out of bed as well as assistance for support Transfers Transfers: Sit to Stand;Stand to Sit Sit to Stand: 4: Min assist;From bed;With upper extremity assist Stand to Sit: 4: Min guard;With upper extremity assist;To bed Details for Transfer Assistance: Verbal and tactile cues for technique Ambulation/Gait Ambulation/Gait Assistance: 1: +2 Total assist Ambulation/Gait: Patient Percentage: 60% Ambulation Distance (Feet): 20 Feet Assistive device: 2 person hand held assist Ambulation/Gait Assistance Details: VC for proper sequencing. 2 person HHA for safety, will attempt RW next session.  Gait Pattern: Step-to pattern;Decreased step length - left;Decreased stance time - right;Decreased hip/knee flexion - right;Narrow  base of support;Trunk flexed Gait velocity: slow speed    Exercises     PT Diagnosis:    PT Problem List:   PT Treatment Interventions:     PT Goals Acute Rehab PT Goals PT Goal: Supine/Side to Sit - Progress: Progressing toward goal PT Goal: Sit to Stand - Progress: Progressing toward goal PT Goal: Ambulate - Progress: Progressing toward goal  Visit Information  Last PT Received On: 12/27/11 Assistance Needed: +1    Subjective Data      Cognition  Overall Cognitive Status: Impaired Area of Impairment: Safety/judgement;Awareness of deficits Arousal/Alertness: Awake/alert Orientation Level: Disoriented to;Place;Time;Situation Behavior During Session: Restless Safety/Judgement: Decreased safety judgement for tasks assessed;Impulsive;Decreased awareness of need for assistance Safety/Judgement - Other Comments: Pt unaware of need for assistance     Balance     End of Session PT - End of Session Equipment Utilized During Treatment: Gait belt Activity Tolerance: Patient limited by pain Patient left: in bed;with bed alarm set;with call bell/phone within reach;Other (comment) (sitter in room) Nurse Communication: Mobility status   GP     Milana Kidney 12/27/2011, 11:32 AM

## 2011-12-28 ENCOUNTER — Encounter (HOSPITAL_COMMUNITY): Payer: Self-pay | Admitting: Orthopedic Surgery

## 2011-12-28 MED ORDER — ASPIRIN 325 MG PO TBEC: 325.0000 mg | DELAYED_RELEASE_TABLET | Freq: Every day | ORAL | Status: DC

## 2011-12-28 MED ORDER — HALOPERIDOL 0.5 MG PO TABS: 0.5000 mg | ORAL_TABLET | Freq: Four times a day (QID) | ORAL | Status: DC | PRN

## 2011-12-28 MED ORDER — CLINDAMYCIN HCL 300 MG PO CAPS: 300.0000 mg | ORAL_CAPSULE | Freq: Three times a day (TID) | ORAL | Status: DC

## 2011-12-28 MED ORDER — OXYCODONE HCL ER 10 MG PO T12A: 10.0000 mg | EXTENDED_RELEASE_TABLET | Freq: Two times a day (BID) | ORAL | Status: DC

## 2011-12-28 NOTE — Discharge Summary (Signed)
NAMEZACKRY, Chris [name] NO.:  [id number]  MEDICAL RECORD NO.:  [id number]  LOCATION:  5N05C                        FACILITY:  [facility]  PHYSICIAN:  Myrtie Neither, MD      DATE OF BIRTH:  1940/08/30  DATE OF ADMISSION:  12/23/2011 DATE OF DISCHARGE:  12/28/2011                              DISCHARGE SUMMARY   ADMITTING DIAGNOSIS:  Open wound, infected right hip wound.  DISCHARGE DIAGNOSIS:  Open wound, infected right hip wound with staphylococcal infection.  COMPLICATIONS:  None.  INFECTIONS:  Open infected right hip wound.  OPERATIONS: 1. Open excision and debridement of right hip. 2. Incision and debridement, right hip and irrigation and debridement     with wound closure.  PERTINENT HISTORY:  This is a 71 year old male, who presented to the office approximately 2 weeks ago with an open wound that had been going on and had been treated by primary care doctor several weeks without any improvement, continued drainage from the area.  The patient had been put on antibiotics and was instructed to discontinue the antibiotics until time of surgery to find out what particular bacterium we have to deal with.  The patient had C-reactive protein done which was elevated.  Sed rate was elevated.  CBC did not reveal any left shift.  The patient had x-rays.  It did show any evidence of osteomyelitis as well as CT scan did not show any evidence of osteomyelitis or loosening of his total hip implant which was intact.  Pertinent physical was that of the right hip to which demonstrated serous draining wound over the trochanter and previous surgical scar. The right hip __________ range of motion was still good.  HOSPITAL COURSE:  The patient underwent preop laboratory, CBC, EKG, chest x-ray, PT, PTT, platelet count, CMET, UA.  The patient's labs were stable enough to undergo surgery.  The patient underwent incision and debridement of right hip wound which was found to be down  to the trochanter involving the trochanteric bursa with abscesses.  The patient had the wound packed and taken back to the operating room.  Again had excision and debridement and irrigation of the same wound.  Wound was found to be clean enough to close.  The patient had wound closure done. Compressive dressing applied.  Postoperative course, the patient had pre and postop IV and IV vancomycin.  Cultures so far were clear with no organism identified on cultures.  Previous culture on outpatient did demonstrate Staphylococcus.  The patient presently is stable enough to be discharged, started on physical therapy, weightbearing on the right side as tolerated and switched to clindamycin 300 mg t.i.d. x2 weeks. Return to the office in 2 week period.  Home health and physical therapy is arranged due to his episodic disorientation as well as wound care to the right hip.  The patient is instructed not to sleep on the right side to prevent further injury to the right hip.  MEDICATIONS:  The patient is being discharged on: 1. Ferrous fum-iron. 2. Polysacch-FA 162/115.2, 1 capsule daily. 3. Lasix 40 mg daily. 4. Aspirin 325 mg daily. 5. Haldol 0.5 mg q.6 p.r.n. 6. Oxycodone 10 mg q.12 p.r.n.  Weightbearing as tolerated on the right side.  Daily dressing changes to the right hip.  The patient is not to do any driving and no lifting. The patient will be seen back in the office in 2 weeks.  The patient is being discharged in stable and satisfactory condition.     Myrtie Neither, MD     AC/MEDQ  D:  12/28/2011  T:  12/28/2011  Job:  [id number]

## 2011-12-28 NOTE — Clinical Documentation Improvement (Signed)
CHANGE MENTAL STATUS DOCUMENTATION CLARIFICATION   THIS DOCUMENT IS NOT A PERMANENT PART OF THE MEDICAL RECORD  TO RESPOND TO THE THIS QUERY, FOLLOW THE INSTRUCTIONS BELOW:  1. If needed, update documentation for the patient's encounter via the notes activity.  2. Access this query again and click edit on the In [employer].  3. After updating, or not, click F2 to complete all highlighted (required) fields concerning your review. Select "additional documentation in the medical record" OR "no additional documentation provided".  4. Click Sign note button.  5. The deficiency will fall out of your In Basket *Please let us know if you are not able to complete this workflow by phone or e-mail (listed below).         12/28/11  Dear Dr.  Thelma Comp Marton Redwood  In an effort to better capture your patient's severity of illness, reflect appropriate length of stay and utilization of resources, a review of the patient medical record has revealed the following indicators.    Based on your clinical judgment, please clarify and document in a progress note and/or discharge summary the clinical condition associated with the following supporting information:  In responding to this query please exercise your independent judgment.  The fact that a query is asked, does not imply that any particular answer is desired or expected.   Per H + P patient alert and appropriate  to answer questions but has h/o "demetia"  prior to surgery, per nursing notes patient "pulling out IV"  "snapping IV in two"  "pulling off mepilex drsng" and "continues to try and get out of bed" if possible please document diagnosis for patient's mental state post surgery.  Thank you   Possible Clinical Conditions?  Encephalopathy ( Metabolic  and/or Toxic)                                       Drug induced confusion/delirium  Acute confusion  Acute delirium  Acute exacerbation of known dementia   Hyponatremia /  Hypernatremia  Other Condition  Cannot Clinically Determine     Risk Factors: s/p irrigation and debridement and wound closure infected rt hip, afib, copd  Signs & Symptoms: see nursing note doc above, PT notes documenting disorientation/ confusion rec SNF    Diagnostics:  Lab:  U/a neg, cx pending , NA 133    episodic disorientation in d/c summary   Reviewed:   Thank You,  Chris [name]  Clinical Documentation Specialist, BSN: Pager  [phone]  Health Information Management [place]

## 2011-12-28 NOTE — Progress Notes (Signed)
Physical Therapy Treatment Patient Details Name: Chris [name] MRN: [id number] DOB: 01/03/1941 Today's Date: 12/28/2011 Time: 0902-0925 PT Time Calculation (min): 23 min  PT Assessment / Plan / Recommendation Comments on Treatment Session  Patient still showing signs of confusion and disorientation. Patient incontinent in bed and required assistance to acknowledge and provide pericare. REcommend SNF as unsure if patient has assistance at home. Patient would need 24/7 care at home    Follow Up Recommendations  SNF     Does the patient have the potential to tolerate intense rehabilitation     Barriers to Discharge        Equipment Recommendations  Rolling walker with 5" wheels    Recommendations for Other Services    Frequency Min 4X/week   Plan Discharge plan remains appropriate;Frequency remains appropriate    Precautions / Restrictions Precautions Precautions: Fall Restrictions RLE Weight Bearing: Weight bearing as tolerated   Pertinent Vitals/Pain     Mobility  Bed Mobility Supine to Sit: 4: Min guard;With rails Sit to Supine: 4: Min guard;With rail Details for Bed Mobility Assistance: MinGuard for safety Transfers Sit to Stand: 4: Min guard;With upper extremity assist;From bed Stand to Sit: 4: Min guard;With upper extremity assist;To chair/3-in-1;To bed Details for Transfer Assistance: Patient stood x2 with continuous cues for safe technique as patient had no recall of previous instructions Ambulation/Gait Ambulation/Gait Assistance: 4: Min assist Ambulation Distance (Feet): 180 Feet Assistive device: Rolling walker Ambulation/Gait Assistance Details: A required for RW management and stability. Cues for manuvering around obstacles and for positioning inside of RW Gait Pattern: Step-to pattern;Trunk flexed;Narrow base of support    Exercises     PT Diagnosis:    PT Problem List:   PT Treatment Interventions:     PT Goals Acute Rehab PT Goals PT Goal:  Supine/Side to Sit - Progress: Progressing toward goal PT Goal: Ambulate - Progress: Progressing toward goal  Visit Information  Last PT Received On: 12/28/11 Assistance Needed: +1    Subjective Data      Cognition  Overall Cognitive Status: Impaired Area of Impairment: Safety/judgement;Problem solving;Awareness of deficits Arousal/Alertness: Awake/alert Orientation Level: Disoriented to;Place;Time;Situation Behavior During Session: [facility] for tasks performed Safety/Judgement: Decreased awareness of safety precautions;Decreased awareness of need for assistance;Impulsive    Balance     End of Session PT - End of Session Equipment Utilized During Treatment: Gait belt Activity Tolerance: Patient tolerated treatment well Patient left: in chair;with call bell/phone within reach;with nursing in room Nurse Communication: Mobility status   GP     Fredrich Birks 12/28/2011, 10:42 AM 12/28/2011 Fredrich Birks PTA [phone] pager [phone] office

## 2011-12-28 NOTE — Progress Notes (Signed)
CARE MANAGEMENT NOTE 12/28/2011  Patient:  Chris [name], Chris [name]   Account Number:  [id number]  Date Initiated:  12/24/2011  Documentation initiated by:  Vance Peper  Subjective/Objective Assessment:   71 yr old male s/p I & D of right hip wound     Action/Plan:   CM spoke with patient concerning home health and DME needs at discharge.   Anticipated DC Date:  12/28/2011   Anticipated DC Plan:  HOME W HOME HEALTH SERVICES      DC Planning Services  CM consult      [facility] Choice  HOME HEALTH   Choice offered to / List presented to:  C-1 Patient        HH arranged  HH-1 RN      [facility] agency  [facility]   Status of service:  Completed, signed off Medicare Important Message given?   (If response is "NO", the following Medicare IM given date fields will be blank) Date Medicare IM given:   Date Additional Medicare IM given:    Discharge Disposition:  HOME W HOME HEALTH SERVICES  Per UR Regulation:    If discussed at Long Length of Stay Meetings, dates discussed:    Comments:  12-24-11 Active with [facility] for RN .  MD will need an order for wound care for home health at discharge.     Ronny Flurry RN BSN [phone]

## 2011-12-28 NOTE — Progress Notes (Signed)
UR COMPLETED  

## 2011-12-29 LAB — ANAEROBIC CULTURE

## 2012-01-02 NOTE — Op Note (Signed)
[name], [name] NO.:  [id number]  MEDICAL RECORD NO.:  [id number]  LOCATION:  5N05C                        FACILITY:  [facility]  PHYSICIAN:  Myrtie Neither, MD      DATE OF BIRTH:  11-10-1940  DATE OF PROCEDURE:  12/26/2011 DATE OF DISCHARGE:  12/28/2011                              OPERATIVE REPORT   ADDENDUM  PREOPERATIVE DIAGNOSIS:  Open infected wound, right hip.  POSTOPERATIVE DIAGNOSIS:  Open infected wound, right hip.  OPERATION:  Irrigation, excisional debridement, and wound closure, right hip.  ADDENDUM  Description of the wound that of an 8-inch wound over the right hip extending down to the trochanter of the right hip.  Betadine packing was removed.  Further debridement was done with a scalpel.  Wound closure was done with 0-Vicryl for the.  Wound closure was with 0-Vicryl for the fascia, 2-0 for the subcutaneous, and skin staples along with #2 nylon.     Myrtie Neither, MD     AC/MEDQ  D:  01/01/2012  T:  01/02/2012  Job:  [id number]

## 2012-01-02 NOTE — Op Note (Signed)
NAMEDARREK, LEASURE NO.:  [id number]  MEDICAL RECORD NO.:  [id number]  LOCATION:  5N05C                        FACILITY:  [facility]  PHYSICIAN:  Myrtie Neither, MD      DATE OF BIRTH:  1940-02-02  DATE OF PROCEDURE:  12/24/2011 DATE OF DISCHARGE:  12/28/2011                              OPERATIVE REPORT   ADDENDUM  PREOPERATIVE DIAGNOSIS:  Open wound, right hip and infection, right hip.  POSTOPERATIVE DIAGNOSIS:  Open wound, right hip and trochanteric infection, right hip.  ANESTHESIA:  General.  PROCEDURE:  Incision and debridement and resection of trochanteric bursa, right hip.  CULTURES:  Aerobic and anaerobic and Gram stain was done.  Wound was packed with Betadine packing.  Drain applied was Hemovac to the right hip.  ADDENDUM  Description of the wound was that of a 2-inch diameter gaping wound with serous drainage from the area over the trochanteric area from his previous surgical scar.  This wound was extended to 8 inches long.  The debridement was done with a scaffold resecting all way down and including the bursa of the trochanter of the right hip.  The abscesses that were resected were done with a scalpel and rongeur.  The wound was left open and packed with Betadine.  Gauze and Hemovac drain was placed into the wound.     Myrtie Neither, MD     AC/MEDQ  D:  01/01/2012  T:  01/02/2012  Job:  [id number]

## 2012-01-12 ENCOUNTER — Encounter (HOSPITAL_BASED_OUTPATIENT_CLINIC_OR_DEPARTMENT_OTHER): Payer: Medicare Other

## 2012-02-24 ENCOUNTER — Other Ambulatory Visit: Payer: Self-pay | Admitting: Neurology

## 2012-02-24 DIAGNOSIS — R269 Unspecified abnormalities of gait and mobility: Secondary | ICD-10-CM

## 2012-02-24 DIAGNOSIS — F0390 Unspecified dementia without behavioral disturbance: Secondary | ICD-10-CM

## 2012-03-02 ENCOUNTER — Ambulatory Visit
Admission: RE | Admit: 2012-03-02 | Discharge: 2012-03-02 | Disposition: A | Discharge status: Home / Self Care | Payer: Medicare Other | Source: Ambulatory Visit | Attending: Neurology | Admitting: Neurology

## 2012-03-02 DIAGNOSIS — R269 Unspecified abnormalities of gait and mobility: Secondary | ICD-10-CM

## 2012-03-02 DIAGNOSIS — F0390 Unspecified dementia without behavioral disturbance: Secondary | ICD-10-CM

## 2012-04-04 ENCOUNTER — Ambulatory Visit
Admission: RE | Admit: 2012-04-04 | Discharge: 2012-04-04 | Disposition: A | Discharge status: Home / Self Care | Payer: Medicare Other | Source: Ambulatory Visit | Attending: Orthopedic Surgery | Admitting: Orthopedic Surgery

## 2012-04-04 ENCOUNTER — Other Ambulatory Visit: Payer: Self-pay | Admitting: Orthopedic Surgery

## 2012-04-04 DIAGNOSIS — T148XXA Other injury of unspecified body region, initial encounter: Secondary | ICD-10-CM

## 2012-04-18 ENCOUNTER — Encounter (HOSPITAL_COMMUNITY): Payer: Self-pay | Admitting: Respiratory Therapy

## 2012-04-18 ENCOUNTER — Other Ambulatory Visit: Payer: Self-pay | Admitting: Orthopedic Surgery

## 2012-04-21 ENCOUNTER — Encounter (HOSPITAL_COMMUNITY): Payer: Self-pay

## 2012-04-21 ENCOUNTER — Encounter (HOSPITAL_COMMUNITY)
Admission: RE | Admit: 2012-04-21 | Discharge: 2012-04-21 | Disposition: A | Discharge status: Home / Self Care | Payer: Medicare Other | Source: Ambulatory Visit | Attending: Orthopedic Surgery | Admitting: Orthopedic Surgery

## 2012-04-21 HISTORY — DX: Unspecified dementia, unspecified severity, without behavioral disturbance, psychotic disturbance, mood disturbance, and anxiety: F03.90

## 2012-04-21 LAB — COMPREHENSIVE METABOLIC PANEL
AST: 15 U/L (ref 0–37)
Albumin: 3.5 g/dL (ref 3.5–5.2)
Alkaline Phosphatase: 96 U/L (ref 39–117)
Chloride: 106 mEq/L (ref 96–112)
Potassium: 3.5 mEq/L (ref 3.5–5.1)
Sodium: 140 mEq/L (ref 135–145)
Total Bilirubin: 0.3 mg/dL (ref 0.3–1.2)

## 2012-04-21 LAB — SURGICAL PCR SCREEN: Staphylococcus aureus: NEGATIVE

## 2012-04-21 LAB — CBC
Platelets: 311 10*3/uL (ref 150–400)
RDW: 16.6 % — ABNORMAL HIGH (ref 11.5–15.5)
WBC: 5.1 10*3/uL (ref 4.0–10.5)

## 2012-04-21 NOTE — Progress Notes (Addendum)
Saw Dr.Peter [place] in 2010   EKG in epic from 08-19-11 CXR in epic from 12-26-11  Dr.Hassan is Medical Md  Echo in epic from 2010  No changes in pts medical history since being evaluated by anesthesia in Dec 2010

## 2012-04-21 NOTE — Pre-Procedure Instructions (Signed)
Chris [name]  04/21/2012   Your procedure is scheduled on:  Thurs, April 24 @ 9:15 AM  Report to [facility] at 7:15 AM.  Call this number if you have problems the morning of surgery: [phone]   Remember:   Do not eat food or drink liquids after midnight.   Take these medicines the morning of surgery with A SIP OF WATER: Haldol(Haloperidol)              Stop taking your Aspirin. No Aleve,BC's,Goody's,Ibuprofen,Fish Oil,or any Herbal Medications   Do not wear jewelry  Do not wear lotions, powders, or colognes. You may wear deodorant.  Men may shave face and neck.  Do not bring valuables to the hospital.  Contacts, dentures or bridgework may not be worn into surgery.  Leave suitcase in the car. After surgery it may be brought to your room.  For patients admitted to the hospital, checkout time is 11:00 AM the day of  discharge.   Patients discharged the day of surgery will not be allowed to drive  home.    Special Instructions: Shower using CHG 2 nights before surgery and the night before surgery.  If you shower the day of surgery use CHG.  Use special wash - you have one bottle of CHG for all showers.  You should use approximately 1/3 of the bottle for each shower.   Please read over the following fact sheets that you were given: Pain Booklet, Coughing and Deep Breathing, MRSA Information and Surgical Site Infection Prevention

## 2012-04-25 ENCOUNTER — Ambulatory Visit: Payer: Self-pay | Admitting: Nurse Practitioner

## 2012-04-27 MED ORDER — VANCOMYCIN HCL IN DEXTROSE 1-5 GM/200ML-% IV SOLN
1000.0000 mg | INTRAVENOUS | Status: AC
  Administered 2012-04-28: 1000 mg via INTRAVENOUS
  Filled 2012-04-27: qty 200

## 2012-04-28 ENCOUNTER — Encounter (HOSPITAL_COMMUNITY): Payer: Self-pay | Admitting: Certified Registered Nurse Anesthetist

## 2012-04-28 ENCOUNTER — Inpatient Hospital Stay (HOSPITAL_COMMUNITY)
Admission: RE | Admit: 2012-04-28 | Discharge: 2012-05-03 | DRG: 908 | Disposition: A | Discharge status: Home Health | Payer: Medicare Other | Source: Ambulatory Visit | Attending: Orthopedic Surgery | Admitting: Orthopedic Surgery

## 2012-04-28 ENCOUNTER — Encounter (HOSPITAL_COMMUNITY): Payer: Self-pay | Admitting: *Deleted

## 2012-04-28 ENCOUNTER — Ambulatory Visit (HOSPITAL_COMMUNITY): Payer: Medicare Other | Admitting: Certified Registered Nurse Anesthetist

## 2012-04-28 ENCOUNTER — Encounter (HOSPITAL_COMMUNITY)
Admission: RE | Disposition: A | Discharge status: Home Health | Payer: Self-pay | Source: Ambulatory Visit | Attending: Orthopedic Surgery

## 2012-04-28 DIAGNOSIS — I4891 Unspecified atrial fibrillation: Secondary | ICD-10-CM | POA: Diagnosis present

## 2012-04-28 DIAGNOSIS — Y838 Other surgical procedures as the cause of abnormal reaction of the patient, or of later complication, without mention of misadventure at the time of the procedure: Secondary | ICD-10-CM | POA: Diagnosis present

## 2012-04-28 DIAGNOSIS — Z96649 Presence of unspecified artificial hip joint: Secondary | ICD-10-CM

## 2012-04-28 DIAGNOSIS — M6789 Other specified disorders of synovium and tendon, multiple sites: Secondary | ICD-10-CM | POA: Diagnosis present

## 2012-04-28 DIAGNOSIS — Z7902 Long term (current) use of antithrombotics/antiplatelets: Secondary | ICD-10-CM

## 2012-04-28 DIAGNOSIS — I080 Rheumatic disorders of both mitral and aortic valves: Secondary | ICD-10-CM | POA: Diagnosis present

## 2012-04-28 DIAGNOSIS — J4489 Other specified chronic obstructive pulmonary disease: Secondary | ICD-10-CM | POA: Diagnosis present

## 2012-04-28 DIAGNOSIS — F0390 Unspecified dementia without behavioral disturbance: Secondary | ICD-10-CM | POA: Diagnosis present

## 2012-04-28 DIAGNOSIS — I4892 Unspecified atrial flutter: Secondary | ICD-10-CM | POA: Diagnosis present

## 2012-04-28 DIAGNOSIS — G8918 Other acute postprocedural pain: Secondary | ICD-10-CM

## 2012-04-28 DIAGNOSIS — J449 Chronic obstructive pulmonary disease, unspecified: Secondary | ICD-10-CM | POA: Diagnosis present

## 2012-04-28 DIAGNOSIS — I509 Heart failure, unspecified: Secondary | ICD-10-CM | POA: Diagnosis present

## 2012-04-28 DIAGNOSIS — L980 Pyogenic granuloma: Secondary | ICD-10-CM | POA: Diagnosis present

## 2012-04-28 DIAGNOSIS — Z01812 Encounter for preprocedural laboratory examination: Secondary | ICD-10-CM

## 2012-04-28 DIAGNOSIS — D509 Iron deficiency anemia, unspecified: Secondary | ICD-10-CM | POA: Diagnosis present

## 2012-04-28 DIAGNOSIS — T8189XA Other complications of procedures, not elsewhere classified, initial encounter: Principal | ICD-10-CM | POA: Diagnosis present

## 2012-04-28 DIAGNOSIS — F17200 Nicotine dependence, unspecified, uncomplicated: Secondary | ICD-10-CM | POA: Diagnosis present

## 2012-04-28 DIAGNOSIS — Z8546 Personal history of malignant neoplasm of prostate: Secondary | ICD-10-CM

## 2012-04-28 DIAGNOSIS — Z7982 Long term (current) use of aspirin: Secondary | ICD-10-CM

## 2012-04-28 HISTORY — PX: INCISION AND DRAINAGE OF WOUND: SHX1803

## 2012-04-28 SURGERY — IRRIGATION AND DEBRIDEMENT WOUND
Anesthesia: General | Site: Hip | Laterality: Right | Wound class: Dirty or Infected

## 2012-04-28 MED ORDER — HALOPERIDOL 0.5 MG PO TABS
0.2500 mg | ORAL_TABLET | Freq: Four times a day (QID) | ORAL | Status: DC | PRN
  Administered 2012-04-29 (×2): 0.25 mg via ORAL
  Filled 2012-04-28 (×2): qty 1

## 2012-04-28 MED ORDER — VANCOMYCIN HCL IN DEXTROSE 1-5 GM/200ML-% IV SOLN
1000.0000 mg | Freq: Two times a day (BID) | INTRAVENOUS | Status: DC
  Administered 2012-04-28 – 2012-05-02 (×8): 1000 mg via INTRAVENOUS
  Filled 2012-04-28 (×12): qty 200

## 2012-04-28 MED ORDER — CHLORHEXIDINE GLUCONATE 4 % EX LIQD
60.0000 mL | Freq: Once | CUTANEOUS | Status: DC
Start: 2012-04-28 — End: 2012-04-28

## 2012-04-28 MED ORDER — FUROSEMIDE 20 MG PO TABS
20.0000 mg | ORAL_TABLET | Freq: Every day | ORAL | Status: DC
  Administered 2012-04-28 – 2012-05-03 (×5): 20 mg via ORAL
  Filled 2012-04-28 (×6): qty 1

## 2012-04-28 MED ORDER — LACTATED RINGERS IV SOLN
INTRAVENOUS | Status: DC
  Administered 2012-04-28: 09:00:00 via INTRAVENOUS

## 2012-04-28 MED ORDER — ACETAMINOPHEN 10 MG/ML IV SOLN: 1000.0000 mg | Freq: Once | INTRAVENOUS | Status: DC | PRN

## 2012-04-28 MED ORDER — SODIUM CHLORIDE 0.9 % IR SOLN
Status: DC | PRN
  Administered 2012-04-28 (×2)

## 2012-04-28 MED ORDER — LIDOCAINE HCL (CARDIAC) 20 MG/ML IV SOLN
INTRAVENOUS | Status: DC | PRN
  Administered 2012-04-28: 60 mg via INTRAVENOUS

## 2012-04-28 MED ORDER — LACTATED RINGERS IV SOLN
INTRAVENOUS | Status: DC | PRN
  Administered 2012-04-28: 09:00:00 via INTRAVENOUS

## 2012-04-28 MED ORDER — OXYCODONE HCL 5 MG PO TABS
5.0000 mg | ORAL_TABLET | ORAL | Status: DC | PRN
  Administered 2012-04-29 – 2012-05-01 (×5): 5 mg via ORAL
  Filled 2012-04-28 (×5): qty 1

## 2012-04-28 MED ORDER — PHENYLEPHRINE HCL 10 MG/ML IJ SOLN
INTRAMUSCULAR | Status: DC | PRN
  Administered 2012-04-28 (×5): 80 ug via INTRAVENOUS

## 2012-04-28 MED ORDER — EPHEDRINE SULFATE 50 MG/ML IJ SOLN
INTRAMUSCULAR | Status: DC | PRN
  Administered 2012-04-28: 10 mg via INTRAVENOUS

## 2012-04-28 MED ORDER — PROPOFOL 10 MG/ML IV BOLUS
INTRAVENOUS | Status: DC | PRN
  Administered 2012-04-28: 170 mg via INTRAVENOUS

## 2012-04-28 MED ORDER — FENTANYL CITRATE 0.05 MG/ML IJ SOLN
INTRAMUSCULAR | Status: DC | PRN
  Administered 2012-04-28: 50 ug via INTRAVENOUS

## 2012-04-28 MED ORDER — MELOXICAM 15 MG PO TABS
15.0000 mg | ORAL_TABLET | Freq: Every day | ORAL | Status: DC
  Administered 2012-04-28 – 2012-05-03 (×5): 15 mg via ORAL
  Filled 2012-04-28 (×6): qty 1

## 2012-04-28 MED ORDER — ONDANSETRON HCL 4 MG/2ML IJ SOLN
INTRAMUSCULAR | Status: DC | PRN
  Administered 2012-04-28: 4 mg via INTRAVENOUS

## 2012-04-28 MED ORDER — FERROUS SULFATE 325 (65 FE) MG PO TABS
325.0000 mg | ORAL_TABLET | Freq: Two times a day (BID) | ORAL | Status: DC
  Administered 2012-04-28 – 2012-05-03 (×9): 325 mg via ORAL
  Filled 2012-04-28 (×13): qty 1

## 2012-04-28 MED ORDER — FENTANYL CITRATE 0.05 MG/ML IJ SOLN
INTRAMUSCULAR | Status: AC
  Filled 2012-04-28: qty 4

## 2012-04-28 MED ORDER — ONDANSETRON HCL 4 MG/2ML IJ SOLN: 4.0000 mg | Freq: Once | INTRAMUSCULAR | Status: DC | PRN

## 2012-04-28 MED ORDER — OXYCODONE-ACETAMINOPHEN 5-325 MG PO TABS
1.0000 | ORAL_TABLET | ORAL | Status: DC | PRN
  Administered 2012-04-29 – 2012-05-01 (×7): 1 via ORAL
  Filled 2012-04-28 (×6): qty 1

## 2012-04-28 MED ORDER — FENTANYL CITRATE 0.05 MG/ML IJ SOLN
25.0000 ug | INTRAMUSCULAR | Status: DC | PRN
  Administered 2012-04-28: 50 ug via INTRAVENOUS

## 2012-04-28 MED ORDER — SODIUM CHLORIDE 0.9 % IR SOLN
Status: DC | PRN
  Administered 2012-04-28: 3000 mL

## 2012-04-28 MED ORDER — ZOLPIDEM TARTRATE 5 MG PO TABS: 5.0000 mg | ORAL_TABLET | Freq: Every evening | ORAL | Status: DC | PRN

## 2012-04-28 MED ORDER — ASPIRIN EC 325 MG PO TBEC
325.0000 mg | DELAYED_RELEASE_TABLET | Freq: Every day | ORAL | Status: DC
  Administered 2012-04-29 – 2012-05-03 (×4): 325 mg via ORAL
  Filled 2012-04-28 (×6): qty 1

## 2012-04-28 SURGICAL SUPPLY — 32 items
BANDAGE GAUZE ELAST BULKY 4 IN (GAUZE/BANDAGES/DRESSINGS) ×1 IMPLANT
CLOTH BEACON ORANGE TIMEOUT ST (SAFETY) ×2 IMPLANT
COVER SURGICAL LIGHT HANDLE (MISCELLANEOUS) ×2 IMPLANT
DRAPE ORTHO SPLIT 77X108 STRL (DRAPES) ×4
DRAPE SURG ORHT 6 SPLT 77X108 (DRAPES) ×2 IMPLANT
DRAPE U-SHAPE 47X51 STRL (DRAPES) ×2 IMPLANT
DRSG ADAPTIC 3X8 NADH LF (GAUZE/BANDAGES/DRESSINGS) ×2 IMPLANT
DRSG PAD ABDOMINAL 8X10 ST (GAUZE/BANDAGES/DRESSINGS) ×2 IMPLANT
DURAPREP 26ML APPLICATOR (WOUND CARE) ×2 IMPLANT
ELECT CAUTERY BLADE 6.4 (BLADE) IMPLANT
ELECT REM PT RETURN 9FT ADLT (ELECTROSURGICAL)
ELECTRODE REM PT RTRN 9FT ADLT (ELECTROSURGICAL) IMPLANT
GLOVE ECLIPSE 9.0 STRL (GLOVE) ×2 IMPLANT
GLOVE SS PI 9.0 STRL (GLOVE) ×2 IMPLANT
GOWN PREVENTION PLUS XLARGE (GOWN DISPOSABLE) ×2 IMPLANT
GOWN STRL NON-REIN LRG LVL3 (GOWN DISPOSABLE) ×2 IMPLANT
HANDPIECE INTERPULSE COAX TIP (DISPOSABLE)
KIT BASIN OR (CUSTOM PROCEDURE TRAY) ×2 IMPLANT
KIT ROOM TURNOVER OR (KITS) ×2 IMPLANT
MANIFOLD NEPTUNE II (INSTRUMENTS) ×2 IMPLANT
NS IRRIG 1000ML POUR BTL (IV SOLUTION) ×2 IMPLANT
PACK TOTAL JOINT (CUSTOM PROCEDURE TRAY) ×2 IMPLANT
PAD ARMBOARD 7.5X6 YLW CONV (MISCELLANEOUS) ×4 IMPLANT
SET HNDPC FAN SPRY TIP SCT (DISPOSABLE) IMPLANT
SPONGE GAUZE 4X4 12PLY (GAUZE/BANDAGES/DRESSINGS) ×2 IMPLANT
SPONGE LAP 18X18 X RAY DECT (DISPOSABLE) ×2 IMPLANT
TAPE CLOTH SURG 6X10 WHT LF (GAUZE/BANDAGES/DRESSINGS) ×1 IMPLANT
TOWEL OR 17X24 6PK STRL BLUE (TOWEL DISPOSABLE) ×2 IMPLANT
TOWEL OR 17X26 10 PK STRL BLUE (TOWEL DISPOSABLE) ×2 IMPLANT
TUBE ANAEROBIC SPECIMEN COL (MISCELLANEOUS) IMPLANT
UNDERPAD 30X30 INCONTINENT (UNDERPADS AND DIAPERS) ×2 IMPLANT
WATER STERILE IRR 1000ML POUR (IV SOLUTION) ×2 IMPLANT

## 2012-04-28 NOTE — Brief Op Note (Signed)
04/28/2012  10:11 AM  PATIENT:  Chris [name]  72 y.o. male  PRE-OPERATIVE DIAGNOSIS:  open wound right hip  POST-OPERATIVE DIAGNOSIS:  open wound right hip  PROCEDURE:  Procedure(s): IRRIGATION AND DEBRIDEMENT WOUND RIGHT HIP POSSIBLE CLOSURE (Right)  SURGEON:  Surgeon(s) and Role:    * Kennieth Rad, MD - Primary  PHYSICIAN ASSISTANT:   ASSISTANTS: none   ANESTHESIA:   general  EBL:     BLOOD ADMINISTERED:none  DRAINS: none   LOCAL MEDICATIONS USED:  NONE  SPECIMEN:  No Specimen  DISPOSITION OF SPECIMEN:  N/A  COUNTS:  YES  TOURNIQUET:  * No tourniquets in log *  DICTATION: .Other Dictation: Dictation Number REPORT [id number]  PLAN OF CARE: Admit to inpatient   PATIENT DISPOSITION:  PACU - hemodynamically stable.   Delay start of Pharmacological VTE agent (>24hrs) due to surgical blood loss or risk of bleeding: yes

## 2012-04-28 NOTE — Transfer of Care (Signed)
Immediate Anesthesia Transfer of Care Note  Patient: Chris [name]  Procedure(s) Performed: Procedure(s): IRRIGATION AND DEBRIDEMENT WOUND RIGHT HIP POSSIBLE CLOSURE (Right)  Patient Location: PACU  Anesthesia Type:General  Level of Consciousness: awake, alert , oriented and patient cooperative  Airway & Oxygen Therapy: Patient Spontanous Breathing and Patient connected to nasal cannula oxygen  Post-op Assessment: Report given to PACU RN, Post -op Vital signs reviewed and stable and Patient moving all extremities X 4  Post vital signs: Reviewed and stable  Complications: No apparent anesthesia complications

## 2012-04-28 NOTE — Progress Notes (Signed)
ANTIBIOTIC CONSULT NOTE - INITIAL  Pharmacy Consult for vancomycin Indication: possible mrsa infection   Allergies  Allergen Reactions  . Penicillins Nausea And Vomiting    Patient Measurements: Wt= 76.1 kg  Vital Signs: Temp: 97.5 F (36.4 C) (04/24 1215) Temp src: Oral (04/24 0715) BP: 120/45 mmHg (04/24 1308) Pulse Rate: 80 (04/24 1300) Intake/Output from previous day:   Intake/Output from this shift: Total I/O In: 900 [I.V.:900] Out: -   Labs: No results found for this basename: WBC, HGB, PLT, LABCREA, CREATININE,  in the last 72 hours The CrCl is unknown because both a height and weight (above a minimum accepted value) are required for this calculation. No results found for this basename: VANCOTROUGH, VANCOPEAK, VANCORANDOM, GENTTROUGH, GENTPEAK, GENTRANDOM, TOBRATROUGH, TOBRAPEAK, TOBRARND, AMIKACINPEAK, AMIKACINTROU, AMIKACIN,  in the last 72 hours   Medical History: Past Medical History  Diagnosis Date  . Poor dentition   . COPD (chronic obstructive pulmonary disease)   . CHF (congestive heart failure)   . History of blood transfusion 08/19/2011    "first time"  . Prostate cancer 2011    seed implant  . Atrial flutter     h/o; successful cardioversion,TEE guided  . Iron deficiency anemia     HX BLOOD TX  . Aortic valve regurgitation     moderate AR by echo 2010  . Mitral valve regurgitation     moderate to severe by 2D, mod by TEE '10  . Dementia     Medications:  Prescriptions prior to admission  Medication Sig Dispense Refill  . aspirin EC 325 MG EC tablet Take 1 tablet (325 mg total) by mouth daily with breakfast.  30 tablet  3  . ferrous sulfate 325 (65 FE) MG tablet Take 325 mg by mouth 2 (two) times daily.      . furosemide (LASIX) 20 MG tablet Take 20 mg by mouth daily.      . haloperidol (HALDOL) 0.5 MG tablet Take 0.25 mg by mouth every 6 (six) hours as needed.      . meloxicam (MOBIC) 15 MG tablet Take 15 mg by mouth daily.          Assessment: 72 yo male with a non-healing wound on the right hip s/p debridement and wound closure. SCr on 4/17 was 0.75 with CrCl ~ 49ml/min. Wound cultures have been ordered.  Vancomycin 1000mg  IV given at 8:45am today  Goal of Therapy:  Vancomycin trough level 10-15 mcg/ml  Plan:  -Continue vancomycin as 1000mg  IV q12h, next dose at 9pm -Will follow renal function, LOT and patient progress  Harland German, Pharm D 04/28/2012 1:29 PM

## 2012-04-28 NOTE — Anesthesia Postprocedure Evaluation (Signed)
Anesthesia Post Note  Patient: Chris [name]  Procedure(s) Performed: Procedure(s) (LRB): IRRIGATION AND DEBRIDEMENT WOUND RIGHT HIP POSSIBLE CLOSURE (Right)  Anesthesia type: General  Patient location: PACU  Post pain: Pain level controlled and Adequate analgesia  Post assessment: Post-op Vital signs reviewed, Patient's Cardiovascular Status Stable, Respiratory Function Stable, Patent Airway and Pain level controlled  Last Vitals:  Filed Vitals:   04/28/12 1130  BP:   Pulse: 82  Temp:   Resp: 14    Post vital signs: Reviewed and stable  Level of consciousness: awake, alert  and oriented  Complications: No apparent anesthesia complications

## 2012-04-28 NOTE — Preoperative (Signed)
Beta Blockers   Reason not to administer Beta Blockers:Not Applicable 

## 2012-04-28 NOTE — OR Nursing (Signed)
Pt's right hip wound packed with two Kerlix dressings soaked with bug juice.

## 2012-04-28 NOTE — H&P (Signed)
Chris [name] is an 72 y.o. male.   Chief Complaint: OPEN WOUND RIGHT HIP HPI: THIS IS A 72 Y/O MALE TREATED FOR A NON-HEALING WOUND OVER THE RIGHT HIP AND IS SCHEDULED FOR DEBRIDEMENT AND WOUND CLOSURE.  Past Medical History  Diagnosis Date  . Poor dentition   . COPD (chronic obstructive pulmonary disease)   . CHF (congestive heart failure)   . History of blood transfusion 08/19/2011    "first time"  . Prostate cancer 2011    seed implant  . Atrial flutter     h/o; successful cardioversion,TEE guided  . Iron deficiency anemia     HX BLOOD TX  . Aortic valve regurgitation     moderate AR by echo 2010  . Mitral valve regurgitation     moderate to severe by 2D, mod by TEE '10  . Dementia     Past Surgical History  Procedure Laterality Date  . 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 Esophagogastroduodenoscopy  08/21/2011    Procedure: ESOPHAGOGASTRODUODENOSCOPY (EGD);  Surgeon: Shirley Friar, MD;  Location: [facility] ENDOSCOPY;  Service: Endoscopy;  Laterality: N/A;  . Colonoscopy  08/21/2011    Procedure: COLONOSCOPY;  Surgeon: Shirley Friar, MD;  Location: [facility] ENDOSCOPY;  Service: Endoscopy;  Laterality: N/A;  . Joint replacement    . Irrigation and debridement knee  12/23/2011  . I&d extremity  12/24/2011    Procedure: IRRIGATION AND DEBRIDEMENT EXTREMITY;  Surgeon: Kennieth Rad, MD;  Location: [facility] OR;  Service: Orthopedics;  Laterality: Right;  POSSIBLE WOUND CLOSURE  . Incision and drainage of wound  12/26/2011    Procedure: IRRIGATION AND DEBRIDEMENT WOUND;  Surgeon: Kennieth Rad, MD;  Location: [facility] OR;  Service: Orthopedics;  Laterality: Right;  I&D of Wound Right Hip and Wound Closure    History reviewed. No pertinent family history. Social History:  reports that he has been smoking Cigarettes.  He has a 57 pack-year smoking history. He has never used smokeless tobacco. He reports that  drinks alcohol. He reports that he does not use  illicit drugs.  Allergies:  Allergies  Allergen Reactions  . Penicillins Nausea And Vomiting    Medications Prior to Admission  Medication Sig Dispense Refill  . aspirin EC 325 MG EC tablet Take 1 tablet (325 mg total) by mouth daily with breakfast.  30 tablet  3  . ferrous sulfate 325 (65 FE) MG tablet Take 325 mg by mouth 2 (two) times daily.      . furosemide (LASIX) 20 MG tablet Take 20 mg by mouth daily.      . haloperidol (HALDOL) 0.5 MG tablet Take 0.25 mg by mouth every 6 (six) hours as needed.      . meloxicam (MOBIC) 15 MG tablet Take 15 mg by mouth daily.        No results found for this or any previous visit (from the past 48 hour(s)). No results found.  Review of Systems  Constitutional: Negative.   HENT: Negative.   Eyes: Negative.   Respiratory: Negative.   Cardiovascular: Negative.   Gastrointestinal: Negative.   Genitourinary: Negative.   Musculoskeletal: Positive for joint pain.  Skin: Negative.   Neurological: Negative.   Endo/Heme/Allergies: Negative.   Psychiatric/Behavioral: Negative.     Blood pressure 131/70, pulse 65, temperature 97.3 F (36.3 C), temperature source Oral, resp. rate 18, SpO2 97.00%. Physical Exam RIGHT HIP WITH ONE [facility] OPEN WOUND OVER THE  TROCHANTER WITH MINIMAL CLEAR DRAINAGE.   Assessment/Plan OPEN WOUND RIGHT HIP/ PLAN DEBRIDEMENT AND WOUND CLOSURE Kennieth Rad 04/28/2012, 8:49 AM

## 2012-04-28 NOTE — Progress Notes (Signed)
Patient confused today consent resigned with sister/ POA. Patient with hx of dementia she reports same is normal for patient.

## 2012-04-28 NOTE — Anesthesia Preprocedure Evaluation (Addendum)
Anesthesia Evaluation  Patient identified by MRN, date of birth, ID band Patient awake    Reviewed: Allergy & Precautions, H&P , NPO status , Patient's Chart, lab work & pertinent test results  Airway Mallampati: II TM Distance: >3 FB Neck ROM: full    Dental  (+) Dental Advisory Given   Pulmonary COPDCurrent Smoker,          Cardiovascular +CHF + dysrhythmias Atrial Fibrillation + Valvular Problems/Murmurs MR and AI     Neuro/Psych Dementiadementia    GI/Hepatic   Endo/Other    Renal/GU      Musculoskeletal   Abdominal   Peds  Hematology   Anesthesia Other Findings   Reproductive/Obstetrics                          Anesthesia Physical Anesthesia Plan  ASA: III  Anesthesia Plan: General   Post-op Pain Management:    Induction: Intravenous  Airway Management Planned: LMA  Additional Equipment:   Intra-op Plan:   Post-operative Plan:   Informed Consent: I have reviewed the patients History and Physical, chart, labs and discussed the procedure including the risks, benefits and alternatives for the proposed anesthesia with the patient or authorized representative who has indicated his/her understanding and acceptance.     Plan Discussed with: CRNA, Anesthesiologist and Surgeon  Anesthesia Plan Comments:         Anesthesia Quick Evaluation

## 2012-04-28 NOTE — Anesthesia Procedure Notes (Signed)
Procedure Name: LMA Insertion Date/Time: 04/28/2012 9:24 AM Performed by: Rogelia Boga Pre-anesthesia Checklist: Patient identified, Emergency Drugs available, Suction available, Patient being monitored and Timeout performed Patient Re-evaluated:Patient Re-evaluated prior to inductionOxygen Delivery Method: Circle system utilized Preoxygenation: Pre-oxygenation with 100% oxygen Intubation Type: IV induction LMA: LMA with gastric port inserted LMA Size: 5.0 Number of attempts: 1 Placement Confirmation: positive ETCO2 and breath sounds checked- equal and bilateral Tube secured with: Tape Dental Injury: Teeth and Oropharynx as per pre-operative assessment  Comments: Leak Test < 20 CM H2O

## 2012-04-28 NOTE — Op Note (Signed)
Chris [name], Chris [name] NO.:  [id number]  MEDICAL RECORD NO.:  [id number]  LOCATION:  [id number]                        FACILITY:  [facility]  PHYSICIAN:  Myrtie Neither, MD      DATE OF BIRTH:  [birth year]  DATE OF PROCEDURE:  04/28/2012 DATE OF DISCHARGE:                              OPERATIVE REPORT   PREOPERATIVE DIAGNOSIS:  Granuloma, open wound of right hip.  POSTOPERATIVE DIAGNOSIS:  Granuloma, open wound right hip with abscess.  ANESTHESIA:  General.  PROCEDURE:  Excision of debridement and packing, right hip wound.  PROCEDURE IN DETAIL:  The patient was taken to the operating room. After given adequate preop medications, given general anesthesia and intubated.  Right hip was prepped and draped with Betadine scrub, and painted with Betadine solution, draped in sterile manner.  Granuloma was approximately 1 inch in length, this was completely excised.  The wound was extended both proximally and distally.  Following the synovial lining demonstrates the abscess formation right posteriorly.  With use of curette and scalpel, excision and debridement was done.  After adequate debridement, wound was packed with antibiotic-soaked Kerlix x2. Antibiotic irrigation was done with Simpulse.  Cultures, aerobic and anaerobic Gram stain were done prior to debridement.  The patient tolerated the procedure quite well in the recovery room in stable and satisfactory condition.  The patient will be admitted for IV therapy.     Myrtie Neither, MD     AC/MEDQ  D:  04/28/2012  T:  04/28/2012  Job:  [id number]

## 2012-04-29 ENCOUNTER — Inpatient Hospital Stay (HOSPITAL_COMMUNITY): Payer: Medicare Other

## 2012-04-29 ENCOUNTER — Encounter (HOSPITAL_COMMUNITY): Payer: Self-pay | Admitting: Orthopedic Surgery

## 2012-04-29 ENCOUNTER — Other Ambulatory Visit: Payer: Self-pay | Admitting: Orthopedic Surgery

## 2012-04-29 LAB — CBC
MCH: 25.9 pg — ABNORMAL LOW (ref 26.0–34.0)
MCHC: 32.8 g/dL (ref 30.0–36.0)
MCV: 79 fL (ref 78.0–100.0)
Platelets: 291 10*3/uL (ref 150–400)
RDW: 16.7 % — ABNORMAL HIGH (ref 11.5–15.5)
WBC: 6.9 10*3/uL (ref 4.0–10.5)

## 2012-04-29 LAB — CREATININE, SERUM: GFR calc Af Amer: >90 mL/min (ref >90)

## 2012-04-29 MED ORDER — CHLORHEXIDINE GLUCONATE 4 % EX LIQD
60.0000 mL | Freq: Once | CUTANEOUS | Status: AC
  Administered 2012-04-30: 4 via TOPICAL
  Filled 2012-04-29: qty 60

## 2012-04-29 MED ORDER — IOHEXOL 300 MG/ML  SOLN
100.0000 mL | Freq: Once | INTRAMUSCULAR | Status: AC | PRN
  Administered 2012-04-29: 100 mL via INTRAVENOUS

## 2012-04-29 MED ORDER — ENOXAPARIN SODIUM 40 MG/0.4ML ~~LOC~~ SOLN
40.0000 mg | SUBCUTANEOUS | Status: DC
  Administered 2012-04-29 – 2012-05-02 (×4): 40 mg via SUBCUTANEOUS
  Filled 2012-04-29 (×5): qty 0.4

## 2012-04-29 NOTE — Progress Notes (Signed)
Subjective: 1 Day Post-Op Procedure(s) (LRB): IRRIGATION AND DEBRIDEMENT WOUND RIGHT HIP POSSIBLE CLOSURE (Right) Patient reports pain as 5 on 0-10 scale.    Objective: Vital signs in last 24 hours: Temp:  [97.7 F (36.5 C)-99.5 F (37.5 C)] 97.8 F (36.6 C) (04/25 1435) Pulse Rate:  [100-106] 106 (04/25 1435) Resp:  [16-18] 18 (04/25 1435) BP: (114-139)/(55-62) 114/55 mmHg (04/25 1435) SpO2:  [95 %-96 %] 96 % (04/25 1435)  Intake/Output from previous day: 04/24 0701 - 04/25 0700 In: 900 [I.V.:900] Out: 300 [Urine:300] Intake/Output this shift: Total I/O In: 240 [P.O.:240] Out: 650 [Urine:650]  No results found for this basename: HGB,  in the last 72 hours No results found for this basename: WBC, RBC, HCT, PLT,  in the last 72 hours No results found for this basename: NA, K, CL, CO2, BUN, CREATININE, GLUCOSE, CALCIUM,  in the last 72 hours No results found for this basename: LABPT, INR,  in the last 72 hours  Neurovascular intact Dorsiflexion/Plantar flexion intact  Assessment/Plan: 1 Day Post-Op Procedure(s) (LRB): IRRIGATION AND DEBRIDEMENT WOUND RIGHT HIP POSSIBLE CLOSURE (Right) Advance diet Continue ABX therapy due to Post-op infection  Shone Leventhal F 04/29/2012, 5:05 PM

## 2012-04-29 NOTE — Progress Notes (Signed)
Utilization review complete. Aliciana Ricciardi RN CCM Case Mgmt phone [phone] 

## 2012-04-30 ENCOUNTER — Encounter (HOSPITAL_COMMUNITY): Payer: Self-pay | Admitting: Certified Registered"

## 2012-04-30 ENCOUNTER — Ambulatory Visit: Admit: 2012-04-30 | Payer: Self-pay | Admitting: Orthopedic Surgery

## 2012-04-30 ENCOUNTER — Encounter (HOSPITAL_COMMUNITY)
Admission: RE | Disposition: A | Discharge status: Home Health | Payer: Self-pay | Source: Ambulatory Visit | Attending: Orthopedic Surgery

## 2012-04-30 ENCOUNTER — Inpatient Hospital Stay (HOSPITAL_COMMUNITY): Payer: Medicare Other | Admitting: Certified Registered"

## 2012-04-30 HISTORY — PX: INCISION AND DRAINAGE HIP: SHX1801

## 2012-04-30 LAB — BASIC METABOLIC PANEL
Calcium: 8.5 mg/dL (ref 8.4–10.5)
Creatinine, Ser: 0.89 mg/dL (ref 0.50–1.35)
GFR calc Af Amer: >90 mL/min (ref >90)
Sodium: 134 mEq/L — ABNORMAL LOW (ref 135–145)

## 2012-04-30 LAB — CBC
Platelets: 267 10*3/uL (ref 150–400)
RBC: 3.15 MIL/uL — ABNORMAL LOW (ref 4.22–5.81)
RDW: 16.4 % — ABNORMAL HIGH (ref 11.5–15.5)
WBC: 7.3 10*3/uL (ref 4.0–10.5)

## 2012-04-30 LAB — CULTURE, ROUTINE-ABSCESS

## 2012-04-30 SURGERY — IRRIGATION AND DEBRIDEMENT HIP
Anesthesia: General | Site: Hip | Laterality: Right | Wound class: Dirty or Infected

## 2012-04-30 MED ORDER — SODIUM CHLORIDE 0.9 % IR SOLN
Status: DC | PRN
  Administered 2012-04-30: 09:00:00

## 2012-04-30 MED ORDER — 0.9 % SODIUM CHLORIDE (POUR BTL) OPTIME
TOPICAL | Status: DC | PRN
  Administered 2012-04-30: 1000 mL

## 2012-04-30 MED ORDER — SODIUM CHLORIDE 0.9 % IR SOLN
Status: DC | PRN
  Administered 2012-04-30: 3000 mL

## 2012-04-30 MED ORDER — ACETAMINOPHEN 10 MG/ML IV SOLN
1000.0000 mg | Freq: Once | INTRAVENOUS | Status: AC | PRN
  Filled 2012-04-30: qty 100

## 2012-04-30 MED ORDER — FENTANYL CITRATE 0.05 MG/ML IJ SOLN
INTRAMUSCULAR | Status: DC | PRN
  Administered 2012-04-30 (×5): 50 ug via INTRAVENOUS

## 2012-04-30 MED ORDER — LIDOCAINE HCL (CARDIAC) 20 MG/ML IV SOLN
INTRAVENOUS | Status: DC | PRN
  Administered 2012-04-30: 30 mg via INTRAVENOUS

## 2012-04-30 MED ORDER — ONDANSETRON HCL 4 MG/2ML IJ SOLN
INTRAMUSCULAR | Status: DC | PRN
  Administered 2012-04-30: 4 mg via INTRAVENOUS

## 2012-04-30 MED ORDER — LACTATED RINGERS IV SOLN
INTRAVENOUS | Status: DC | PRN
  Administered 2012-04-30: 08:00:00 via INTRAVENOUS

## 2012-04-30 MED ORDER — SODIUM CHLORIDE 0.9 % IR SOLN: Status: DC | PRN

## 2012-04-30 MED ORDER — PROPOFOL 10 MG/ML IV BOLUS
INTRAVENOUS | Status: DC | PRN
  Administered 2012-04-30: 50 mg via INTRAVENOUS
  Administered 2012-04-30: 40 mg via INTRAVENOUS
  Administered 2012-04-30: 120 mg via INTRAVENOUS

## 2012-04-30 MED ORDER — HYDROMORPHONE HCL PF 1 MG/ML IJ SOLN: 0.2500 mg | INTRAMUSCULAR | Status: DC | PRN

## 2012-04-30 MED ORDER — SUCCINYLCHOLINE CHLORIDE 20 MG/ML IJ SOLN
INTRAMUSCULAR | Status: DC | PRN
  Administered 2012-04-30: 100 mg via INTRAVENOUS

## 2012-04-30 MED ORDER — ONDANSETRON HCL 4 MG/2ML IJ SOLN: 4.0000 mg | Freq: Once | INTRAMUSCULAR | Status: AC | PRN

## 2012-04-30 SURGICAL SUPPLY — 37 items
CLOTH BEACON ORANGE TIMEOUT ST (SAFETY) ×2 IMPLANT
COVER SURGICAL LIGHT HANDLE (MISCELLANEOUS) ×2 IMPLANT
DRAPE ORTHO SPLIT 77X108 STRL (DRAPES) ×4
DRAPE SURG ORHT 6 SPLT 77X108 (DRAPES) ×2 IMPLANT
DRAPE U-SHAPE 47X51 STRL (DRAPES) ×1 IMPLANT
DRSG ADAPTIC 3X8 NADH LF (GAUZE/BANDAGES/DRESSINGS) ×2 IMPLANT
DRSG PAD ABDOMINAL 8X10 ST (GAUZE/BANDAGES/DRESSINGS) ×3 IMPLANT
DURAPREP 26ML APPLICATOR (WOUND CARE) ×1 IMPLANT
ELECT CAUTERY BLADE 6.4 (BLADE) ×1 IMPLANT
ELECT REM PT RETURN 9FT ADLT (ELECTROSURGICAL) ×2
ELECTRODE REM PT RTRN 9FT ADLT (ELECTROSURGICAL) IMPLANT
EVACUATOR 3/16  PVC DRAIN (DRAIN) ×1
EVACUATOR 3/16 PVC DRAIN (DRAIN) IMPLANT
GLOVE ECLIPSE 9.0 STRL (GLOVE) ×2 IMPLANT
GLOVE SS PI 9.0 STRL (GLOVE) ×2 IMPLANT
GOWN PREVENTION PLUS XLARGE (GOWN DISPOSABLE) ×2 IMPLANT
GOWN STRL NON-REIN LRG LVL3 (GOWN DISPOSABLE) ×2 IMPLANT
HANDPIECE INTERPULSE COAX TIP (DISPOSABLE) ×4
KIT BASIN OR (CUSTOM PROCEDURE TRAY) ×2 IMPLANT
KIT ROOM TURNOVER OR (KITS) ×2 IMPLANT
MANIFOLD NEPTUNE II (INSTRUMENTS) ×2 IMPLANT
NS IRRIG 1000ML POUR BTL (IV SOLUTION) ×2 IMPLANT
PACK TOTAL JOINT (CUSTOM PROCEDURE TRAY) ×2 IMPLANT
PAD ARMBOARD 7.5X6 YLW CONV (MISCELLANEOUS) ×4 IMPLANT
SET HNDPC FAN SPRY TIP SCT (DISPOSABLE) IMPLANT
SOLUTION BETADINE 4OZ (MISCELLANEOUS) ×1 IMPLANT
SPONGE GAUZE 4X4 12PLY (GAUZE/BANDAGES/DRESSINGS) ×2 IMPLANT
SPONGE LAP 18X18 X RAY DECT (DISPOSABLE) ×3 IMPLANT
SUT VIC AB 0 CT1 27 (SUTURE) ×6
SUT VIC AB 0 CT1 27XBRD ANBCTR (SUTURE) IMPLANT
SUT VIC AB 2-0 CT1 36 (SUTURE) ×3 IMPLANT
TAPE CLOTH SURG 6X10 WHT LF (GAUZE/BANDAGES/DRESSINGS) ×1 IMPLANT
TOWEL OR 17X24 6PK STRL BLUE (TOWEL DISPOSABLE) ×2 IMPLANT
TOWEL OR 17X26 10 PK STRL BLUE (TOWEL DISPOSABLE) ×2 IMPLANT
TUBE ANAEROBIC SPECIMEN COL (MISCELLANEOUS) IMPLANT
UNDERPAD 30X30 INCONTINENT (UNDERPADS AND DIAPERS) ×1 IMPLANT
WATER STERILE IRR 1000ML POUR (IV SOLUTION) ×1 IMPLANT

## 2012-04-30 NOTE — Progress Notes (Signed)
Patient ID: Chris [name], male   DOB: 04-20-1940, 72 y.o.   MRN: [id number] Patient is scheduled for i&d and possible wound closure this am . No new changes ,patient is stable.

## 2012-04-30 NOTE — Preoperative (Signed)
Beta Blockers   Reason not to administer Beta Blockers:Not Applicable 

## 2012-04-30 NOTE — Progress Notes (Signed)
ANTIBIOTIC CONSULT NOTE - FOLLOW UP  Pharmacy Consult for vancomycin Indication: hip wound s/p I&D  Allergies  Allergen Reactions  . Penicillins Nausea And Vomiting    Patient Measurements:   Adjusted Body Weight:   Vital Signs: Temp: 98.4 F (36.9 C) (04/26 1211) BP: 108/54 mmHg (04/26 1211) Pulse Rate: 99 (04/26 1211) Intake/Output from previous day: 04/25 0701 - 04/26 0700 In: 240 [P.O.:240] Out: 1350 [Urine:1350] Intake/Output from this shift:    Labs:  Recent Labs  04/29/12 1717 04/30/12 0900  WBC 6.9 7.3  HGB 8.9* 8.1*  PLT 291 267  CREATININE 0.92 0.89   The CrCl is unknown because both a height and weight (above a minimum accepted value) are required for this calculation.  Recent Labs  04/30/12 1945  VANCOTROUGH 13.3     Microbiology: Recent Results (from the past 720 hour(s))  SURGICAL PCR SCREEN     Status: None   Collection Time    04/21/12 11:20 AM      Result Value Range Status   MRSA, PCR NEGATIVE  NEGATIVE Final   Staphylococcus aureus NEGATIVE  NEGATIVE Final   Comment:            The Xpert SA Assay (FDA     approved for NASAL specimens     in patients over 53 years of age),     is one component of     a comprehensive surveillance     program.  Test performance has     been validated by [employer] for patients greater     than or equal to 64 year old.     It is not intended     to diagnose infection nor to     guide or monitor treatment.  CULTURE, ROUTINE-ABSCESS     Status: None   Collection Time    04/28/12 10:03 AM      Result Value Range Status   Specimen Description ABSCESS HIP RIGHT   Final   Special Requests PT ON VANCOMYCIN   Final   Gram Stain     Final   Value: FEW WBC PRESENT, PREDOMINANTLY PMN     NO SQUAMOUS EPITHELIAL CELLS SEEN     NO ORGANISMS SEEN   Culture     Final   Value: FEW GROUP B STREP(S.AGALACTIAE)ISOLATED     Note: TESTING AGAINST S. AGALACTIAE NOT ROUTINELY PERFORMED DUE TO PREDICTABILITY OF  AMP/PEN/VAN SUSCEPTIBILITY.   Report Status 04/30/2012 FINAL   Final  ANAEROBIC CULTURE     Status: None   Collection Time    04/28/12 10:03 AM      Result Value Range Status   Specimen Description ABSCESS HIP RIGHT   Final   Special Requests PT ON VANCOMYCIN   Final   Gram Stain PENDING   Incomplete   Culture     Final   Value: NO ANAEROBES ISOLATED; CULTURE IN PROGRESS FOR 5 DAYS   Report Status PENDING   Incomplete    Anti-infectives   Start     Dose/Rate Route Frequency Ordered Stop   04/30/12 0921  polymyxin B 500,000 Units, bacitracin 50,000 Units in sodium chloride irrigation 0.9 % 500 mL irrigation  Status:  Discontinued       As needed 04/30/12 0921 04/30/12 1015   04/28/12 2100  vancomycin (VANCOCIN) IVPB 1000 mg/200 mL premix     1,000 mg 200 mL/hr over 60 Minutes Intravenous Every 12 hours 04/28/12 1331  04/28/12 1000  polymyxin B 500,000 Units, bacitracin 50,000 Units in sodium chloride irrigation 0.9 % 500 mL irrigation  Status:  Discontinued       As needed 04/28/12 1001 04/28/12 1018   04/28/12 0600  vancomycin (VANCOCIN) IVPB 1000 mg/200 mL premix     1,000 mg 200 mL/hr over 60 Minutes Intravenous On call to O.R. 04/27/12 1520 04/28/12 0845      Assessment: 72 yo male with hip wound s/p I&D is currently on therapeutic vancomycin.  Vancomycin trough was 13.3.  Goal of Therapy:  Vancomycin trough level 10-15 mcg/ml  Plan:  1) Continue vancomycin 1g iv q12h 2) Follow plan on antibiotic  Gerrett Loman, Tsz-Yin 04/30/2012,8:59 PM

## 2012-04-30 NOTE — Anesthesia Postprocedure Evaluation (Signed)
  Anesthesia Post-op Note  Patient: Chris [name]  Procedure(s) Performed: Procedure(s): IRRIGATION AND DEBRIDEMENT HIP (Right)  Patient Location: PACU  Anesthesia Type:General  Level of Consciousness: awake and patient cooperative  Airway and Oxygen Therapy: Patient Spontanous Breathing and Patient connected to nasal cannula oxygen  Post-op Pain: none  Post-op Assessment: Post-op Vital signs reviewed, Patient's Cardiovascular Status Stable, Patent Airway and Pain level controlled  Post-op Vital Signs: stable  Complications: No apparent anesthesia complications

## 2012-04-30 NOTE — Progress Notes (Signed)
Patient refused labs drawn this am.

## 2012-04-30 NOTE — Anesthesia Preprocedure Evaluation (Addendum)
Anesthesia Evaluation  Patient identified by MRN, date of birth, ID band Patient confused    Reviewed: Allergy & Precautions, H&P , NPO status , Patient's Chart, lab work & pertinent test results  Airway Mallampati: II      Dental  (+) Poor Dentition   Pulmonary  breath sounds clear to auscultation        Cardiovascular Rhythm:Regular Rate:Normal     Neuro/Psych    GI/Hepatic   Endo/Other    Renal/GU      Musculoskeletal   Abdominal   Peds  Hematology   Anesthesia Other Findings   Reproductive/Obstetrics                           Anesthesia Physical Anesthesia Plan  ASA: III  Anesthesia Plan: General   Post-op Pain Management:    Induction: Intravenous  Airway Management Planned: LMA  Additional Equipment:   Intra-op Plan:   Post-operative Plan:   Informed Consent: I have reviewed the patients History and Physical, chart, labs and discussed the procedure including the risks, benefits and alternatives for the proposed anesthesia with the patient or authorized representative who has indicated his/her understanding and acceptance.   Dental advisory given  Plan Discussed with: CRNA, Anesthesiologist and Surgeon  Anesthesia Plan Comments: (Wound R. Hip S/P I and D Dementia H/O atrial flutter S/P cardioversion 12/04/08 EF normal by echo H/O prostate Ca S/P seed implantation  Plan GA with oral ETT  Kipp Brood, MD)       Anesthesia Quick Evaluation

## 2012-04-30 NOTE — Anesthesia Procedure Notes (Signed)
Procedure Name: Intubation Date/Time: 04/30/2012 8:48 AM Performed by: Jerilee Hoh Pre-anesthesia Checklist: Patient identified, Emergency Drugs available, Patient being monitored and Suction available Patient Re-evaluated:Patient Re-evaluated prior to inductionOxygen Delivery Method: Circle system utilized Preoxygenation: Pre-oxygenation with 100% oxygen Intubation Type: IV induction Ventilation: Mask ventilation without difficulty Laryngoscope Size: Mac and 4 Grade View: Grade I Tube type: Oral Tube size: 7.5 mm Number of attempts: 1 Airway Equipment and Method: Stylet Placement Confirmation: ETT inserted through vocal cords under direct vision,  positive ETCO2 and breath sounds checked- equal and bilateral Secured at: 22 cm Tube secured with: Tape Dental Injury: Teeth and Oropharynx as per pre-operative assessment

## 2012-04-30 NOTE — Transfer of Care (Signed)
Immediate Anesthesia Transfer of Care Note  Patient: Chris [name]  Procedure(s) Performed: Procedure(s): IRRIGATION AND DEBRIDEMENT HIP (Right)  Patient Location: PACU  Anesthesia Type:General  Level of Consciousness: awake, alert  and patient cooperative  Airway & Oxygen Therapy: Patient Spontanous Breathing and Patient connected to nasal cannula oxygen  Post-op Assessment: Report given to PACU RN, Post -op Vital signs reviewed and stable and Patient moving all extremities  Post vital signs: Reviewed and stable  Complications: No apparent anesthesia complications

## 2012-04-30 NOTE — Brief Op Note (Signed)
04/28/2012 - 04/30/2012  10:18 AM  PATIENT:  Chris [name]  72 y.o. male  PRE-OPERATIVE DIAGNOSIS:  open wound right hip  POST-OPERATIVE DIAGNOSIS:  right hip wound  PROCEDURE:  Procedure(s): IRRIGATION AND DEBRIDEMENT HIP (Right)  SURGEON:  Surgeon(s) and Role:    * Kennieth Rad, MD - Primary  PHYSICIAN ASSISTANT:   ASSISTANTS: none   ANESTHESIA:   general  EBL:  Total I/O In: -  Out: 250 [Urine:200; Blood:50]  BLOOD ADMINISTERED:none  DRAINS: (RIGHT HIP) Hemovact drain(s) in the RIGHT HI[P with  Suction Open   LOCAL MEDICATIONS USED:  NONE  SPECIMEN:  No Specimen  DISPOSITION OF SPECIMEN:  N/A  COUNTS:  YES  TOURNIQUET:  * No tourniquets in log *  DICTATION: .Other Dictation: Dictation Number [id number]  PLAN OF CARE: Admit to inpatient   PATIENT DISPOSITION:  PACU - hemodynamically stable.   Delay start of Pharmacological VTE agent (>24hrs) due to surgical blood loss or risk of bleeding: no

## 2012-05-01 ENCOUNTER — Other Ambulatory Visit: Payer: Self-pay | Admitting: Orthopedic Surgery

## 2012-05-01 LAB — TYPE AND SCREEN
ABO/RH(D): B POS
Antibody Screen: NEGATIVE
Unit division: 0

## 2012-05-01 LAB — PREPARE RBC (CROSSMATCH)

## 2012-05-01 NOTE — Progress Notes (Signed)
Physical Therapy Evaluation Patient Details Name: Chris [name] MRN: [id number] DOB: 08-25-1940 Today's Date: 05/01/2012 Time: 6644-0347 PT Time Calculation (min): 23 min  PT Assessment / Plan / Recommendation Clinical Impression  72 yo male admitted for I&D R hip and wound closure present to PT with decr functional mobility; Will need clearer, more reliable picture of home situation and available assist at home in order to make solid dc recommendations; At this point, pt needs relaible 24 hour assist (at least moderate assist) in order to safely dc home; If this cannot be arranged, we must consider SNF    PT Assessment  Patient needs continued PT services    Follow Up Recommendations  Supervision/Assistance - 24 hour;Home health PT (If 24 hour assist isn't available, must consider SNF)    Does the patient have the potential to tolerate intense rehabilitation      Barriers to Discharge   Need more info re; home situation    Equipment Recommendations  Other (comment) (need more info)    Recommendations for Other Services OT consult   Frequency Min 5X/week    Precautions / Restrictions Precautions Precautions: Fall Restrictions RLE Weight Bearing: Weight bearing as tolerated   Pertinent Vitals/Pain Grimace with activity/movement of RLE; Unable to rate      Mobility  Bed Mobility Bed Mobility: Supine to Sit;Sitting - Scoot to Edge of Bed Supine to Sit: 3: Mod assist;With rails Sitting - Scoot to Edge of Bed: 4: Min assist;With rail Details for Bed Mobility Assistance: Cues for technique and to initiate; inefficient movement Transfers Transfers: Sit to Stand;Stand to Sit Sit to Stand: 4: Min assist;From bed Stand to Sit: 3: Mod assist;To chair/3-in-1;With armrests Details for Transfer Assistance: required physical assist ot control descent from sand to sit; near constant cues for safety Ambulation/Gait Ambulation/Gait Assistance: 4: Min assist Ambulation Distance  (Feet): 5 Feet Assistive device: Rolling walker Ambulation/Gait Assistance Details: Near constant cues for safety; verbal and tactile cues for posture; sits impulsively Gait Pattern: Decreased step length - right;Decreased step length - left;Decreased dorsiflexion - right;Decreased dorsiflexion - left;Trunk flexed Gait velocity: slowed    Exercises     PT Diagnosis: Difficulty walking;Acute pain  PT Problem List: Decreased strength;Decreased range of motion;Decreased activity tolerance;Decreased balance;Decreased mobility;Decreased coordination;Decreased cognition;Decreased knowledge of use of DME;Decreased safety awareness;Pain PT Treatment Interventions: DME instruction;Gait training;Stair training;Functional mobility training;Therapeutic activities;Therapeutic exercise;Balance training;Patient/family education   PT Goals Acute Rehab PT Goals PT Goal Formulation: Patient unable to participate in goal setting Time For Goal Achievement: 05/15/12 Potential to Achieve Goals: Good Pt will go Supine/Side to Sit: with supervision PT Goal: Supine/Side to Sit - Progress: Goal set today Pt will go Sit to Supine/Side: with supervision PT Goal: Sit to Supine/Side - Progress: Goal set today Pt will go Sit to Stand: with supervision PT Goal: Sit to Stand - Progress: Goal set today Pt will go Stand to Sit: with supervision PT Goal: Stand to Sit - Progress: Goal set today Pt will Transfer Bed to Chair/Chair to Bed: with supervision PT Transfer Goal: Bed to Chair/Chair to Bed - Progress: Goal set today Pt will Ambulate: 51 - 150 feet;with supervision;with least restrictive assistive device PT Goal: Ambulate - Progress: Goal set today  Visit Information  Last PT Received On: 05/01/12 Assistance Needed: +1    Subjective Data  Subjective: Agreeable to amb Patient Stated Goal: feel better   Prior Functioning  Home Living Lives With: Family (Sister) Available Help at Discharge: Family;Available  PRN/intermittently (Need more  reliable info) Type of Home: House Home Access: Stairs to enter [employer] of Steps: 2 Entrance Stairs-Rails: Right;Left Home Layout: Two level Alternate Level Stairs-Number of Steps:  (Conjecture: flight of steps; at one point, pt stated he sleeps downstairs) Bathroom Shower/Tub: Teacher, adult education:  (Will need more reliable info) Additional Comments: Pt is a poor historian; Will need more relaible info from family re: home situation and Prior level of function, and level of assist available to pt Prior Function Level of Independence: Needs assistance Comments: Will need more reliable info re: amount of assistance needed Communication Communication: No difficulties;Other (comment) (pleasantly disoriented)    Cognition  Cognition Arousal/Alertness: Awake/alert Behavior During Therapy: WFL for tasks assessed/performed Overall Cognitive Status: No family/caregiver present to determine baseline cognitive functioning    Extremity/Trunk Assessment Right Upper Extremity Assessment RUE ROM/Strength/Tone: WFL for tasks assessed Left Upper Extremity Assessment LUE ROM/Strength/Tone: WFL for tasks assessed Right Lower Extremity Assessment RLE ROM/Strength/Tone: Deficits RLE ROM/Strength/Tone Deficits: Decr AROM and strength, limited by pain postop Left Lower Extremity Assessment LLE ROM/Strength/Tone: Deficits LLE ROM/Strength/Tone Deficits: Grossly decr strength, requiring physical assist for safe sit to and from stand   Balance    End of Session PT - End of Session Equipment Utilized During Treatment: Gait belt Activity Tolerance: Patient tolerated treatment well Patient left: in chair;with call bell/phone within reach Nurse Communication: Mobility status;Other (comment) (possible need for chair alarm)  GP     Van Clines [facility] [place], [place] [phone]  05/01/2012, 1:21 PM

## 2012-05-01 NOTE — Progress Notes (Signed)
Patient ID: Chris [name], male   DOB: [birth year], 72 y.o.   MRN: [id number] Patient is afebrile, drain pulled out by patient, h&h low will transfuse 2 units of packed rbc's and repeat h&h. Cultures  show b-strep susceptible to amp/pen/van. .will start pt for ambulation wbat.

## 2012-05-01 NOTE — Op Note (Signed)
NAMENAVI, ERBER NO.:  [id number]  MEDICAL RECORD NO.:  [id number]  LOCATION:  [id number]                        FACILITY:  [facility]  PHYSICIAN:  Myrtie Neither, MD      DATE OF BIRTH:  1940/03/09  DATE OF PROCEDURE:  04/30/2012 DATE OF DISCHARGE:                              OPERATIVE REPORT   PREOPERATIVE DIAGNOSIS:  Open right hip wound.  POSTOPERATIVE DIAGNOSIS:  Open right hip wound.  PROCEDURE:  Irrigation, excisional debridement, and wound closure of right hip and placement of large Hemovac drain in the right hip.  DESCRIPTION OF PROCEDURE:  The patient was taken to the operating room. After giving adequate preop medications, given general anesthesia, intubated.  The patient was placed in the right lateral position.  Right hip was prepped with Betadine scrub and painted.  Right hip was then draped in sterile fashion.  Excisional debridement was done with a scalpel about the old exit wound of the abscess.  Wound extended all way down deep down between the subcutaneous and the gluteus muscle down to the ischium.  Copious and abundant irrigation with antibiotic solution as well as saline was done.  After adequate debridement, wound appeared to be good and healthy, good healthy bleeding tissue.  Wound closure was done from deep to the proximal aspect of the wound with use of 0-Vicryl for the fascia, 2-0 for the subcutaneous, and skin staples for the skin. A large Hemovac drain was placed into the wound and takedown to the skin.  Hemostasis was obtained with use of Bovie.  The patient tolerated the procedure quite well, went to recovery room in stable and satisfactory condition.     Myrtie Neither, MD     AC/MEDQ  D:  04/30/2012  T:  05/01/2012  Job:  [id number]

## 2012-05-02 ENCOUNTER — Encounter (HOSPITAL_COMMUNITY): Payer: Self-pay | Admitting: Orthopedic Surgery

## 2012-05-02 ENCOUNTER — Other Ambulatory Visit: Payer: Self-pay | Admitting: Orthopedic Surgery

## 2012-05-02 LAB — CBC
HCT: 31.3 % — ABNORMAL LOW (ref 39.0–52.0)
MCHC: 34.2 g/dL (ref 30.0–36.0)
MCV: 78.6 fL (ref 78.0–100.0)
Platelets: 302 10*3/uL (ref 150–400)
RDW: 15.3 % (ref 11.5–15.5)
WBC: 8.2 10*3/uL (ref 4.0–10.5)

## 2012-05-02 MED ORDER — AMOXICILLIN-POT CLAVULANATE 500-125 MG PO TABS
1.0000 | ORAL_TABLET | Freq: Three times a day (TID) | ORAL | Status: DC
  Administered 2012-05-02 – 2012-05-03 (×2): 500 mg via ORAL
  Filled 2012-05-02 (×4): qty 1

## 2012-05-02 MED FILL — Sodium Chloride IV Soln 0.9%: INTRAVENOUS | Qty: 1000 | Status: AC

## 2012-05-02 NOTE — Progress Notes (Signed)
Physical Therapy Treatment Patient Details Name: Chris [name] MRN: [id number] DOB: 02/08/1940 Today's Date: 05/02/2012 Time: 4098-1191 PT Time Calculation (min): 24 min  PT Assessment / Plan / Recommendation Comments on Treatment Session  patient ambulated well with hand held assist.  patient disoriented to place.    Follow Up Recommendations  Supervision/Assistance - 24 hour;Home health PT     Does the patient have the potential to tolerate intense rehabilitation     Barriers to Discharge        Equipment Recommendations  Other (comment)    Recommendations for Other Services    Frequency Min 5X/week   Plan Discharge plan remains appropriate;Frequency remains appropriate    Precautions / Restrictions Precautions Precautions: Fall Restrictions Weight Bearing Restrictions: Yes RLE Weight Bearing: Weight bearing as tolerated   Pertinent Vitals/Pain no apparent distress     Mobility  Transfers Transfers: Sit to Stand;Stand to Sit Sit to Stand: 4: Min assist;From chair/3-in-1;With upper extremity assist;With armrests Stand to Sit: 4: Min assist;With upper extremity assist;With armrests;To chair/3-in-1 Details for Transfer Assistance: Still learning to control descent.  VC for hand placement. Ambulation/Gait Ambulation/Gait Assistance: 1: +2 Total assist Ambulation/Gait: Patient Percentage: 80% Ambulation Distance (Feet): 50 Feet Ambulation/Gait Assistance Details: Patient ambulated with hand held assist.  Used +2 for balance and assistance as shifting weight from one foot to the other. Patient responded well to hand held rather than walker. Gait Pattern: Decreased step length - right;Decreased step length - left;Trunk flexed Gait velocity: slowed    Exercises     PT Diagnosis:    PT Problem List:   PT Treatment Interventions:     PT Goals Acute Rehab PT Goals PT Goal: Sit to Stand - Progress: Progressing toward goal PT Goal: Stand to Sit - Progress: Progressing  toward goal PT Goal: Ambulate - Progress: Progressing toward goal  Visit Information  Last PT Received On: 05/02/12 Assistance Needed: +2    Subjective Data      Cognition  Cognition Arousal/Alertness: Awake/alert Behavior During Therapy: WFL for tasks assessed/performed Overall Cognitive Status: No family/caregiver present to determine baseline cognitive functioning    Balance     End of Session PT - End of Session Equipment Utilized During Treatment: Gait belt Activity Tolerance: Patient tolerated treatment well Patient left: in chair;with call bell/phone within reach;with chair alarm set   GP     Damieon Armendariz JEAN SPTA 05/02/2012, 1:41 PM

## 2012-05-02 NOTE — Progress Notes (Signed)
Subjective: 2 Days Post-Op Procedure(s) (LRB): IRRIGATION AND DEBRIDEMENT HIP (Right) Patient reports pain as 3 on 0-10 scale.    Objective: Vital signs in last 24 hours: Temp:  [97.4 F (36.3 C)-98.6 F (37 C)] 98.3 F (36.8 C) (04/28 1428) Pulse Rate:  [80-103] 94 (04/28 1428) Resp:  [16-18] 16 (04/28 1428) BP: (118-153)/(50-65) 134/52 mmHg (04/28 1428) SpO2:  [98 %-100 %] 98 % (04/28 1428)  Intake/Output from previous day: 04/27 0701 - 04/28 0700 In: 2470 [P.O.:420; I.V.:500; Blood:350; IV Piggyback:1200] Out: 1175 [Urine:1100; Drains:75] Intake/Output this shift:     Recent Labs  04/30/12 0900 05/02/12 1020  HGB 8.1* 10.7*    Recent Labs  04/30/12 0900 05/02/12 1020  WBC 7.3 8.2  RBC 3.15* 3.98*  HCT 24.7* 31.3*  PLT 267 302    Recent Labs  04/30/12 0900  NA 134*  K 4.0  CL 100  CO2 25  BUN 8  CREATININE 0.89  GLUCOSE 100*  CALCIUM 8.5   No results found for this basename: LABPT, INR,  in the last 72 hours  Neurovascular intact Dorsiflexion/Plantar flexion intact No cellulitis present  Assessment/Plan: 2 Days Post-Op Procedure(s) (LRB): IRRIGATION AND DEBRIDEMENT HIP (Right) Up with therapy D/C IV fluids Discharge home with home health  Kairos Panetta F 05/02/2012, 6:02 PM

## 2012-05-02 NOTE — Progress Notes (Signed)
PT is recommending home with [facility] and not SNF. CSW will make CM aware. Patient's daughter is home and can provide 24 hour supervision for the patient. Clinical Social Worker will sign off for now as social work intervention is no longer needed. Please consult [place] again if new need arises.   Sabino Niemann, MSW [phone]

## 2012-05-02 NOTE — Progress Notes (Signed)
Seen and agreed 05/02/2012 Benjaman Artman Elizabeth PTA [phone] pager [phone] office    

## 2012-05-03 LAB — TYPE AND SCREEN
ABO/RH(D): B POS
Antibody Screen: NEGATIVE
Unit division: 0

## 2012-05-03 LAB — ANAEROBIC CULTURE

## 2012-05-03 MED ORDER — AMOXICILLIN-POT CLAVULANATE 500-125 MG PO TABS: 1.0000 | ORAL_TABLET | Freq: Three times a day (TID) | ORAL | Status: DC

## 2012-05-03 MED ORDER — OXYCODONE-ACETAMINOPHEN 5-325 MG PO TABS: 1.0000 | ORAL_TABLET | ORAL | Status: DC | PRN

## 2012-05-03 NOTE — Progress Notes (Signed)
Seen and agreed 05/03/2012 Yitta Gongaware Elizabeth PTA [phone] pager [phone] office    

## 2012-05-03 NOTE — Progress Notes (Signed)
Physical Therapy Treatment Patient Details Name: Chris [name] MRN: [id number] DOB: 05/01/1940 Today's Date: 05/03/2012 Time: 9629-5284 PT Time Calculation (min): 26 min  PT Assessment / Plan / Recommendation Comments on Treatment Session  Patient was completely soiled upon arrival.  Patient has 24 hour home assistance.  Believe patient back to prior level of functioning.  Ambulation limited as the focus was on cleaning.    Follow Up Recommendations  Supervision/Assistance - 24 hour;Home health PT     Does the patient have the potential to tolerate intense rehabilitation     Barriers to Discharge        Equipment Recommendations  Other (comment)    Recommendations for Other Services    Frequency Min 5X/week   Plan Discharge plan remains appropriate;Frequency remains appropriate    Precautions / Restrictions Precautions Precautions: Fall Restrictions Weight Bearing Restrictions: Yes RLE Weight Bearing: Weight bearing as tolerated   Pertinent Vitals/Pain Unable to communicate with patient to find pain level.    Mobility  Bed Mobility Bed Mobility: Supine to Sit;Sitting - Scoot to Edge of Bed Supine to Sit: 3: Mod assist Sitting - Scoot to Edge of Bed: 4: Min assist Details for Bed Mobility Assistance: VC for technique and to remain on task. Transfers Transfers: Sit to Stand;Stand to Sit Sit to Stand: 4: Min assist;With upper extremity assist;With armrests;From bed Stand to Sit: 4: Min assist;With upper extremity assist;With armrests;To chair/3-in-1 Details for Transfer Assistance:  Verbal and tactile cues for hand placement. Ambulation/Gait Ambulation/Gait Assistance: 1: +2 Total assist Ambulation/Gait: Patient Percentage: 80% Ambulation Distance (Feet): 8 Feet Assistive device: Rolling walker Ambulation/Gait Assistance Details: Patient ambulating with hand held assist to chair. Gait Pattern: Decreased step length - right;Decreased step length - left;Trunk  flexed Gait velocity: slowed    Exercises     PT Diagnosis:    PT Problem List:   PT Treatment Interventions:     PT Goals Acute Rehab PT Goals PT Goal: Supine/Side to Sit - Progress: Progressing toward goal PT Goal: Sit to Supine/Side - Progress: Progressing toward goal PT Goal: Sit to Stand - Progress: Progressing toward goal PT Goal: Stand to Sit - Progress: Progressing toward goal PT Transfer Goal: Bed to Chair/Chair to Bed - Progress: Progressing toward goal PT Goal: Ambulate - Progress: Progressing toward goal  Visit Information  Last PT Received On: 05/03/12 Assistance Needed: +2    Subjective Data      Cognition  Cognition Arousal/Alertness: Awake/alert Behavior During Therapy: WFL for tasks assessed/performed (Disoriented but [facility] for therapy.) Overall Cognitive Status: No family/caregiver present to determine baseline cognitive functioning    Balance     End of Session PT - End of Session Equipment Utilized During Treatment: Gait belt Activity Tolerance: Patient tolerated treatment well Patient left: in chair;with call bell/phone within reach;with chair alarm set Nurse Communication: Mobility status;Other (comment) (Communication with nurse as patient was completely soiled.)   GP     Salene Mohamud JEAN SPTA 05/03/2012, 2:19 PM

## 2012-05-03 NOTE — Progress Notes (Signed)
Pt discharged to home accompanied by brother. Discharge instructions and rx given and explained and family stated understanding. Pts IV was removed prior to discharge. Pt was instructed to follow up with Dr. Montez Morita in one week. Pt left unit in a stable condition via wheelchair.

## 2012-05-04 ENCOUNTER — Other Ambulatory Visit: Payer: Medicare Other

## 2012-05-04 NOTE — Discharge Summary (Signed)
NAMEMELQUAN, ERNSBERGER NO.:  [id number]  MEDICAL RECORD NO.:  [id number]  LOCATION:  [id number]                        FACILITY:  [facility]  PHYSICIAN:  Myrtie Neither, MD      DATE OF BIRTH:  04/16/1940  DATE OF ADMISSION:  04/28/2012 DATE OF DISCHARGE:  05/03/2012                              DISCHARGE SUMMARY   ADMITTING DIAGNOSIS:  Nonhealing open wound, right hip.  FINAL DIAGNOSIS:  Abscess, right hip; nonhealing wound, right hip; infection, right hip.  COMPLICATIONS:  None.  Infection, right hip.  OPERATION: 1. Incision and drainage, and excisional debridement, right hip with     packing 2. Excisional debridement and delayed wound closure, right hip.  PERTINENT HISTORY:  This is a 72 year old male who had being followed in the office for previously nonhealing wound to his right hip.  The patient had previously several months ago had I and D and debride and the wound healed all way down to approximately an inch with granulomatous entrance over the right hip.  Very minimal drainage was noted, but the patient's dressings were noted to have some serous material.  The patient was brought to the operating room for debridement and further evaluation and possible wound closure.  Pertinent physical was that of the right hip, 1-inch granulomatous wound over the lateral aspect of the right hip.  Minimal serous material, nontender, no increased warmth.  The patient is afebrile.  White cell count was normal.  HOSPITAL COURSE:  The patient underwent preop labs, CBC, UA, C-met, urinalysis.  The patient was found to be stable enough to undergo surgery.  Doing surgery, the patient was found to have large boarder of abscess space in the posterior aspect of the hip, all the way down to the ischium.  Fluid itself was not right purulent material, but aerobic, anaerobic and Gram stains were done, and the wound was expanded both proximally and distally.  The wound was packed.  The  patient had a CT scan to evaluate for any other possible abscess formation and was found to have the present abscess down to the ischium with packing.  No evidence of osteomyelitis.  No evidence of involvement of his left total hip implant.  The patient was taken back to the operating room.  Further excisional debridement was done of the area.  Wound closure was done, reattaching the subcutaneous tissue down to the fascia, followed the way out.  Hemovac drain was placed into the wound.  The closure was possible.  Wound itself had good pink healthy tissue.  No evidence of any purulent material or other pockets of infection.  The patient was placed on IV vancomycin after the first operation and continued up until May 02, 2012, in which he was switched over to amoxicillin 500 mg t.i.d.  The patient has remained afebrile.  H and H is stable.  White cell count was normal.  The patient is being discharged home to continue amoxicillin 500 mg t.i.d. dressing changes to the right hip.  Pain control with the use of the Percocet 1 q.6 p.r.n. for pain, and to return to the office in 1 week.  The patient is weightbearing as tolerated with  the use of walker. The patient is being discharged in stable and satisfactory condition.     Myrtie Neither, MD     AC/MEDQ  D:  05/03/2012  T:  05/04/2012  Job:  [id number]

## 2012-05-23 ENCOUNTER — Other Ambulatory Visit: Payer: Self-pay | Admitting: Orthopedic Surgery

## 2012-05-24 ENCOUNTER — Encounter (HOSPITAL_COMMUNITY): Payer: Self-pay | Admitting: Pharmacy Technician

## 2012-05-25 ENCOUNTER — Encounter (HOSPITAL_COMMUNITY)
Admission: RE | Admit: 2012-05-25 | Discharge: 2012-05-25 | Disposition: A | Discharge status: Home / Self Care | Payer: Medicare Other | Source: Ambulatory Visit | Attending: Orthopedic Surgery | Admitting: Orthopedic Surgery

## 2012-05-25 ENCOUNTER — Encounter (HOSPITAL_COMMUNITY): Payer: Self-pay

## 2012-05-25 LAB — CBC
Hemoglobin: 10.6 g/dL — ABNORMAL LOW (ref 13.0–17.0)
MCH: 27 pg (ref 26.0–34.0)
MCHC: 32.8 g/dL (ref 30.0–36.0)
Platelets: 306 10*3/uL (ref 150–400)
RBC: 3.92 MIL/uL — ABNORMAL LOW (ref 4.22–5.81)

## 2012-05-25 LAB — COMPREHENSIVE METABOLIC PANEL
ALT: 9 U/L (ref 0–53)
AST: 19 U/L (ref 0–37)
Alkaline Phosphatase: 99 U/L (ref 39–117)
Calcium: 9.1 mg/dL (ref 8.4–10.5)
Potassium: 4.2 mEq/L (ref 3.5–5.1)
Sodium: 140 mEq/L (ref 135–145)
Total Protein: 7.5 g/dL (ref 6.0–8.3)

## 2012-05-25 LAB — URINALYSIS, ROUTINE W REFLEX MICROSCOPIC
Bilirubin Urine: NEGATIVE
Glucose, UA: NEGATIVE mg/dL
Hgb urine dipstick: NEGATIVE
Ketones, ur: NEGATIVE mg/dL
Nitrite: NEGATIVE
Specific Gravity, Urine: 1.015 (ref 1.005–1.030)
pH: 6 (ref 5.0–8.0)

## 2012-05-25 LAB — SURGICAL PCR SCREEN: Staphylococcus aureus: NEGATIVE

## 2012-05-25 MED ORDER — VANCOMYCIN HCL IN DEXTROSE 1-5 GM/200ML-% IV SOLN
1000.0000 mg | INTRAVENOUS | Status: AC
  Administered 2012-05-26: 1000 mg via INTRAVENOUS
  Filled 2012-05-25: qty 200

## 2012-05-25 MED ORDER — CHLORHEXIDINE GLUCONATE 4 % EX LIQD: 60.0000 mL | Freq: Once | CUTANEOUS | Status: DC

## 2012-05-25 NOTE — Pre-Procedure Instructions (Signed)
Chris [name]  05/25/2012   Your procedure is scheduled on:  Thursday, May 22  Report to [facility] at 0530 AM.  Call this number if you have problems the morning of surgery: [phone]   Remember:   Do not eat food or drink liquids after midnight.tonight   Take these medicines the morning of surgery with A SIP OF WATER: Haldol   Do not wear jewelry, make-up or nail polish.  Do not wear lotions, powders, perfume or wear deodorant.     Do not bring valuables to the hospital.  Contacts, dentures or bridgework may not be worn into surgery.  Leave suitcase in the car. After surgery it may be brought to your room.  For patients admitted to the hospital, checkout time is 11:00 AM the day of  discharge.     Special Instructions: Shower using CHG 2 nights before surgery and the night before surgery.  If you shower the day of surgery use CHG.  Use special wash - you have one bottle of CHG for all showers.  You should use approximately 1/3 of the bottle for each shower.   Please read over the following fact sheets that you were given: Pain Booklet, Coughing and Deep Breathing and Surgical Site Infection Prevention

## 2012-05-26 ENCOUNTER — Inpatient Hospital Stay (HOSPITAL_COMMUNITY)
Admission: RE | Admit: 2012-05-26 | Discharge: 2012-05-31 | DRG: 857 | Disposition: A | Discharge status: Skilled Nursing Facility | Payer: Medicare Other | Source: Ambulatory Visit | Attending: Orthopedic Surgery | Admitting: Orthopedic Surgery

## 2012-05-26 ENCOUNTER — Encounter (HOSPITAL_COMMUNITY): Payer: Self-pay | Admitting: Surgery

## 2012-05-26 ENCOUNTER — Encounter (HOSPITAL_COMMUNITY): Payer: Self-pay | Admitting: Anesthesiology

## 2012-05-26 ENCOUNTER — Ambulatory Visit (HOSPITAL_COMMUNITY): Payer: Medicare Other | Admitting: Anesthesiology

## 2012-05-26 ENCOUNTER — Encounter (HOSPITAL_COMMUNITY)
Admission: RE | Disposition: A | Discharge status: Skilled Nursing Facility | Payer: Self-pay | Source: Ambulatory Visit | Attending: Orthopedic Surgery

## 2012-05-26 DIAGNOSIS — J4489 Other specified chronic obstructive pulmonary disease: Secondary | ICD-10-CM | POA: Diagnosis present

## 2012-05-26 DIAGNOSIS — Z8546 Personal history of malignant neoplasm of prostate: Secondary | ICD-10-CM

## 2012-05-26 DIAGNOSIS — Z96649 Presence of unspecified artificial hip joint: Secondary | ICD-10-CM

## 2012-05-26 DIAGNOSIS — T8131XA Disruption of external operation (surgical) wound, not elsewhere classified, initial encounter: Secondary | ICD-10-CM | POA: Diagnosis present

## 2012-05-26 DIAGNOSIS — Z7982 Long term (current) use of aspirin: Secondary | ICD-10-CM

## 2012-05-26 DIAGNOSIS — I509 Heart failure, unspecified: Secondary | ICD-10-CM | POA: Diagnosis present

## 2012-05-26 DIAGNOSIS — Z01812 Encounter for preprocedural laboratory examination: Secondary | ICD-10-CM

## 2012-05-26 DIAGNOSIS — D509 Iron deficiency anemia, unspecified: Secondary | ICD-10-CM | POA: Diagnosis present

## 2012-05-26 DIAGNOSIS — F17200 Nicotine dependence, unspecified, uncomplicated: Secondary | ICD-10-CM | POA: Diagnosis present

## 2012-05-26 DIAGNOSIS — Z79899 Other long term (current) drug therapy: Secondary | ICD-10-CM

## 2012-05-26 DIAGNOSIS — I080 Rheumatic disorders of both mitral and aortic valves: Secondary | ICD-10-CM | POA: Diagnosis present

## 2012-05-26 DIAGNOSIS — J449 Chronic obstructive pulmonary disease, unspecified: Secondary | ICD-10-CM | POA: Diagnosis present

## 2012-05-26 DIAGNOSIS — Y838 Other surgical procedures as the cause of abnormal reaction of the patient, or of later complication, without mention of misadventure at the time of the procedure: Secondary | ICD-10-CM | POA: Diagnosis present

## 2012-05-26 DIAGNOSIS — F0390 Unspecified dementia without behavioral disturbance: Secondary | ICD-10-CM

## 2012-05-26 DIAGNOSIS — T8140XA Infection following a procedure, unspecified, initial encounter: Principal | ICD-10-CM | POA: Diagnosis present

## 2012-05-26 DIAGNOSIS — I4891 Unspecified atrial fibrillation: Secondary | ICD-10-CM | POA: Diagnosis present

## 2012-05-26 DIAGNOSIS — G8918 Other acute postprocedural pain: Secondary | ICD-10-CM

## 2012-05-26 HISTORY — PX: INCISION AND DRAINAGE HIP: SHX1801

## 2012-05-26 LAB — GLUCOSE, CAPILLARY: Glucose-Capillary: 98 mg/dL (ref 70–99)

## 2012-05-26 LAB — CBC
HCT: 33.3 % — ABNORMAL LOW (ref 39.0–52.0)
Hemoglobin: 10.9 g/dL — ABNORMAL LOW (ref 13.0–17.0)
MCH: 27.1 pg (ref 26.0–34.0)
MCV: 82.8 fL (ref 78.0–100.0)
RBC: 4.02 MIL/uL — ABNORMAL LOW (ref 4.22–5.81)
WBC: 5.9 10*3/uL (ref 4.0–10.5)

## 2012-05-26 LAB — CREATININE, SERUM: GFR calc Af Amer: >90 mL/min (ref >90)

## 2012-05-26 SURGERY — IRRIGATION AND DEBRIDEMENT HIP
Anesthesia: General | Site: Hip | Laterality: Right | Wound class: Dirty or Infected

## 2012-05-26 MED ORDER — LIDOCAINE HCL (CARDIAC) 20 MG/ML IV SOLN
INTRAVENOUS | Status: DC | PRN
  Administered 2012-05-26: 40 mg via INTRAVENOUS

## 2012-05-26 MED ORDER — SUCCINYLCHOLINE CHLORIDE 20 MG/ML IJ SOLN
INTRAMUSCULAR | Status: DC | PRN
  Administered 2012-05-26: 100 mg via INTRAVENOUS

## 2012-05-26 MED ORDER — PHENYLEPHRINE HCL 10 MG/ML IJ SOLN
INTRAMUSCULAR | Status: DC | PRN
  Administered 2012-05-26: 80 ug via INTRAVENOUS
  Administered 2012-05-26 (×2): 40 ug via INTRAVENOUS

## 2012-05-26 MED ORDER — ENOXAPARIN SODIUM 40 MG/0.4ML ~~LOC~~ SOLN
40.0000 mg | SUBCUTANEOUS | Status: DC
  Administered 2012-05-27 – 2012-05-31 (×5): 40 mg via SUBCUTANEOUS
  Filled 2012-05-26 (×7): qty 0.4

## 2012-05-26 MED ORDER — VANCOMYCIN HCL IN DEXTROSE 1-5 GM/200ML-% IV SOLN
1000.0000 mg | Freq: Two times a day (BID) | INTRAVENOUS | Status: AC
  Administered 2012-05-26: 1000 mg via INTRAVENOUS
  Filled 2012-05-26: qty 200

## 2012-05-26 MED ORDER — HYDROCODONE-ACETAMINOPHEN 5-325 MG PO TABS
1.0000 | ORAL_TABLET | Freq: Four times a day (QID) | ORAL | Status: DC | PRN
  Administered 2012-05-26 – 2012-05-31 (×5): 2 via ORAL
  Filled 2012-05-26 (×5): qty 2

## 2012-05-26 MED ORDER — ONDANSETRON HCL 4 MG/2ML IJ SOLN
INTRAMUSCULAR | Status: DC | PRN
  Administered 2012-05-26: 4 mg via INTRAVENOUS

## 2012-05-26 MED ORDER — ARTIFICIAL TEARS OP OINT
TOPICAL_OINTMENT | OPHTHALMIC | Status: DC | PRN
  Administered 2012-05-26: 1 via OPHTHALMIC

## 2012-05-26 MED ORDER — HYDROMORPHONE HCL PF 1 MG/ML IJ SOLN: 0.1000 mg | INTRAMUSCULAR | Status: DC | PRN

## 2012-05-26 MED ORDER — METOCLOPRAMIDE HCL 5 MG PO TABS
5.0000 mg | ORAL_TABLET | Freq: Three times a day (TID) | ORAL | Status: DC | PRN
  Filled 2012-05-26: qty 2

## 2012-05-26 MED ORDER — FERROUS SULFATE 325 (65 FE) MG PO TABS
325.0000 mg | ORAL_TABLET | Freq: Two times a day (BID) | ORAL | Status: DC
  Administered 2012-05-26 – 2012-05-31 (×10): 325 mg via ORAL
  Filled 2012-05-26 (×12): qty 1

## 2012-05-26 MED ORDER — ACETAMINOPHEN 650 MG RE SUPP: 650.0000 mg | Freq: Four times a day (QID) | RECTAL | Status: DC | PRN

## 2012-05-26 MED ORDER — METOCLOPRAMIDE HCL 5 MG/ML IJ SOLN: 5.0000 mg | Freq: Three times a day (TID) | INTRAMUSCULAR | Status: DC | PRN

## 2012-05-26 MED ORDER — ONDANSETRON HCL 4 MG PO TABS: 4.0000 mg | ORAL_TABLET | Freq: Four times a day (QID) | ORAL | Status: DC | PRN

## 2012-05-26 MED ORDER — FUROSEMIDE 20 MG PO TABS
20.0000 mg | ORAL_TABLET | Freq: Every day | ORAL | Status: DC
  Administered 2012-05-26 – 2012-05-31 (×6): 20 mg via ORAL
  Filled 2012-05-26 (×6): qty 1

## 2012-05-26 MED ORDER — PROPOFOL 10 MG/ML IV BOLUS
INTRAVENOUS | Status: DC | PRN
  Administered 2012-05-26: 100 mg via INTRAVENOUS

## 2012-05-26 MED ORDER — ASPIRIN EC 325 MG PO TBEC
325.0000 mg | DELAYED_RELEASE_TABLET | Freq: Every day | ORAL | Status: DC
  Administered 2012-05-27 – 2012-05-31 (×5): 325 mg via ORAL
  Filled 2012-05-26 (×6): qty 1

## 2012-05-26 MED ORDER — SODIUM CHLORIDE 0.9 % IV SOLN
INTRAVENOUS | Status: DC | PRN
  Administered 2012-05-26: 08:00:00 via INTRAVENOUS

## 2012-05-26 MED ORDER — DEXTROSE-NACL 5-0.45 % IV SOLN
INTRAVENOUS | Status: DC
  Administered 2012-05-26: 15:00:00 via INTRAVENOUS

## 2012-05-26 MED ORDER — SODIUM CHLORIDE 0.9 % IR SOLN
Status: DC | PRN
  Administered 2012-05-26: 1000 mL
  Administered 2012-05-26: 3000 mL

## 2012-05-26 MED ORDER — ACETAMINOPHEN 10 MG/ML IV SOLN: 1000.0000 mg | Freq: Once | INTRAVENOUS | Status: DC | PRN

## 2012-05-26 MED ORDER — MELOXICAM 15 MG PO TABS
15.0000 mg | ORAL_TABLET | Freq: Every day | ORAL | Status: DC
  Administered 2012-05-26 – 2012-05-31 (×6): 15 mg via ORAL
  Filled 2012-05-26 (×6): qty 1

## 2012-05-26 MED ORDER — HALOPERIDOL 0.5 MG PO TABS
0.5000 mg | ORAL_TABLET | Freq: Four times a day (QID) | ORAL | Status: DC | PRN
Start: 2012-05-26 — End: 2012-05-31
  Administered 2012-05-28 – 2012-05-31 (×3): 0.5 mg via ORAL
  Filled 2012-05-26 (×4): qty 1

## 2012-05-26 MED ORDER — ACETAMINOPHEN 325 MG PO TABS
650.0000 mg | ORAL_TABLET | Freq: Four times a day (QID) | ORAL | Status: DC | PRN
  Administered 2012-05-28: 650 mg via ORAL
  Filled 2012-05-26: qty 2

## 2012-05-26 MED ORDER — LACTATED RINGERS IV SOLN
INTRAVENOUS | Status: DC | PRN
  Administered 2012-05-26 (×2): via INTRAVENOUS

## 2012-05-26 MED ORDER — SODIUM CHLORIDE 0.9 % IR SOLN
Status: DC | PRN
  Administered 2012-05-26: 09:00:00

## 2012-05-26 MED ORDER — DROPERIDOL 2.5 MG/ML IJ SOLN: 0.6250 mg | INTRAMUSCULAR | Status: DC | PRN

## 2012-05-26 MED ORDER — MENTHOL 3 MG MT LOZG: 1.0000 | LOZENGE | OROMUCOSAL | Status: DC | PRN

## 2012-05-26 MED ORDER — FENTANYL CITRATE 0.05 MG/ML IJ SOLN
INTRAMUSCULAR | Status: DC | PRN
  Administered 2012-05-26: 50 ug via INTRAVENOUS
  Administered 2012-05-26: 100 ug via INTRAVENOUS

## 2012-05-26 MED ORDER — PHENOL 1.4 % MT LIQD: 1.0000 | OROMUCOSAL | Status: DC | PRN

## 2012-05-26 MED ORDER — FENTANYL CITRATE 0.05 MG/ML IJ SOLN: 25.0000 ug | INTRAMUSCULAR | Status: DC | PRN

## 2012-05-26 MED ORDER — ONDANSETRON HCL 4 MG/2ML IJ SOLN: 4.0000 mg | Freq: Four times a day (QID) | INTRAMUSCULAR | Status: DC | PRN

## 2012-05-26 SURGICAL SUPPLY — 44 items
CLOTH BEACON ORANGE TIMEOUT ST (SAFETY) ×2 IMPLANT
COVER SURGICAL LIGHT HANDLE (MISCELLANEOUS) ×2 IMPLANT
DRAPE ORTHO SPLIT 77X108 STRL (DRAPES) ×4
DRAPE SURG ORHT 6 SPLT 77X108 (DRAPES) ×2 IMPLANT
DRAPE U-SHAPE 47X51 STRL (DRAPES) ×2 IMPLANT
DRSG ADAPTIC 3X8 NADH LF (GAUZE/BANDAGES/DRESSINGS) ×2 IMPLANT
DRSG PAD ABDOMINAL 8X10 ST (GAUZE/BANDAGES/DRESSINGS) ×2 IMPLANT
DURAPREP 26ML APPLICATOR (WOUND CARE) ×1 IMPLANT
ELECT CAUTERY BLADE 6.4 (BLADE) ×1 IMPLANT
ELECT REM PT RETURN 9FT ADLT (ELECTROSURGICAL) ×2
ELECTRODE REM PT RTRN 9FT ADLT (ELECTROSURGICAL) IMPLANT
EVACUATOR 3/16  PVC DRAIN (DRAIN) ×1
EVACUATOR 3/16 PVC DRAIN (DRAIN) IMPLANT
GAUZE SPONGE 4X4 16PLY XRAY LF (GAUZE/BANDAGES/DRESSINGS) ×1 IMPLANT
GLOVE BIO SURGEON STRL SZ7 (GLOVE) ×1 IMPLANT
GLOVE ECLIPSE 9.0 STRL (GLOVE) ×2 IMPLANT
GLOVE NEODERM STER SZ 7 (GLOVE) ×1 IMPLANT
GLOVE SS PI 9.0 STRL (GLOVE) ×2 IMPLANT
GOWN PREVENTION PLUS XLARGE (GOWN DISPOSABLE) ×2 IMPLANT
GOWN STRL NON-REIN LRG LVL3 (GOWN DISPOSABLE) ×2 IMPLANT
HANDPIECE INTERPULSE COAX TIP (DISPOSABLE) ×2
KIT BASIN OR (CUSTOM PROCEDURE TRAY) ×2 IMPLANT
KIT ROOM TURNOVER OR (KITS) ×2 IMPLANT
MANIFOLD NEPTUNE II (INSTRUMENTS) ×2 IMPLANT
NS IRRIG 1000ML POUR BTL (IV SOLUTION) ×2 IMPLANT
PACK TOTAL JOINT (CUSTOM PROCEDURE TRAY) ×2 IMPLANT
PAD ARMBOARD 7.5X6 YLW CONV (MISCELLANEOUS) ×4 IMPLANT
SCRUB BETADINE 4OZ XXX (MISCELLANEOUS) ×1 IMPLANT
SET HNDPC FAN SPRY TIP SCT (DISPOSABLE) IMPLANT
SOLUTION BETADINE 4OZ (MISCELLANEOUS) ×1 IMPLANT
SPONGE GAUZE 4X4 12PLY (GAUZE/BANDAGES/DRESSINGS) ×2 IMPLANT
SPONGE LAP 18X18 X RAY DECT (DISPOSABLE) ×1 IMPLANT
SUT ETHILON 2 0 PSLX (SUTURE) ×1 IMPLANT
SUT ETHILON 2 LR (SUTURE) ×3 IMPLANT
SUT VIC AB 1 CT1 27 (SUTURE) ×2
SUT VIC AB 1 CT1 27XBRD ANBCTR (SUTURE) IMPLANT
SUT VIC AB 2-0 CT1 27 (SUTURE) ×2
SUT VIC AB 2-0 CT1 TAPERPNT 27 (SUTURE) IMPLANT
TAPE CLOTH SURG 6X10 WHT LF (GAUZE/BANDAGES/DRESSINGS) ×1 IMPLANT
TOWEL OR 17X24 6PK STRL BLUE (TOWEL DISPOSABLE) ×2 IMPLANT
TOWEL OR 17X26 10 PK STRL BLUE (TOWEL DISPOSABLE) ×2 IMPLANT
TUBE ANAEROBIC SPECIMEN COL (MISCELLANEOUS) ×1 IMPLANT
UNDERPAD 30X30 INCONTINENT (UNDERPADS AND DIAPERS) ×1 IMPLANT
WATER STERILE IRR 1000ML POUR (IV SOLUTION) ×2 IMPLANT

## 2012-05-26 NOTE — Preoperative (Signed)
Beta Blockers   Reason not to administer Beta Blockers:Not Applicable 

## 2012-05-26 NOTE — H&P (Signed)
Chris [name] is an 72 y.o. male.   Chief Complaint: OPEN WOUND RIGHT HIP HPI: THIS IS A 72 Y/O MALE WHO WAS RECENTLY TREATED WITH RIGHT  HIP DEBRIDEMENT AND WOUND CLOSURE 3 WEEKS AGO . PATIENT FELL AROUND 3 AM AT HOME AND BROKE OPEN HIS WOUND . PATIENT IS CARED FOR BY HIS SISTER AND NOW AGREES TO PLACING HIM IN A NURSING HOME UNTIL WOUND HEALING TAKES PLACE.  Past Medical History  Diagnosis Date  . Poor dentition   . COPD (chronic obstructive pulmonary disease)   . CHF (congestive heart failure)   . History of blood transfusion 08/19/2011    "first time"  . Prostate cancer 2011    seed implant  . Atrial flutter     h/o; successful cardioversion,TEE guided  . Iron deficiency anemia     HX BLOOD TX  . Aortic valve regurgitation     moderate AR by echo 2010  . Mitral valve regurgitation     moderate to severe by 2D, mod by TEE '10  . Dementia     Past Surgical History  Procedure Laterality Date  . 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 Esophagogastroduodenoscopy  08/21/2011    Procedure: ESOPHAGOGASTRODUODENOSCOPY (EGD);  Surgeon: Shirley Friar, MD;  Location: [facility] ENDOSCOPY;  Service: Endoscopy;  Laterality: N/A;  . Colonoscopy  08/21/2011    Procedure: COLONOSCOPY;  Surgeon: Shirley Friar, MD;  Location: [facility] ENDOSCOPY;  Service: Endoscopy;  Laterality: N/A;  . Joint replacement    . Irrigation and debridement knee  12/23/2011  . I&d extremity  12/24/2011    Procedure: IRRIGATION AND DEBRIDEMENT EXTREMITY;  Surgeon: Kennieth Rad, MD;  Location: [facility] OR;  Service: Orthopedics;  Laterality: Right;  POSSIBLE WOUND CLOSURE  . Incision and drainage of wound  12/26/2011    Procedure: IRRIGATION AND DEBRIDEMENT WOUND;  Surgeon: Kennieth Rad, MD;  Location: [facility] OR;  Service: Orthopedics;  Laterality: Right;  I&D of Wound Right Hip and Wound Closure  . Incision and drainage of wound Right 04/28/2012    Procedure: IRRIGATION AND DEBRIDEMENT WOUND  RIGHT HIP POSSIBLE CLOSURE;  Surgeon: Kennieth Rad, MD;  Location: [facility] OR;  Service: Orthopedics;  Laterality: Right;  . Incision and drainage hip Right 04/30/2012    Procedure: IRRIGATION AND DEBRIDEMENT HIP;  Surgeon: Kennieth Rad, MD;  Location: [facility] OR;  Service: Orthopedics;  Laterality: Right;    History reviewed. No pertinent family history. Social History:  reports that he has been smoking Cigarettes.  He has a 57 pack-year smoking history. He has never used smokeless tobacco. He reports that he does not use illicit drugs. His alcohol history is not on file.  Allergies:  Allergies  Allergen Reactions  . Penicillins Nausea And Vomiting    Medications Prior to Admission  Medication Sig Dispense Refill  . amoxicillin-clavulanate (AUGMENTIN) 500-125 MG per tablet Take 1 tablet by mouth 3 (three) times daily. 30 day supply filled 05/03/2012      . aspirin EC 325 MG EC tablet Take 1 tablet (325 mg total) by mouth daily with breakfast.  30 tablet  3  . ferrous sulfate 325 (65 FE) MG tablet Take 325 mg by mouth 2 (two) times daily.      . furosemide (LASIX) 20 MG tablet Take 20 mg by mouth daily.      . haloperidol (HALDOL) 1 MG tablet Take 0.5 mg by mouth every 6 (six)  hours as needed (agitation).       . meloxicam (MOBIC) 15 MG tablet Take 15 mg by mouth daily.        Results for orders placed during the hospital encounter of 05/25/12 (from the past 48 hour(s))  URINALYSIS, ROUTINE W REFLEX MICROSCOPIC     Status: None   Collection Time    05/25/12  2:39 PM      Result Value Range   Color, Urine YELLOW  YELLOW   APPearance CLEAR  CLEAR   Specific Gravity, Urine 1.015  1.005 - 1.030   pH 6.0  5.0 - 8.0   Glucose, UA NEGATIVE  NEGATIVE mg/dL   Hgb urine dipstick NEGATIVE  NEGATIVE   Bilirubin Urine NEGATIVE  NEGATIVE   Ketones, ur NEGATIVE  NEGATIVE mg/dL   Protein, ur NEGATIVE  NEGATIVE mg/dL   Urobilinogen, UA 0.2  0.0 - 1.0 mg/dL   Nitrite NEGATIVE  NEGATIVE   Leukocytes,  UA NEGATIVE  NEGATIVE   Comment: MICROSCOPIC NOT DONE ON URINES WITH NEGATIVE PROTEIN, BLOOD, LEUKOCYTES, NITRITE, OR GLUCOSE <1000 mg/dL.  SURGICAL PCR SCREEN     Status: None   Collection Time    05/25/12  2:39 PM      Result Value Range   MRSA, PCR NEGATIVE  NEGATIVE   Staphylococcus aureus NEGATIVE  NEGATIVE   Comment:            The Xpert SA Assay (FDA     approved for NASAL specimens     in patients over 19 years of age),     is one component of     a comprehensive surveillance     program.  Test performance has     been validated by [employer] for patients greater     than or equal to 93 year old.     It is not intended     to diagnose infection nor to     guide or monitor treatment.  CBC     Status: Abnormal   Collection Time    05/25/12  2:41 PM      Result Value Range   WBC 6.0  4.0 - 10.5 K/uL   RBC 3.92 (*) 4.22 - 5.81 MIL/uL   Hemoglobin 10.6 (*) 13.0 - 17.0 g/dL   HCT 29.5 (*) 62.1 - 30.8 %   MCV 82.4  78.0 - 100.0 fL   MCH 27.0  26.0 - 34.0 pg   MCHC 32.8  30.0 - 36.0 g/dL   RDW 65.7 (*) 84.6 - 96.2 %   Platelets 306  150 - 400 K/uL  COMPREHENSIVE METABOLIC PANEL     Status: Abnormal   Collection Time    05/25/12  2:41 PM      Result Value Range   Sodium 140  135 - 145 mEq/L   Potassium 4.2  3.5 - 5.1 mEq/L   Chloride 104  96 - 112 mEq/L   CO2 25  19 - 32 mEq/L   Glucose, Bld 78  70 - 99 mg/dL   BUN 10  6 - 23 mg/dL   Creatinine, Ser 9.52  0.50 - 1.35 mg/dL   Calcium 9.1  8.4 - 84.1 mg/dL   Total Protein 7.5  6.0 - 8.3 g/dL   Albumin 3.5  3.5 - 5.2 g/dL   AST 19  0 - 37 U/L   ALT 9  0 - 53 U/L   Alkaline Phosphatase 99  39 -  117 U/L   Total Bilirubin 0.2 (*) 0.3 - 1.2 mg/dL   GFR calc non Af Amer 87 (*) >90 mL/min   GFR calc Af Amer >90  >90 mL/min   Comment:            The eGFR has been calculated     using the CKD EPI equation.     This calculation has not been     validated in all clinical     situations.     eGFR's persistently      <90 mL/min signify     possible Chronic Kidney Disease.   No results found.  Review of Systems  Constitutional: Negative.   HENT: Negative.   Eyes: Negative.   Respiratory: Negative.   Cardiovascular: Negative.   Gastrointestinal: Negative.   Genitourinary: Negative.   Musculoskeletal: Positive for joint pain and falls.  Skin: Negative.   Neurological: Negative.   Endo/Heme/Allergies: Negative.   Psychiatric/Behavioral: Positive for memory loss.    Blood pressure 128/77, pulse 79, resp. rate 18, SpO2 100.00%. Physical Exam RIGHT HIP WITH A 2 INCH GAPPING WOUND WITH NO DRAINAGE, SKIN EDGE RETRACTED, PINK TISSUE BED.  Assessment/Plan RIGHT HIP OPEN WOUND/ PLAN DEBRIDEMENT CLOSURE, AND NURSING HOME PLACEMENT  Kennieth Rad 05/26/2012, 7:35 AM

## 2012-05-26 NOTE — Progress Notes (Signed)
Orthopedic Tech Progress Note Patient Details:  Chris [name] Feb 13, 1940 [id number] Applied overhead frame.     Jennye Moccasin 05/26/2012, 4:55 PM

## 2012-05-26 NOTE — Progress Notes (Signed)
During pre-op while verifying consent patient informed Nurse that he was having surgery on right hip and not the left hip as consent stated. Nurse called Dr. Montez Morita and he stated that the consent should be for patient's right hip and not the left. Will change consent and have patient sign.

## 2012-05-26 NOTE — Progress Notes (Signed)
UR COMPLETED  

## 2012-05-26 NOTE — Anesthesia Preprocedure Evaluation (Addendum)
Anesthesia Evaluation  Patient identified by MRN, date of birth, ID band Patient awake    Reviewed: Allergy & Precautions, H&P , NPO status , Patient's Chart, lab work & pertinent test results  History of Anesthesia Complications Negative for: history of anesthetic complications  Airway Mallampati: I TM Distance: >3 FB Neck ROM: Full    Dental  (+) Dental Advisory Given, Missing, Poor Dentition and Loose   Pulmonary COPDCurrent Smoker,  breath sounds clear to auscultation  Pulmonary exam normal       Cardiovascular + dysrhythmias (has been cardioverted, now off Coumadin) Atrial Fibrillation Rhythm:Regular Rate:Normal  '10 ECHO: normal LVF, EF 60-65%, mod MR, mod AI   Neuro/Psych dementia   GI/Hepatic negative GI ROS, Neg liver ROS,   Endo/Other  negative endocrine ROS  Renal/GU negative Renal ROS     Musculoskeletal   Abdominal   Peds  Hematology   Anesthesia Other Findings   Reproductive/Obstetrics                          Anesthesia Physical Anesthesia Plan  ASA: III  Anesthesia Plan: General   Post-op Pain Management:    Induction: Intravenous  Airway Management Planned: Oral ETT  Additional Equipment:   Intra-op Plan:   Post-operative Plan: Extubation in OR  Informed Consent: I have reviewed the patients History and Physical, chart, labs and discussed the procedure including the risks, benefits and alternatives for the proposed anesthesia with the patient or authorized representative who has indicated his/her understanding and acceptance.   Dental advisory given  Plan Discussed with: CRNA and Surgeon  Anesthesia Plan Comments: (Plan routine monitors, GETA)        Anesthesia Quick Evaluation

## 2012-05-26 NOTE — Op Note (Signed)
Chris [name], Chris [name] NO.:  [id number]  MEDICAL RECORD NO.:  [id number]  LOCATION:  5N02C                        FACILITY:  [facility]  PHYSICIAN:  Myrtie Neither, MD      DATE OF BIRTH:  07/09/1940  DATE OF PROCEDURE:  05/26/2012 DATE OF DISCHARGE:                              OPERATIVE REPORT   PREOPERATIVE DIAGNOSES:  Open wound, right hip.  Infection of right hip.  POSTOPERATIVE DIAGNOSES:  Open wound, right hip.  Infection of right hip.  ANESTHESIA:  General.  PROCEDURE:  Excisional debridement, right hip.  Wound closure, right hip.  Hemovac drain placed in right hip.  DESCRIPTION OF PROCEDURE:  The patient was taken to the operating room after given an adequate preop medications, given general anesthesia and intubated.  The patient was placed in the right lateral position.  Right hip was prepped with Betadine, scrubbed and painted with Betadine solution.  Right hip was then draped in sterile fashion.  The wound was 2-inch wound over the old previous surgical scar with granulation tissue, but the wound and clear serous fluid was in the wound.  Excision and debridement was done with the scalpel about the skin edges.  Several 0 sutures were removed.  Irrigation with antibiotic solution and Simpulse irrigator was done.  After adequate skin debridement, which was done down to the fascial tissue.  With the use of scissors, the subcutaneous skin was separated from the subcutaneous tissue to allow skin closure.  Soft tissue approximation was done with 1 Vicryl followed by use of heavy 2-0 nylon self-retaining sutures.  2-0 Vicryl was used for the subcutaneous tissue and heavy 2-0 #2 nylon with self-retention sutures were used for skin closure.  Large Hemovac drain was placed into the wound.  Compressive dressing was applied.  The patient tolerated the procedure quite well, went to the recovery room in stable and satisfactory condition.  The patient did have  aerobic and anaerobic cultures done with Gram stain.     Myrtie Neither, MD     AC/MEDQ  D:  05/26/2012  T:  05/26/2012  Job:  [id number]

## 2012-05-26 NOTE — OR Nursing (Signed)
Off monitor pending room assignment/ going to non monitored roomt. Ate 100% of reg diet .

## 2012-05-26 NOTE — Brief Op Note (Signed)
05/26/2012  9:05 AM  PATIENT:  Chris [name]  72 y.o. male  PRE-OPERATIVE DIAGNOSIS:  iopen wound right hip  POST-OPERATIVE DIAGNOSIS:  iopen wound right hip  PROCEDURE:  Procedure(s): IRRIGATION AND DEBRIDEMENT HIP and closure of wound (Right)  SURGEON:  Surgeon(s) and Role:    * Kennieth Rad, MD - Primary  PHYSICIAN ASSISTANT:   ASSISTANTS: none   ANESTHESIA:   general  EBL:  Total I/O In: 1200 [I.V.:1200] Out: -   BLOOD ADMINISTERED:none  DRAINS: (large hemovac placed in right hip ) Hemovact drain(s) in the right hip with  Suction Open   LOCAL MEDICATIONS USED:  NONE  SPECIMEN:  No Specimen  DISPOSITION OF SPECIMEN:  N/A  COUNTS:  YES  TOURNIQUET:  * No tourniquets in log *  DICTATION: .Other Dictation: Dictation Number REPORT #[id number]  PLAN OF CARE: Admit to inpatient   PATIENT DISPOSITION:  PACU - hemodynamically stable.   Delay start of Pharmacological VTE agent (>24hrs) due to surgical blood loss or risk of bleeding: no

## 2012-05-26 NOTE — Transfer of Care (Signed)
Immediate Anesthesia Transfer of Care Note  Patient: Chris [name]  Procedure(s) Performed: Procedure(s): IRRIGATION AND DEBRIDEMENT HIP and closure of wound (Right)  Patient Location: PACU  Anesthesia Type:General  Level of Consciousness: awake and patient cooperative  Airway & Oxygen Therapy: Patient Spontanous Breathing and Patient connected to face mask oxygen  Post-op Assessment: Report given to PACU RN, Post -op Vital signs reviewed and stable and Patient moving all extremities X 4  Post vital signs: Reviewed and stable  Complications: No apparent anesthesia complications

## 2012-05-26 NOTE — Anesthesia Postprocedure Evaluation (Signed)
  Anesthesia Post-op Note  Patient: Chris [name]  Procedure(s) Performed: Procedure(s): IRRIGATION AND DEBRIDEMENT HIP and closure of wound (Right)  Patient Location: PACU  Anesthesia Type:General  Level of Consciousness: awake, alert , oriented and patient cooperative  Airway and Oxygen Therapy: Patient Spontanous Breathing  Post-op Pain: none  Post-op Assessment: Post-op Vital signs reviewed, Patient's Cardiovascular Status Stable, Respiratory Function Stable, Patent Airway, No signs of Nausea or vomiting and Pain level controlled  Post-op Vital Signs: Reviewed and stable  Complications: No apparent anesthesia complications

## 2012-05-26 NOTE — Anesthesia Procedure Notes (Signed)
Procedure Name: Intubation Date/Time: 05/26/2012 7:55 AM Performed by: Sherie Don Pre-anesthesia Checklist: Patient identified, Emergency Drugs available, Suction available, Patient being monitored and Timeout performed Patient Re-evaluated:Patient Re-evaluated prior to inductionOxygen Delivery Method: Circle system utilized Preoxygenation: Pre-oxygenation with 100% oxygen Intubation Type: Rapid sequence, Cricoid Pressure applied and IV induction Laryngoscope Size: Mac and 4 Grade View: Grade I Tube size: 7.5 mm Number of attempts: 1 Airway Equipment and Method: Stylet Placement Confirmation: positive ETCO2 and ETT inserted through vocal cords under direct vision Secured at: 22 cm Tube secured with: Tape Dental Injury: Teeth and Oropharynx as per pre-operative assessment

## 2012-05-27 ENCOUNTER — Encounter (HOSPITAL_COMMUNITY): Payer: Self-pay | Admitting: Orthopedic Surgery

## 2012-05-27 LAB — CBC
HCT: 31.3 % — ABNORMAL LOW (ref 39.0–52.0)
Hemoglobin: 10.3 g/dL — ABNORMAL LOW (ref 13.0–17.0)
MCHC: 32.9 g/dL (ref 30.0–36.0)
WBC: 5.4 10*3/uL (ref 4.0–10.5)

## 2012-05-27 NOTE — Clinical Social Work Placement (Addendum)
Clinical Social Work Department  CLINICAL SOCIAL WORK PLACEMENT NOTE  05/27/2012  Patient: SIDDARTH HSIUNG Account Number: [id number] Admit date: 05/26/12  Clinical Social Worker: Sabino Niemann MSWDate/time: 05/27/2012 11:30 AM  Clinical Social Work is seeking post-discharge placement for this patient at the following level of care: SKILLED NURSING (*CSW will update this form in Epic as items are completed)  05/27/2012 Patient/family provided with [facility] Department of Clinical Social Work's list of facilities offering this level of care within the geographic area requested by the patient (or if unable, by the patient's family).  05/27/2012 Patient/family informed of their freedom to choose among providers that offer the needed level of care, that participate in Medicare, Medicaid or managed care program needed by the patient, have an available bed and are willing to accept the patient.  5/23/2014Patient/family informed of [facility]' ownership interest in [facility], as well as of the fact that they are under no obligation to receive care at this facility.  PASARR submitted to EDS on Pre-existing  PASARR number received from EDS on  FL2 transmitted to all facilities in geographic area requested by pt/family on 05/27/2012  FL2 transmitted to all facilities within larger geographic area on  Patient informed that his/her managed care company has contracts with or will negotiate with certain facilities, including the following:  Patient/family informed of bed offers received: 05/31/12 Dorma Russell) Patient chooses bed at [facility]) Physician recommends and patient chooses bed at SNF Patient to be transferred to on 05/31/12 ([facility]) Patient to be transferred to facility by [facility] The following physician request were entered in Epic:  Additional Comments:

## 2012-05-27 NOTE — Evaluation (Signed)
Physical Therapy Evaluation Patient Details Name: Chris [name] MRN: [id number] DOB: 1940-12-13 Today's Date: 05/27/2012 Time: 8657-8469 PT Time Calculation (min): 26 min  PT Assessment / Plan / Recommendation Clinical Impression  Pt is a 72 y.o. male s/p I & D R hip wound. PLOF is unclear due to cognitive deficits. Pt presents with deficits in mobility, balance and decreased indpendence with gt and transfers. Pt is a fall risk due to his confusion and balance deficits. Will benefit from skilled PT to maximize functional mobility. Recommend SNF upon D/C at this time.     PT Assessment  Patient needs continued PT services    Follow Up Recommendations  SNF;Supervision/Assistance - 24 hour;Supervision for mobility/OOB    Does the patient have the potential to tolerate intense rehabilitation      Barriers to Discharge        Equipment Recommendations  None recommended by PT    Recommendations for Other Services     Frequency Min 3X/week    Precautions / Restrictions Precautions Precautions: Fall Precaution Comments: per MD; pt has hx of falls; CONTACT precautions   Restrictions Weight Bearing Restrictions: Yes RLE Weight Bearing: Weight bearing as tolerated   Pertinent Vitals/Pain Unable to rate pain; repeated that his R hip was still "sore but Doctor Montez Morita is going to fix it" pt repositioned in chair for comfort with R LE elevated.       Mobility  Bed Mobility Bed Mobility: Supine to Sit;Sitting - Scoot to Edge of Bed Supine to Sit: 3: Mod assist;HOB elevated;With rails Sitting - Scoot to Edge of Bed: 4: Min assist Details for Bed Mobility Assistance: used mobility pad to assist pt to upright sitting position; pt grimaced in pain with movement of R LE; unable to folllow one step commands consistently to sequence task  Transfers Transfers: Sit to Stand;Stand to Sit;Stand Pivot Transfers Sit to Stand: 3: Mod assist;From elevated surface;From bed Stand to Sit: 3: Mod  assist;To chair/3-in-1;With armrests;With upper extremity assist Stand Pivot Transfers: 3: Mod assist Details for Transfer Assistance: requires constant multimodal cues for sequencing and hand placement; pt demo decreased safety awareness with transfer; is hesitant and reisistant to using AD and having assistance from PT; pt transferred with fwd flex trunk; vc's for upright posture consistently; pivot to L side for best results due to pain in R hip  Ambulation/Gait Ambulation/Gait Assistance: Not tested (comment) Stairs: No Wheelchair Mobility Wheelchair Mobility: No    Exercises Total Joint Exercises Ankle Circles/Pumps: AROM;AAROM;Both;10 reps;Supine (requires tactile cues to complete exercise)   PT Diagnosis: Difficulty walking;Acute pain  PT Problem List: Decreased strength;Decreased balance;Decreased knowledge of use of DME;Decreased safety awareness;Pain;Decreased mobility;Decreased activity tolerance PT Treatment Interventions: DME instruction;Gait training;Functional mobility training;Therapeutic exercise;Therapeutic activities;Balance training;Neuromuscular re-education;Patient/family education   PT Goals Acute Rehab PT Goals PT Goal Formulation: With patient Time For Goal Achievement: 06/03/12 Potential to Achieve Goals: Good Pt will go Supine/Side to Sit: with modified independence PT Goal: Supine/Side to Sit - Progress: Goal set today Pt will go Sit to Supine/Side: with modified independence PT Goal: Sit to Supine/Side - Progress: Goal set today Pt will go Sit to Stand: with modified independence PT Goal: Sit to Stand - Progress: Goal set today Pt will Transfer Bed to Chair/Chair to Bed: with supervision PT Transfer Goal: Bed to Chair/Chair to Bed - Progress: Goal set today Pt will Ambulate: >150 feet;with supervision;with rolling walker PT Goal: Ambulate - Progress: Goal set today  Visit Information  Last PT Received On: 05/27/12  Assistance Needed: +2 (for safety if  amb)    Subjective Data  Subjective: Pt appeared very confused and disoriented repeatedly saying "I was supposed to leave here last night but nobody told me to leave" Patient Stated Goal: home now   Prior Functioning  Home Living Lives With: Family Available Help at Discharge: Skilled Nursing Facility (Per MD ) Type of Home: House Prior Function Level of Independence: Independent with assistive device(s) (unclear due to cognitive deficits) Driving: No Vocation: Retired Musician: No difficulties Dominant Hand: Left    Cognition  Cognition Arousal/Alertness: Awake/alert Behavior During Therapy: WFL for tasks assessed/performed Overall Cognitive Status: No family/caregiver present to determine baseline cognitive functioning Area of Impairment: Orientation;Attention;Memory;Safety/judgement Orientation Level: Place;Time Current Attention Level: Selective Memory: Decreased short-term memory Following Commands: Follows one step commands inconsistently Safety/Judgement: Decreased awareness of deficits;Decreased awareness of safety General Comments: Pt attempted to stand without assistance or RW; denies need for RW; requires max encouragement to be assisted    Extremity/Trunk Assessment Right Lower Extremity Assessment RLE ROM/Strength/Tone: Due to impaired cognition;Unable to fully assess (able to perform LAQ; grossly 3/5 at a minimum ) RLE Sensation: WFL - Light Touch Left Lower Extremity Assessment LLE ROM/Strength/Tone: Unable to fully assess;Due to impaired cognition (able to perform LAQ; at minmimum 3/5) LLE Sensation: [facility] - Light Touch Trunk Assessment Trunk Assessment: Kyphotic   Balance Balance Balance Assessed: Yes Static Standing Balance Static Standing - Balance Support: Bilateral upper extremity supported;During functional activity Static Standing - Level of Assistance: 3: Mod assist  End of Session PT - End of Session Equipment Utilized During  Treatment: Gait belt Activity Tolerance: Patient limited by pain Patient left: in chair;with call bell/phone within reach;with chair alarm set Nurse Communication: Mobility status  GP     Donell Sievert, [place] [phone] 05/27/2012, 11:25 AM

## 2012-05-27 NOTE — Clinical Social Work Psychosocial (Signed)
Clinical Social Work Department  BRIEF PSYCHOSOCIAL ASSESSMENT  Patient: Chris [name]  Account Number: [id number]  Admit date: 05/26/12 Clinical Social Worker Sabino Niemann, MSW Date/Time: 05/27/2012 Referred by: Physician Date Referred: 05/27/2012 Referred for   SNF Placement   Other Referral:  Interview type: Patient  Other interview type: PSYCHOSOCIAL DATA  Living Status: Family Admitted from facility:  Level of care:  Primary support name: Winrow,Joyce Primary support relationship to patient: Sister Degree of support available:  Fair  CURRENT CONCERNS  Current Concerns   Post-Acute Placement   Other Concerns:  SOCIAL WORK ASSESSMENT / PLAN  CSW met with pt re: PT recommendation for SNF.   Pt lives with with family- Patient reported adamantly to SW that he does not want to go to SNF. Patient reported that he is anxious to leave the hospital now. SW encouraged patient to talk with his physician. SW faxed out patient for placement if he changes his mind.   CSW explained placement process and answered questions.     CSW completed FL2 and initiated SNF search.     Assessment/plan status: Information/Referral to [facility]  Other assessment/ plan:  Information/referral to community resources:  SNF     PATIENT'S/FAMILY'S RESPONSE TO PLAN OF CARE:  Pt  reports he wants to go home and has no intention to go to SNF. SW faxed patient's information out just incase he changes his mind.  Sabino Niemann, MSW [phone]

## 2012-05-27 NOTE — Care Management Note (Signed)
CARE MANAGEMENT NOTE 05/27/2012  Patient:  Chris [name], Chris [name]   Account Number:  [id number]  Date Initiated:  05/27/2012  Documentation initiated by:  Vance Peper  Subjective/Objective Assessment:   72 yr old male s/p I& D of right hip wound     Action/Plan:   Patient will need shortterm rehab at [facility]. Social worker is aware.   Anticipated DC Date:  05/30/2012   Anticipated DC Plan:  SKILLED NURSING FACILITY  In-house referral  Clinical Social Worker         Choice offered to / List presented to:             Status of service:  Completed, signed off Medicare Important Message given?   (If response is "NO", the following Medicare IM given date fields will be blank) Date Medicare IM given:   Date Additional Medicare IM given:    Discharge Disposition:  SKILLED NURSING FACILITY  Per UR Regulation:    If discussed at Long Length of Stay Meetings, dates discussed:    Comments:

## 2012-05-27 NOTE — Progress Notes (Signed)
Paged Dr Montez Morita to make him aware pt pulled out IV.

## 2012-05-27 NOTE — Progress Notes (Signed)
Subjective: 1 Day Post-Op Procedure(s) (LRB): IRRIGATION AND DEBRIDEMENT HIP and closure of wound (Right) Patient reports pain as 5 on 0-10 scale.    Objective: Vital signs in last 24 hours: Temp:  [97.1 F (36.2 C)-98 F (36.7 C)] 97.1 F (36.2 C) (05/23 0709) Pulse Rate:  [56-96] 56 (05/23 0709) Resp:  [5-23] 20 (05/23 0709) BP: (114-145)/(56-67) 145/67 mmHg (05/23 0709) SpO2:  [77 %-100 %] 99 % (05/23 0709)  Intake/Output from previous day: 05/22 0701 - 05/23 0700 In: 1200 [I.V.:1200] Out: -  Intake/Output this shift:     Recent Labs  05/25/12 1441 05/26/12 1557 05/27/12 0520  HGB 10.6* 10.9* 10.3*    Recent Labs  05/26/12 1557 05/27/12 0520  WBC 5.9 5.4  RBC 4.02* 3.78*  HCT 33.3* 31.3*  PLT 255 201    Recent Labs  05/25/12 1441 05/26/12 1557  NA 140  --   K 4.2  --   CL 104  --   CO2 25  --   BUN 10  --   CREATININE 0.81 0.81  GLUCOSE 78  --   CALCIUM 9.1  --    No results found for this basename: LABPT, INR,  in the last 72 hours  Dorsiflexion/Plantar flexion intact patient pulled drain from right hip, dressing is dry.  Assessment/Plan: 1 Day Post-Op Procedure(s) (LRB): IRRIGATION AND DEBRIDEMENT HIP and closure of wound (Right) Advance diet Up with therapy Plan for discharge tomorrow Discharge to SNF  Chris [name] F 05/27/2012, 8:53 AM

## 2012-05-28 LAB — WOUND CULTURE: Culture: NO GROWTH

## 2012-05-28 LAB — CBC
Hemoglobin: 10.6 g/dL — ABNORMAL LOW (ref 13.0–17.0)
MCH: 26.6 pg (ref 26.0–34.0)
MCV: 82 fL (ref 78.0–100.0)
Platelets: 254 10*3/uL (ref 150–400)
RBC: 3.99 MIL/uL — ABNORMAL LOW (ref 4.22–5.81)
WBC: 5.7 10*3/uL (ref 4.0–10.5)

## 2012-05-28 MED ORDER — HYDROCODONE-ACETAMINOPHEN 5-325 MG PO TABS: 1.0000 | ORAL_TABLET | Freq: Four times a day (QID) | ORAL | Status: DC | PRN

## 2012-05-28 NOTE — Discharge Summary (Signed)
Chris [name], Chris [name] NO.:  [id number]  MEDICAL RECORD NO.:  [id number]  LOCATION:  5N02C                        FACILITY:  [facility]  PHYSICIAN:  Myrtie Neither, MD      DATE OF BIRTH:  May 28, 1940  DATE OF ADMISSION:  05/26/2012 DATE OF DISCHARGE:  05/28/2012                              DISCHARGE SUMMARY   ADMITTING DIAGNOSES:  Open wound, right hip.  Infection, right hip.  The patient also has history of chronic obstructive pulmonary disease, congestive heart failure, prostate cancer, atrial fibrillation, iron deficiency anemia, aortic valve regurgitation, mitral valve regurgitation, dementia.  DISCHARGE DIAGNOSES:  Open wound, right hip.  Infection, right hip.  The patient also has history of chronic obstructive pulmonary disease, congestive heart failure, prostate cancer, atrial fibrillation, iron deficiency anemia, aortic valve regurgitation, mitral valve regurgitation, dementia.  COMPLICATIONS:  None.  Infection, right hip.  OPERATION:  Excision and debridement and wound closure, right hip.  PERTINENT HISTORY:  This is a 72 year old male who just recently discharged for an open wound to the right hip and underwent I and D and wound closure and on antibiotics.  The patient discharged home, being taken care by his sister.  The patient was found on the floor approximately 4 a.m. by the sister.  Shortly after discharge where he had fallen uncertain as to how long he had been on the floor, unable to get up and was found to have the wound of the right hip broken back open.  The patient was kept on wet-to-dry dressings and oral antibiotics and brought back in for debridement and closure and subsequent transfer to nursing home facility.  The patient's sister says that she is unable to handle him.  Pertinent physical exam of the right hip, wound with some serous fluid from the area, but bed itself was pink and granules.  Wound was approximately 2 inches in  length.  HOSPITAL COURSE:  The patient underwent CBC, UA, CMET.  The patient's lab was stable enough to undergo surgery.  The patient underwent excision, debridement, irrigation, wound closure with placement of Hemovac drain into the wound.  Postop course, the patient did pull his Hemovac out by the next morning, but remained afebrile.  No significant drainage from the area.  Wound was still intact.  The patient is afebrile.  H and H was stable.  Pain was under control with oral pain medication.  The patient is ambulatory and will require day to day dressing changes to the right hip.  The patient ambulates with both wound walker as well as a cane.  The patient does have dementia and has been difficult to handle with keeping off the right side as well as protect the knee area.  The patient is being discharged on Augmentin 500/125 mg 3 times a day, aspirin 325 mg daily, ferrous sulfate 225 mg b.i.d., Lasix 20 mg daily, Haldol 1 mg q.6 h. p.r.n. agitation, Norco 1-2 tablets q.6 p.r.n. for pain, Mobic 15 mg daily.  The patient is to return to the office in 1 week.  Physical therapy ambulation with use of walker and cane.  Daily dressing changes to the right hip.  The patient is  being discharged in stable and satisfactory condition.     Myrtie Neither, MD     AC/MEDQ  D:  05/28/2012  T:  05/28/2012  Job:  [id number]

## 2012-05-28 NOTE — Progress Notes (Signed)
Physical Therapy Treatment Patient Details Name: Chris [name] MRN: [id number] DOB: 1940-05-27 Today's Date: 05/28/2012 Time: 1205-1221 PT Time Calculation (min): 16 min  PT Assessment / Plan / Recommendation Comments on Treatment Session  Pt cont to demo decreased safety awareness. Is unsafe to amb without AD but demo decreased knowledge of using RW. Is a fall risk. Recommend SNF at this time for safest D/C plan.     Follow Up Recommendations  SNF;Supervision/Assistance - 24 hour;Supervision for mobility/OOB     Does the patient have the potential to tolerate intense rehabilitation     Barriers to Discharge        Equipment Recommendations  None recommended by PT    Recommendations for Other Services    Frequency Min 3X/week   Plan Discharge plan remains appropriate;Frequency remains appropriate    Precautions / Restrictions Precautions Precautions: Fall Precaution Comments: hx of falls Restrictions Weight Bearing Restrictions: Yes RLE Weight Bearing: Weight bearing as tolerated   Pertinent Vitals/Pain Unable to rate pain but repeatedly said his R hip had been "hurting for a few days"    Mobility  Bed Mobility Bed Mobility: Supine to Sit;Sitting - Scoot to Edge of Bed Supine to Sit: 4: Min assist;HOB elevated;With rails Sitting - Scoot to Edge of Bed: 4: Min assist Details for Bed Mobility Assistance: (A) to advance R LE to EOB; required constant cues and multimodal cues for hand placement and sequencing due to cognitive deficts; requries simple one step commands and tactile cues  Transfers Transfers: Stand to Sit;Sit to Stand Sit to Stand: 3: Mod assist;From bed;With upper extremity assist Stand to Sit: 3: Mod assist;To bed Details for Transfer Assistance: pt attempted to stand before RW was in place; pt is imulsive and demo decreased safety awareness; requires constant multimodal cues for sequencing and safety; (A) needed due to pain in R hip and decreased balance   Ambulation/Gait Ambulation/Gait Assistance: Other (comment);3: Mod assist (have 2 people for safety ) Ambulation Distance (Feet): 70 Feet Assistive device: Rolling walker Ambulation/Gait Assistance Details: pt will leave RW and attempt amb without AD; demo decreased safety awareness with AD; tends to pick up RW with turns; requires constant assistance to [facility] and advance RW safely; unable to stand fully upright with gt due to kyphosis  Gait Pattern: Step-through pattern;Narrow base of support;Trunk flexed;Shuffle Gait velocity: decreased Stairs: No Wheelchair Mobility Wheelchair Mobility: No    Exercises     PT Diagnosis:    PT Problem List:   PT Treatment Interventions:     PT Goals Acute Rehab PT Goals PT Goal Formulation: With patient Time For Goal Achievement: 06/03/12 Pt will go Supine/Side to Sit: with modified independence PT Goal: Supine/Side to Sit - Progress: Progressing toward goal PT Goal: Sit to Stand - Progress: Progressing toward goal PT Transfer Goal: Bed to Chair/Chair to Bed - Progress: Progressing toward goal PT Goal: Ambulate - Progress: Progressing toward goal  Visit Information  Last PT Received On: 05/28/12 Assistance Needed: +2    Subjective Data  Subjective: "im supposed to be going home today"; agreeable to a walk "with the dog" Patient Stated Goal: home today   Cognition  Cognition Arousal/Alertness: Awake/alert Behavior During Therapy: Impulsive Overall Cognitive Status: No family/caregiver present to determine baseline cognitive functioning Area of Impairment: Orientation Orientation Level: Place;Situation Current Attention Level: Selective Following Commands: Follows one step commands consistently Safety/Judgement: Decreased awareness of safety    Balance  Balance Balance Assessed: No  End of Session PT - End  of Session Equipment Utilized During Treatment: Gait belt Activity Tolerance: Patient tolerated treatment well Patient  left: in bed;with call bell/phone within reach;with bed alarm set Nurse Communication: Mobility status   GP     Donell Sievert, [place] [phone] 05/28/2012, 1:43 PM

## 2012-05-28 NOTE — Progress Notes (Signed)
CSW contacted by nursing to assist with d/c today to SNF. CSW note from 5/23 indicates pt is refusing placement. Pt has not had 3 day qualifying stay, this admission, to use medicare for SNF placement. Pt's last hospitalization was 04/28/12 -05/03/12. Pt may be able to have medicare coverage fo SNF under this past hospitalization. CSW will investigate this, speak with pt/family and assist with d/c planning, if possible, to SNF.  Cori Razor LCSW [phone]

## 2012-05-28 NOTE — Progress Notes (Signed)
Md made aware of the pt pulling out multiple IV's, and  Dislodging his hemo vac, Pt taking po meds well. Order given to leave IV and hemo vac out will monitor.

## 2012-05-29 LAB — CBC
Hemoglobin: 10.4 g/dL — ABNORMAL LOW (ref 13.0–17.0)
MCH: 27.3 pg (ref 26.0–34.0)
Platelets: 252 10*3/uL (ref 150–400)
RBC: 3.81 MIL/uL — ABNORMAL LOW (ref 4.22–5.81)
WBC: 5.1 10*3/uL (ref 4.0–10.5)

## 2012-05-29 NOTE — Progress Notes (Signed)
Subjective: 3 Days Post-Op Procedure(s) (LRB): IRRIGATION AND DEBRIDEMENT HIP and closure of wound (Right) Patient reports pain as 2 on 0-10 scale.    Objective: Vital signs in last 24 hours: Temp:  [97.4 F (36.3 C)-98.4 F (36.9 C)] 97.6 F (36.4 C) (05/25 2052) Pulse Rate:  [79-90] 90 (05/25 2052) Resp:  [15-18] 18 (05/25 2052) BP: (127-143)/(57-67) 127/67 mmHg (05/25 2052) SpO2:  [99 %-100 %] 100 % (05/25 2052)  Intake/Output from previous day: 05/24 0701 - 05/25 0700 In: 840 [P.O.:840] Out: -  Intake/Output this shift: Total I/O In: 240 [P.O.:240] Out: 0    Recent Labs  05/27/12 0520 05/28/12 0544 05/29/12 0520  HGB 10.3* 10.6* 10.4*    Recent Labs  05/28/12 0544 05/29/12 0520  WBC 5.7 5.1  RBC 3.99* 3.81*  HCT 32.7* 30.9*  PLT 254 252   No results found for this basename: NA, K, CL, CO2, BUN, CREATININE, GLUCOSE, CALCIUM,  in the last 72 hours No results found for this basename: LABPT, INR,  in the last 72 hours  Neurologically intact Dorsiflexion/Plantar flexion intact  Assessment/Plan: 3 Days Post-Op Procedure(s) (LRB): IRRIGATION AND DEBRIDEMENT HIP and closure of wound (Right) Plan for discharge tomorrow Discharge to SNF  Dorothy Landgrebe F 05/29/2012, 9:19 PM

## 2012-05-29 NOTE — Progress Notes (Signed)
SNF bed offfers provided to patient and list left at bedside- attempted to reach sister by phone- no answer/ no voicemail. Reece Levy, MSW, [place] Maj[place] [phone]

## 2012-05-30 DIAGNOSIS — F0390 Unspecified dementia without behavioral disturbance: Secondary | ICD-10-CM

## 2012-05-30 NOTE — Consult Note (Signed)
Reason for Consult:Capacity Evaluation Referring Physician: [name] is an 72 y.o. male.  HPI: Patient was seen and chart reviewed. Psychiatric consultation was requested regarding the capacity evaluation for placement after treatment at [facility]. Patient has severe dementia,  multiple medical problems and unable to provide history of current or past health problems and treatment needs. Patient was unaware of his placement needs too. Patient has a limited cognition.    Mental status examination: Patient was found in his bed and was seen holding a pack of opened coffee creamer in his hand and unable to tell me what it is and why he is holding it on. Patient has some loose creamer on his chest. Patient is awake, but not alert or oriented to place, person and situation. Patient has a limited cognitive status do to severe dementia process. Patient has no concentration, orientation, object recognition, fund of knowledge, immediate or delayed memory. Patient is poor insight judgment and impulse control.  Past Medical History  Diagnosis Date  . Poor dentition   . COPD (chronic obstructive pulmonary disease)   . CHF (congestive heart failure)   . History of blood transfusion 08/19/2011    "first time"  . Prostate cancer 2011    seed implant  . Atrial flutter     h/o; successful cardioversion,TEE guided  . Iron deficiency anemia     HX BLOOD TX  . Aortic valve regurgitation     moderate AR by echo 2010  . Mitral valve regurgitation     moderate to severe by 2D, mod by TEE '10  . Dementia     Past Surgical History  Procedure Laterality Date  . 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 Esophagogastroduodenoscopy  08/21/2011    Procedure: ESOPHAGOGASTRODUODENOSCOPY (EGD);  Surgeon: Shirley Friar, MD;  Location[facility] ENDOSCOPY;  Service: Endoscopy;  Laterality: N/A;  . Colonoscopy  08/21/2011    Procedure: COLONOSCOPY;  Surgeon: Shirley Friar, MD;  Location[facility] ENDOSCOPY;  Service: Endoscopy;  Laterality: N/A;  . Joint replacement    . Irrigation and debridement knee  12/23/2011  . I&d extremity  12/24/2011    Procedure: IRRIGATION AND DEBRIDEMENT EXTREMITY;  Surgeon: Kennieth Rad, MD;  Location: [facility] OR;  Service: Orthopedics;  Laterality: Right;  POSSIBLE WOUND CLOSURE  . Incision and drainage of wound  12/26/2011    Procedure: IRRIGATION AND DEBRIDEMENT WOUND;  Surgeon: Kennieth Rad, MD;  Location: [facility] OR;  Service: Orthopedics;  Laterality: Right;  I&D of Wound Right Hip and Wound Closure  . Incision and drainage of wound Right 04/28/2012    Procedure: IRRIGATION AND DEBRIDEMENT WOUND RIGHT HIP POSSIBLE CLOSURE;  Surgeon: Kennieth Rad, MD;  Location: [facility] OR;  Service: Orthopedics;  Laterality: Right;  . Incision and drainage hip Right 04/30/2012    Procedure: IRRIGATION AND DEBRIDEMENT HIP;  Surgeon: Kennieth Rad, MD;  Location: [facility] OR;  Service: Orthopedics;  Laterality: Right;  . Incision and drainage hip Right 05/26/2012    Procedure: IRRIGATION AND DEBRIDEMENT HIP and closure of wound;  Surgeon: Kennieth Rad, MD;  Location: [facility] OR;  Service: Orthopedics;  Laterality: Right;    History reviewed. No pertinent family history.  Social History:  reports that he has been smoking Cigarettes.  He has a 57 pack-year smoking history. He has never used smokeless tobacco. He reports that he does not use illicit drugs. His alcohol history  is not on file.  Allergies:  Allergies  Allergen Reactions  . Penicillins Nausea And Vomiting    Medications: I have reviewed the patient's current medications.  Results for orders placed during the hospital encounter of 05/26/12 (from the past 48 hour(s))  CBC     Status: Abnormal   Collection Time    05/29/12  5:20 AM      Result Value Range   WBC 5.1  4.0 - 10.5 K/uL   RBC 3.81 (*) 4.22 - 5.81 MIL/uL   Hemoglobin 10.4 (*) 13.0 - 17.0 g/dL   HCT 16.1 (*) 09.6 - 04.5 %    MCV 81.1  78.0 - 100.0 fL   MCH 27.3  26.0 - 34.0 pg   MCHC 33.7  30.0 - 36.0 g/dL   RDW 40.9 (*) 81.1 - 91.4 %   Platelets 252  150 - 400 K/uL    No results found.  Positive for anorexia, anxiety, learning difficulty and dementia,severe  Blood pressure 119/56, pulse 93, temperature 97.9 F (36.6 C), temperature source Oral, resp. rate 18, SpO2 100.00%.   Assessment/Plan: 1. Case discussed with Dr. Montez Morita 2. Based on my evaluation patient has severe dementia and does not meet criteria for Capacity to make medical care decisions and living arrangements.  3. Patient benefit from skilled nursing facility.  4. Recommended obtaining POA, if it was not done.  5. Patient will be referred to the psychiatric social service regarding legal paperwork if needed.  6. Appreciate psych consult and may contact if further assistance needed.   Chris [name],Chris R. 05/30/2012, 1:24 PM

## 2012-05-30 NOTE — Progress Notes (Signed)
Physical Therapy Treatment Patient Details Name: Chris [name] MRN: [id number] DOB: 12/04/1940 Today's Date: 05/30/2012 Time: 4098-1191 PT Time Calculation (min): 19 min  PT Assessment / Plan / Recommendation Comments on Treatment Session  While pt still has gait deviations, impulsively stands, and decr activity tolerance, his overall level of physical assist needed with mobility is improving    Follow Up Recommendations  SNF;Supervision/Assistance - 24 hour;Supervision for mobility/OOB     Does the patient have the potential to tolerate intense rehabilitation     Barriers to Discharge        Equipment Recommendations  None recommended by PT    Recommendations for Other Services    Frequency Min 3X/week   Plan Discharge plan remains appropriate;Frequency remains appropriate    Precautions / Restrictions Precautions Precautions: Fall Precaution Comments: hx of falls Restrictions RLE Weight Bearing: Weight bearing as tolerated   Pertinent Vitals/Pain Reports no pain during session; this therapist noted some grimacing with Heel Slides    Mobility  Bed Mobility Bed Mobility: Supine to Sit;Sitting - Scoot to Edge of Bed Supine to Sit: 4: Min assist;HOB elevated;With rails Sitting - Scoot to Edge of Bed: 4: Min assist Details for Bed Mobility Assistance: (A) to advance R LE to EOB; required constant cues and multimodal cues for hand placement and sequencing due to cognitive deficts; requries simple one step commands and tactile cues  Transfers Transfers: Stand to Sit;Sit to Stand Sit to Stand: 4: Min assist;From bed;With upper extremity assist Stand to Sit: 3: Mod assist;To chair/3-in-1;With armrests Details for Transfer Assistance: Pt stands impulsivley without Assistive device in front of him; Tends to brace backs of LEs against bed for steadiness with posterior lean; Max cues for safety, hand placement, and to control descent with stand to  sit Ambulation/Gait Ambulation/Gait Assistance: 4: Min assist (second person to push chair behind for safety) Ambulation Distance (Feet): 70 Feet Assistive device: Rolling walker Ambulation/Gait Assistance Details: Cues throughout for RW advancement, proximity, more upright posture, and overall safety; Noted with fatigue, pt tends to drift into increasingly flexed posture Gait Pattern: Step-through pattern;Narrow base of support;Trunk flexed;Shuffle Gait velocity: decreased    Exercises Total Joint Exercises Heel Slides: AAROM;AROM;10 reps;Right;Supine   PT Diagnosis:    PT Problem List:   PT Treatment Interventions:     PT Goals Acute Rehab PT Goals Time For Goal Achievement: 06/03/12 Potential to Achieve Goals: Good Pt will go Supine/Side to Sit: with modified independence PT Goal: Supine/Side to Sit - Progress: Progressing toward goal Pt will go Sit to Stand: with modified independence PT Goal: Sit to Stand - Progress: Progressing toward goal Pt will Ambulate: >150 feet;with supervision;with rolling walker PT Goal: Ambulate - Progress: Progressing toward goal  Visit Information  Last PT Received On: 05/30/12 Assistance Needed: +1    Subjective Data  Subjective: Continuing to state he needs to go home; when asked about grocery shopping, etc, he replied, "oh, I'll get somebody" [to help]   Cognition  Cognition Arousal/Alertness: Awake/alert Behavior During Therapy: Impulsive Overall Cognitive Status: No family/caregiver present to determine baseline cognitive functioning Area of Impairment: Orientation Orientation Level: Disoriented to;Situation;Time;Place Current Attention Level: Selective Memory: Decreased short-term memory Following Commands: Follows one step commands consistently Safety/Judgement: Decreased awareness of safety General Comments: Pt attempted to stand without assistance or RW; denies need for RW; requires max encouragement to be assisted    Balance      End of Session PT - End of Session Equipment Utilized During Treatment: Gait  belt Activity Tolerance: Patient tolerated treatment well Patient left: in chair;with call bell/phone within reach;with chair alarm set;with family/visitor present Psychiatrist in room)   GP     Chris [name] [facility] [place], [place] [phone]  05/30/2012, 10:08 AM

## 2012-05-31 LAB — ANAEROBIC CULTURE

## 2012-05-31 NOTE — Progress Notes (Signed)
Subjective: 5 Days Post-Op Procedure(s) (LRB): IRRIGATION AND DEBRIDEMENT HIP and closure of wound (Right) Patient reports pain as 2 on 0-10 scale.    Objective: Vital signs in last 24 hours: Temp:  [97.9 F (36.6 C)-98.4 F (36.9 C)] 98.4 F (36.9 C) (05/26 2111) Pulse Rate:  [79-93] 79 (05/27 0602) Resp:  [18] 18 (05/27 0602) BP: (111-139)/(48-56) 111/48 mmHg (05/27 0602) SpO2:  [98 %-100 %] 100 % (05/27 0602)  Intake/Output from previous day: 05/26 0701 - 05/27 0700 In: 600 [P.O.:600] Out: 150 [Urine:150] Intake/Output this shift:     Recent Labs  05/29/12 0520  HGB 10.4*    Recent Labs  05/29/12 0520  WBC 5.1  RBC 3.81*  HCT 30.9*  PLT 252   No results found for this basename: NA, K, CL, CO2, BUN, CREATININE, GLUCOSE, CALCIUM,  in the last 72 hours No results found for this basename: LABPT, INR,  in the last 72 hours  Neurovascular intact Dorsiflexion/Plantar flexion intact  Assessment/Plan: 5 Days Post-Op Procedure(s) (LRB): IRRIGATION AND DEBRIDEMENT HIP and closure of wound (Right) Up with therapy Plan for discharge tomorrow Discharge to SNF  Petr Bontempo F 05/31/2012, 11:57 AM

## 2012-05-31 NOTE — Progress Notes (Signed)
Physical Therapy Treatment Patient Details Name: Chris [name] MRN: [id number] DOB: 09-13-1940 Today's Date: 05/31/2012 Time: 0865-7846 PT Time Calculation (min): 23 min  PT Assessment / Plan / Recommendation Comments on Treatment Session  While pt still has gait deviations, impulsively stands, and decr activity tolerance, his overall level of physical assist needed with mobility is improving    Follow Up Recommendations  SNF;Supervision/Assistance - 24 hour;Supervision for mobility/OOB     Does the patient have the potential to tolerate intense rehabilitation     Barriers to Discharge        Equipment Recommendations  None recommended by PT    Recommendations for Other Services    Frequency Min 3X/week   Plan Discharge plan remains appropriate;Frequency remains appropriate    Precautions / Restrictions Precautions Precautions: Fall Precaution Comments: hx of falls Restrictions RLE Weight Bearing: Weight bearing as tolerated   Pertinent Vitals/Pain Denies pain when asked    Mobility  Bed Mobility Bed Mobility: Supine to Sit;Sitting - Scoot to Edge of Bed Supine to Sit: 5: Supervision (Close supervision) Sitting - Scoot to Edge of Bed: 5: Supervision Details for Bed Mobility Assistance: Did not require physical assist to get to EOB; inefficient motion Transfers Transfers: Stand to Sit;Sit to Stand Sit to Stand: 5: Supervision;From bed Stand to Sit: 4: Min guard;To chair/3-in-1 Details for Transfer Assistance: Pt stands impulsivley without Assistive device in front of him; Tends to brace backs of LEs against bed for steadiness with posterior lean; Max cues for safety, hand placement, and to control descent with stand to sit Ambulation/Gait Ambulation/Gait Assistance: 4: Min guard (Without physical contact) Ambulation Distance (Feet): 100 Feet Assistive device: Rolling walker Ambulation/Gait Assistance Details: Pt lifted RW completely off the floor a few times during  amb; Cues for safety; continued flexed posture; Better ability to maneuver RW Gait Pattern: Step-through pattern;Narrow base of support;Trunk flexed;Shuffle Gait velocity: decreased    Exercises     PT Diagnosis:    PT Problem List:   PT Treatment Interventions:     PT Goals Acute Rehab PT Goals Time For Goal Achievement: 06/03/12 Potential to Achieve Goals: Good Pt will go Supine/Side to Sit: with modified independence PT Goal: Supine/Side to Sit - Progress: Progressing toward goal Pt will go Sit to Stand: with modified independence PT Goal: Sit to Stand - Progress: Progressing toward goal Pt will Transfer Bed to Chair/Chair to Bed: with supervision PT Transfer Goal: Bed to Chair/Chair to Bed - Progress: Progressing toward goal Pt will Ambulate: >150 feet;with supervision;with rolling walker PT Goal: Ambulate - Progress: Progressing toward goal  Visit Information  Last PT Received On: 05/31/12 Assistance Needed: +1    Subjective Data  Subjective: Initially not wanting to get up, but with gentle persuasion, he agreed to walk Patient Stated Goal: home today   Cognition  Cognition Arousal/Alertness: Awake/alert Behavior During Therapy: Impulsive Overall Cognitive Status: No family/caregiver present to determine baseline cognitive functioning Area of Impairment: Orientation Orientation Level: Disoriented to;Situation;Time;Place Current Attention Level: Selective Memory: Decreased short-term memory Following Commands: Follows one step commands consistently Safety/Judgement: Decreased awareness of safety General Comments: Pt attempted to stand without assistance or RW; denies need for RW; requires max encouragement to be assisted    Balance     End of Session PT - End of Session Equipment Utilized During Treatment: Gait belt Activity Tolerance: Patient tolerated treatment well Patient left: in chair;with call bell/phone within reach;with family/visitor present (sitter  preparing to give a bath)   GP  Van Clines [place], [place] [phone]  05/31/2012, 4:08 PM

## 2012-05-31 NOTE — Clinical Social Work Note (Signed)
Psych MD eval and recommendation that pt currently lacks capacity to make informed healthcare decisions noted. CSW spoke with pt's sister, [name], who has accepted bed offer from [facility] and reports pt's brother will complete ppw today. Pt to transfer to [facility] today via [facility]. D/C packet complete with signed Rx, chart copy, and signed FL2. CSW signing off as no other CSW needs identified at this time.  Dellie Burns, MSW, [place] [phone] (coverage)

## 2012-06-01 NOTE — Discharge Summary (Signed)
NAME:  Chris [name], Chris [name]                    ACCOUNT NO.:  MEDICAL RECORD NO.:  [id number]  LOCATION:                                 FACILITY:  PHYSICIAN:  Myrtie Neither, MD           DATE OF BIRTH:  DATE OF ADMISSION:  05/26/2012 DATE OF DISCHARGE:  05/31/2012                              DISCHARGE SUMMARY   ADMITTING DIAGNOSIS:  Open wound, right hip status post fall; infection, right hip.  DISCHARGE DIAGNOSIS:  Open wound, right hip status post fall; infection, right hip.  COMPLICATION:  None.  INFECTIONS:  Open wound, right hip.  OPERATIONS:  Excisional debridement and wound closure, right hip.  The patient had been planned on being discharged previously on May 29, 2012. The patient had initially refused to go for nursing home. Recommendation for nursing home was due to the fact that the patient lives alone and sister is having difficulty trying to care for him and on his last discharge, the patient was found at home, lying on the floor from a fall, which broke his wound to his right hip back open.  The patient did have psychiatric evaluation and was found to be stable without dementia.  The patient this morning does agree to go to nursing home for 2 week period to allow his right hip wound to heal.  The patient is scheduled to be discharged and planning on discharge to nursing home on Jun 01, 2012.  Medications remain the same.  Previous instructions remain the same.  The patient will be seen back in the office 1 week from today.  The patient is being again discharged in stable and satisfactory condition.     Myrtie Neither, MD     AC/MEDQ  D:  05/31/2012  T:  06/01/2012  Job:  [id number]

## 2012-06-03 ENCOUNTER — Non-Acute Institutional Stay (SKILLED_NURSING_FACILITY): Payer: Medicare Other | Admitting: Internal Medicine

## 2012-06-03 DIAGNOSIS — D509 Iron deficiency anemia, unspecified: Secondary | ICD-10-CM

## 2012-06-03 DIAGNOSIS — F03918 Unspecified dementia, unspecified severity, with other behavioral disturbance: Secondary | ICD-10-CM

## 2012-06-03 DIAGNOSIS — F0391 Unspecified dementia with behavioral disturbance: Secondary | ICD-10-CM

## 2012-06-03 DIAGNOSIS — T8189XD Other complications of procedures, not elsewhere classified, subsequent encounter: Secondary | ICD-10-CM

## 2012-06-03 DIAGNOSIS — Z5189 Encounter for other specified aftercare: Secondary | ICD-10-CM

## 2012-06-03 NOTE — Progress Notes (Signed)
Patient ID: Chris [name], male   DOB: [birth year], 72 y.o.   MRN: [id number] Chief complaint; status post admission for [place] 05/26/2012 through 05/28/2012. [facility].  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 It appear that this goes back to the fall of 2013. This may have started with a septic bursitis. I am not certain. He has a total hip replacement on that side, but from review of notes in the recent CT scan of that area. It is not felt to involve the hip itself, nor is there felt to be underlying osteomyelitis. Nevertheless, there is extensive underlying infection. He apparently was just recently discharged for an open wound to the right hip area and underwent I&D and closure. Cultures from that time. I think grew group B strep. Anaerobic cultures were negative. If there is additional culture results of relevance. I don't see them. On this occasion the patient was discharged home after his recent admission. He was found on the floor by his sister. He was found to have a wound on the right hip broken open. The patient I believe was taken back to the OR for excision debridement, irrigation, and wound closure with placement of a Hemovac drain. The next morning he pulled a Hemovac drain out. There was significant difficulties keeping this man from ambulating on the right side. Indeed, shortly after his arrival in this SNF. He has been transferred to the locked unit. He is discharged on Augmentin 500/125 3 times a day.   has a past medical history of Poor dentition; COPD (chronic obstructive pulmonary disease); CHF (congestive heart failure); History of blood transfusion (08/19/2011); Prostate cancer (2011); Atrial flutter; Iron deficiency anemia; Aortic valve regurgitation; Mitral valve regurgitation; and Dementia.  Past Surgical History  Procedure Laterality Date  . Total hip arthroplasty  ,right in 2000 left in  2002    bilateral  . Esophagogastroduodenoscopy  08/21/2011    Procedure: ESOPHAGOGASTRODUODENOSCOPY (EGD);  Surgeon: Shirley Friar, MD;  Location: [facility] ENDOSCOPY;  Service: Endoscopy;  Laterality: N/A;  . Colonoscopy  08/21/2011    Procedure: COLONOSCOPY;  Surgeon: Shirley Friar, MD;  Location: [facility] ENDOSCOPY;  Service: Endoscopy;  Laterality: N/A;  . Joint replacement    . Irrigation and debridement knee  12/23/2011  . I&d extremity  12/24/2011    Procedure: IRRIGATION AND DEBRIDEMENT EXTREMITY;  Surgeon: Kennieth Rad, MD;  Location: [facility] OR;  Service: Orthopedics;  Laterality: Right;  POSSIBLE WOUND CLOSURE  . Incision and drainage of wound  12/26/2011    Procedure: IRRIGATION AND DEBRIDEMENT WOUND;  Surgeon: Kennieth Rad, MD;  Location: [facility] OR;  Service: Orthopedics;  Laterality: Right;  I&D of Wound Right Hip and Wound Closure  . Incision and drainage of wound Right 04/28/2012    Procedure: IRRIGATION AND DEBRIDEMENT WOUND RIGHT HIP POSSIBLE CLOSURE;  Surgeon: Kennieth Rad, MD;  Location: [facility] OR;  Service: Orthopedics;  Laterality: Right;  . Incision and drainage hip Right 04/30/2012    Procedure: IRRIGATION AND DEBRIDEMENT HIP;  Surgeon: Kennieth Rad, MD;  Location: [facility] OR;  Service: Orthopedics;  Laterality: Right;  . Incision and drainage hip Right 05/26/2012    Procedure: IRRIGATION AND DEBRIDEMENT HIP and closure of wound;  Surgeon: Kennieth Rad, MD;  Location: [facility] OR;  Service: Orthopedics;  Laterality: Right;  Medications; Augmentin, 500/125 by mouth 3 times a day x14 days, ASA 325 by mouth daily, ferrous sulfate 325 by mouth twice a day,  Lasix, 20 mg by mouth daily, Mobic 15 mg by mouth daily, Norco when necessary, Haldol 1 mg by mouth every 6 hours when necessary agitation.  Social History  Substance Use Topics  . Smoking status: Current Every Day Smoker -- 1.00 packs/day for 57  years    Types: Cigarettes  . Smokeless tobacco: Never Used  Review of systems; really not possible due to dementia.  On examination; HEENT no oral lesions are seen. Respiratory clear entry bilaterally. CardiacHS normal. Did not hear Diastolic murmer.  Abdominal: no liver no spleen no tenderness.  Gu; Bladder is not enlarged, no CVA tenderness MSH; incision over the right hip with draining sinus over the inferior recess. Sero sanguenous drainage. C+S done. CNS; Severe dementia. Somewhat parkinsonian looking gate but no other features.   Impression/plan #1 infection in the right hip area, not felt to involve the underlying prosthetic joint. Most recent cultures Show group B strep , which should be covered by augmentin. The draining sinus in the inferior recess of the incision is probably where the Hemovac drain was This probes 6 inches superiorly. All attempts at packed this with silver alginate. I have my doubts that this is going to completely resolve. As recent CT scan did not suggest involvement of the right hip prostheses or osteomyelitis. 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 I am not in favor of using Haldol when necessary for agitation-related side effects and dementia.  #3 history of iron deficiency anemia, believe at one point was seen by hematology. He is on iron. All follow this serially.  #4 gait ataxia recent fall as described in history of present illness.  I will see if the, culture I've done of the purulent looking drainage from the sinus aides that antibiotic choices. The original incision remains closed. [Dr. Merton Border Carter,].

## 2012-06-07 ENCOUNTER — Non-Acute Institutional Stay (SKILLED_NURSING_FACILITY): Payer: Medicare Other | Admitting: Internal Medicine

## 2012-06-07 DIAGNOSIS — Z5189 Encounter for other specified aftercare: Secondary | ICD-10-CM

## 2012-06-07 DIAGNOSIS — L02419 Cutaneous abscess of limb, unspecified: Secondary | ICD-10-CM

## 2012-06-07 DIAGNOSIS — T8189XD Other complications of procedures, not elsewhere classified, subsequent encounter: Secondary | ICD-10-CM

## 2012-06-14 ENCOUNTER — Non-Acute Institutional Stay (SKILLED_NURSING_FACILITY): Payer: Medicare Other | Admitting: Internal Medicine

## 2012-06-14 DIAGNOSIS — T8189XD Other complications of procedures, not elsewhere classified, subsequent encounter: Secondary | ICD-10-CM

## 2012-06-14 DIAGNOSIS — Z5189 Encounter for other specified aftercare: Secondary | ICD-10-CM

## 2012-06-14 NOTE — Progress Notes (Shared)
Patient ID: Chris [name], male   DOB: [birth year], 72 y.o.   MRN: [id number]           PROGRESS NOTE  DATE:  06/07/2012  FACILITY: [facility]   LEVEL OF CARE:   SNF   Acute Visit   CHIEF COMPLAINT:  Follow up right hip area.    HISTORY OF PRESENT ILLNESS:  Mr. Follette is a gentleman who has had recurrent problems with infection in a right hip surgical site.  He has a total hip replacement, although the infection is not felt to involve this.  After his most recent surgery, he was left with a surgical drain which he removed.  This has left him with a draining sinus here which I packed with silver alginate, which is being changed daily.  He is not  currently on antibiotics.  I am awaiting the results of the culture.      PHYSICAL EXAMINATION:   SKIN:  INSPECTION:  Right hip:  The area is still sutured.  There is a small, draining sinus about the size of a little finger.   Palpation around a large area of this site results in a serosanguineous discharge.  It is not clearly painful, however.    ASSESSMENT/PLAN:  Infected surgical site at the site of a previous right total hip replacement (please see my previous note on this).  We are awaiting a culture result here before considering antibiotics.  He follows with Orthopedic Surgery.  CPT CODE: 59563  ADDENDUM:  Surprisingly, the culture result here was negative.  Nevertheless, I am going to put him on a 14-day course of Augmentin.  We will continue the silver alginate based packing/dressing.

## 2012-06-15 ENCOUNTER — Non-Acute Institutional Stay (SKILLED_NURSING_FACILITY): Payer: Medicare Other | Admitting: Internal Medicine

## 2012-06-15 DIAGNOSIS — D638 Anemia in other chronic diseases classified elsewhere: Secondary | ICD-10-CM

## 2012-06-15 DIAGNOSIS — D473 Essential (hemorrhagic) thrombocythemia: Secondary | ICD-10-CM

## 2012-06-21 NOTE — Progress Notes (Signed)
Patient ID: Chris [name], male   DOB: 10-10-1940, 72 y.o.   MRN: [id number]           PROGRESS NOTE  DATE:  06/14/2012  FACILITY: [facility]   LEVEL OF CARE:   SNF   Acute Visit    CHIEF COMPLAINT:  Follow up right hip.    HISTORY OF PRESENT ILLNESS:  Chris [name] is a gentleman who came in after I&D of a right hip wound closure.    After his surgery, he had a drain placed that the patient promptly removed.  He came here with a draining sinus, I think at the site of his previous drain.  He had a serosanguineous discharge when he first came in here.   Surprisingly, this did not culture.  We have been packing this with silver alginate rope, covered with an ABD.    PHYSICAL EXAMINATION:   SKIN:  INSPECTION:  Right hip:  The sinus drainage site is much smaller and there is less drainage and bogginess around the surgical margin.  I am not sure if this is because of the Augmentin or in spite of it.  However, the area looks better.   ASSESSMENT/PLAN:  Infection, status post I&D of right hip cellulitis.  This looks considerably better.  Unfortunately, the patient will not leave the dressing intact.  However, in spite of this difficulty, he has improved.  I was a bit surprised that the culture was negative.  That is why I started him on Augmentin (anaerobic coverage, among others).   I will allow this to complete its course.  Right now, the tiny open area can be dressed with silver alginate.   CPT CODE: 78295

## 2012-06-22 ENCOUNTER — Non-Acute Institutional Stay: Payer: Medicare Other | Admitting: Internal Medicine

## 2012-06-22 DIAGNOSIS — D509 Iron deficiency anemia, unspecified: Secondary | ICD-10-CM

## 2012-06-22 DIAGNOSIS — I509 Heart failure, unspecified: Secondary | ICD-10-CM

## 2012-06-22 DIAGNOSIS — F0390 Unspecified dementia without behavioral disturbance: Secondary | ICD-10-CM

## 2012-06-22 DIAGNOSIS — I4891 Unspecified atrial fibrillation: Secondary | ICD-10-CM

## 2012-07-05 DIAGNOSIS — D473 Essential (hemorrhagic) thrombocythemia: Secondary | ICD-10-CM | POA: Insufficient documentation

## 2012-07-05 DIAGNOSIS — D638 Anemia in other chronic diseases classified elsewhere: Secondary | ICD-10-CM | POA: Insufficient documentation

## 2012-07-05 NOTE — Progress Notes (Signed)
Patient ID: Chris [name], male   DOB: 1940-07-06, 72 y.o.   MRN: [id number]        PROGRESS NOTE  DATE: 06/15/2012  FACILITY:  [facility]  LEVEL OF CARE: SNF (31)  Acute Visit  CHIEF COMPLAINT:  Manage anemia of chronic disease and thrombocytosis.    HISTORY OF PRESENT ILLNESS: I was requested by the staff to assess the patient regarding above problem(s):  ANEMIA: The anemia has been stable. The patient denies fatigue, melena or hematochezia. No complications from the medications currently being used.  On 06/09/2012, patient's hemoglobin was 10.4, MCV 82.  At hospital discharge, hemoglobin was 10.4.    THROMBOCYTOSIS:  New problem.  On 06/09/2012, platelet count was 418.  At hospital discharge, platelet count was 252.  Patient denies numbness or tingling.    PAST MEDICAL HISTORY : Reviewed.  No changes.  CURRENT MEDICATIONS: Reviewed per [facility]  REVIEW OF SYSTEMS:  Difficult to obtain due to dementia.   PHYSICAL EXAMINATION  GENERAL: no acute distress, normal body habitus NECK: supple, trachea midline, no neck masses, no thyroid tenderness, no thyromegaly RESPIRATORY: breathing is even & unlabored, BS CTAB CARDIAC: RRR, no murmur,no extra heart sounds EDEMA/VARICOSITIES:  +1 bilateral lower extremity edema  ARTERIAL:  pedal pulses diminished GI: abdomen soft, normal BS, no masses, no tenderness, no hepatomegaly, no splenomegaly PSYCHIATRIC: the patient is alert & oriented to person, affect & behavior appropriate  ASSESSMENT/PLAN:  Anemia of chronic disease.  Hemoglobin stable.  Continue iron.   Thrombocytosis.  New problem.  Likely acute phase reactant.  We will monitor.    CPT CODE: 78295

## 2012-07-14 NOTE — Progress Notes (Signed)
Patient ID: Chris [name], male   DOB: May 27, 1940, 72 y.o.   MRN: [id number]        PROGRESS NOTE  DATE: 06/22/2012   FACILITY: [facility]  LEVEL OF CARE: SNF (31)  Discharge Visit  CHIEF COMPLAINT:  Manage CHF.    HISTORY OF PRESENT ILLNESS: I was requested by the social worker to perform face-to-face evaluation for discharge:  Patient was admitted to this facility for short-term rehabilitation after the patient's recent hospitalization.  Patient has completed SNF rehabilitation and therapy has cleared the patient for discharge.  Reassessment of ongoing problem(s):  CHF:The staff does not relate significant weight changes, denies sob, DOE, orthopnea, PNDs, pedal edema, palpitations or chest pain.  CHF remains stable.  No complications form the medications being used.  Patient is a poor historian due to dementia.    PAST MEDICAL HISTORY : Reviewed.  No changes.  CURRENT MEDICATIONS: Reviewed per [facility]  REVIEW OF SYSTEMS:  Unobtainable due to dementia.    PHYSICAL EXAMINATION  VS:  T 98      P 72    RR 18     BP 110/64     POX %       WT (Lb) 150  GENERAL: no acute distress, normal body habitus NECK: supple, trachea midline, no neck masses, no thyroid tenderness, no thyromegaly RESPIRATORY: breathing is even & unlabored, BS CTAB CARDIAC: RRR, no murmur,no extra heart sounds, no edema GI: abdomen soft, normal BS, no masses, no tenderness, no hepatomegaly, no splenomegaly PSYCHIATRIC: the patient is alert, disoriented, affect & behavior appropriate  LABS/RADIOLOGY: 06/2012:  Hemoglobin 10.4, MCV 82, platelets 418, WBC 5.1.    CMP normal.    ASSESSMENT/PLAN:  CHF.  Well compensated.    Atrial fibrillation.  Rate controlled.    Dementia.  Advanced.   Iron deficiency anemia.  Continue supplementation.   COPD.  Well compensated.    I have filled out patient's discharge paperwork and written prescriptions.  Patient will receive home health ST and  nursing. DME provided:  None.   Total discharge time: Less than 30 minutes Discharge time involved coordination of the discharge process with Child psychotherapist, nursing staff and therapy department. Medical justification for home health services/DME verified.  CPT CODE: 40981

## 2012-08-16 ENCOUNTER — Other Ambulatory Visit: Payer: Self-pay | Admitting: Geriatric Medicine

## 2012-08-16 ENCOUNTER — Other Ambulatory Visit: Payer: Self-pay | Admitting: Orthopedic Surgery

## 2012-08-16 ENCOUNTER — Ambulatory Visit
Admission: RE | Admit: 2012-08-16 | Discharge: 2012-08-16 | Disposition: A | Discharge status: Home / Self Care | Payer: Medicare Other | Source: Ambulatory Visit | Attending: Orthopedic Surgery | Admitting: Orthopedic Surgery

## 2012-08-16 DIAGNOSIS — R52 Pain, unspecified: Secondary | ICD-10-CM

## 2013-02-08 ENCOUNTER — Ambulatory Visit
Admission: RE | Admit: 2013-02-08 | Discharge: 2013-02-08 | Disposition: A | Discharge status: Home / Self Care | Payer: Medicare Other | Source: Ambulatory Visit | Attending: Orthopedic Surgery | Admitting: Orthopedic Surgery

## 2013-02-08 ENCOUNTER — Other Ambulatory Visit: Payer: Self-pay | Admitting: Orthopedic Surgery

## 2013-02-08 DIAGNOSIS — M25551 Pain in right hip: Secondary | ICD-10-CM

## 2013-05-01 IMAGING — CR DG HIP (WITH OR WITHOUT PELVIS) 2-3V*L*
2 series · 2 of 2 positions shown · non-contrast
Comparison: 11/12/2005

CLINICAL DATA: Follow up left hip arthroplasty.

LEFT HIP - COMPLETE 2+ VIEW

[t hip ap left]
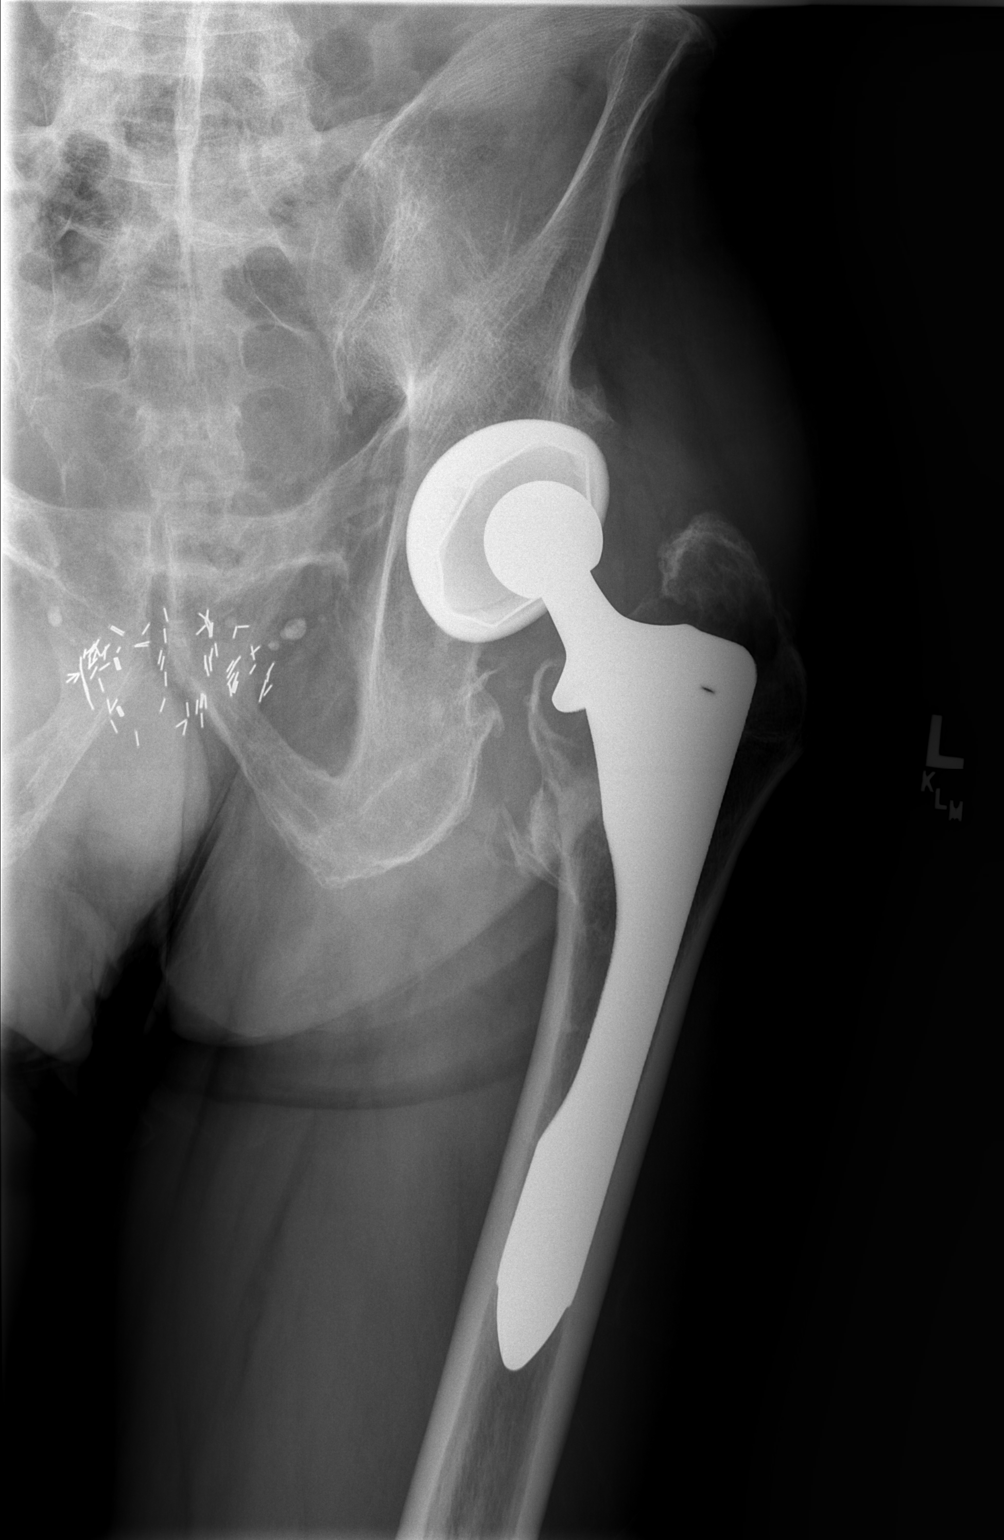

[t hip frog leg left]
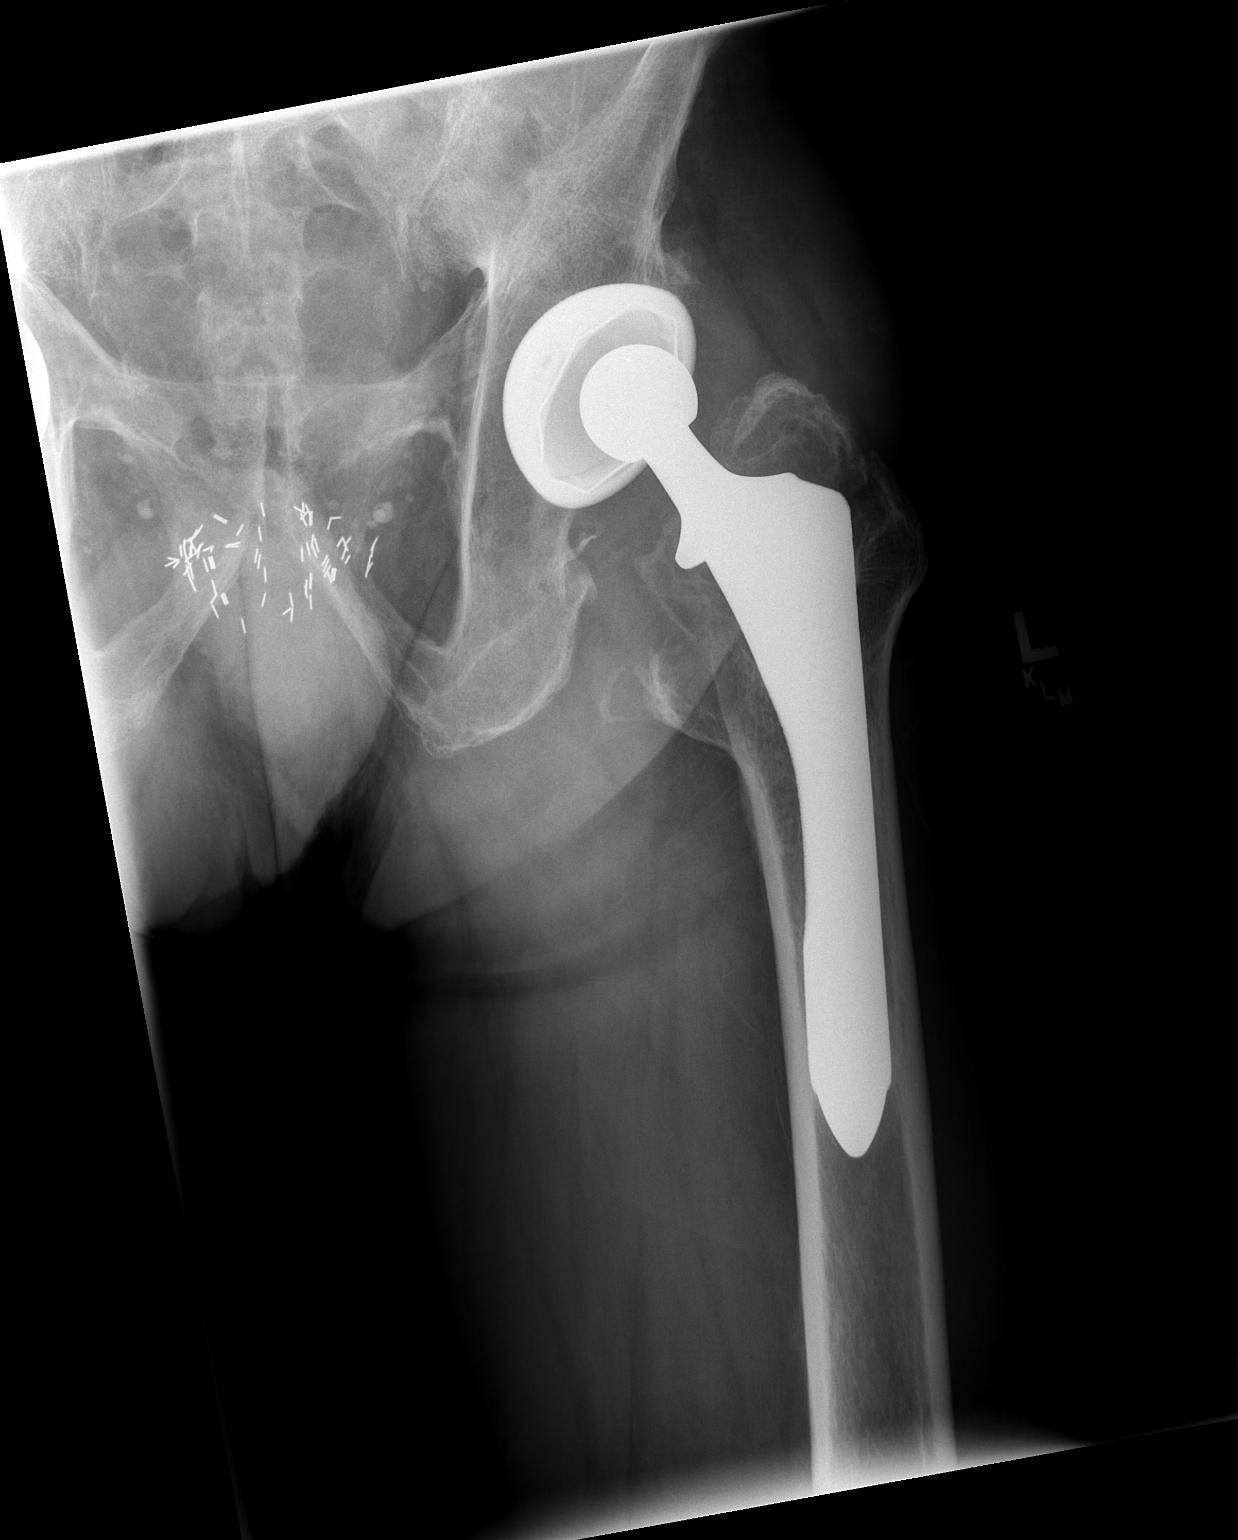

[2 of 2 positions shown; findings below may reference images not displayed]

FINDINGS: There is no fracture.  The left hip prosthetic components
are well-aligned.  There is no radiographic evidence of prosthetic
loosening.

Multiple radiation therapy seeds have been placed in the region of
the prostate since the prior exam.  The soft tissues are otherwise
unremarkable.  The bones are demineralized.
IMPRESSION: No radiographic evidence of prosthetic loosening.  Prosthesis is
normally aligned.

## 2013-06-10 ENCOUNTER — Emergency Department (HOSPITAL_COMMUNITY): Payer: Medicare Other

## 2013-06-10 ENCOUNTER — Emergency Department (HOSPITAL_COMMUNITY)
Admission: EM | Admit: 2013-06-10 | Discharge: 2013-06-10 | Disposition: A | Discharge status: Home / Self Care | Payer: Medicare Other | Attending: Emergency Medicine | Admitting: Emergency Medicine

## 2013-06-10 ENCOUNTER — Encounter (HOSPITAL_COMMUNITY): Payer: Self-pay | Admitting: Emergency Medicine

## 2013-06-10 DIAGNOSIS — D509 Iron deficiency anemia, unspecified: Secondary | ICD-10-CM | POA: Insufficient documentation

## 2013-06-10 DIAGNOSIS — R5383 Other fatigue: Principal | ICD-10-CM

## 2013-06-10 DIAGNOSIS — Z8719 Personal history of other diseases of the digestive system: Secondary | ICD-10-CM | POA: Insufficient documentation

## 2013-06-10 DIAGNOSIS — R5381 Other malaise: Secondary | ICD-10-CM | POA: Insufficient documentation

## 2013-06-10 DIAGNOSIS — Z880 Allergy status to penicillin: Secondary | ICD-10-CM | POA: Insufficient documentation

## 2013-06-10 DIAGNOSIS — F17200 Nicotine dependence, unspecified, uncomplicated: Secondary | ICD-10-CM | POA: Insufficient documentation

## 2013-06-10 DIAGNOSIS — F0390 Unspecified dementia without behavioral disturbance: Secondary | ICD-10-CM | POA: Insufficient documentation

## 2013-06-10 DIAGNOSIS — Z8546 Personal history of malignant neoplasm of prostate: Secondary | ICD-10-CM | POA: Insufficient documentation

## 2013-06-10 DIAGNOSIS — Z791 Long term (current) use of non-steroidal anti-inflammatories (NSAID): Secondary | ICD-10-CM | POA: Insufficient documentation

## 2013-06-10 DIAGNOSIS — J4489 Other specified chronic obstructive pulmonary disease: Secondary | ICD-10-CM | POA: Insufficient documentation

## 2013-06-10 DIAGNOSIS — J449 Chronic obstructive pulmonary disease, unspecified: Secondary | ICD-10-CM | POA: Insufficient documentation

## 2013-06-10 DIAGNOSIS — Z79899 Other long term (current) drug therapy: Secondary | ICD-10-CM | POA: Insufficient documentation

## 2013-06-10 DIAGNOSIS — I509 Heart failure, unspecified: Secondary | ICD-10-CM | POA: Insufficient documentation

## 2013-06-10 DIAGNOSIS — R531 Weakness: Secondary | ICD-10-CM

## 2013-06-10 DIAGNOSIS — Z7982 Long term (current) use of aspirin: Secondary | ICD-10-CM | POA: Insufficient documentation

## 2013-06-10 LAB — CBC WITH DIFFERENTIAL/PLATELET
BASOS PCT: 0 % (ref 0–1)
Basophils Absolute: 0 10*3/uL (ref 0.0–0.1)
EOS ABS: 0.1 10*3/uL (ref 0.0–0.7)
Eosinophils Relative: 2 % (ref 0–5)
HCT: 34.1 % — ABNORMAL LOW (ref 39.0–52.0)
Hemoglobin: 11.3 g/dL — ABNORMAL LOW (ref 13.0–17.0)
Lymphocytes Relative: 35 % (ref 12–46)
Lymphs Abs: 1.3 10*3/uL (ref 0.7–4.0)
MCH: 27.7 pg (ref 26.0–34.0)
MCHC: 33.1 g/dL (ref 30.0–36.0)
MCV: 83.6 fL (ref 78.0–100.0)
Monocytes Absolute: 0.4 10*3/uL (ref 0.1–1.0)
Monocytes Relative: 11 % (ref 3–12)
NEUTROS PCT: 52 % (ref 43–77)
Neutro Abs: 1.9 10*3/uL (ref 1.7–7.7)
PLATELETS: 357 10*3/uL (ref 150–400)
RBC: 4.08 MIL/uL — ABNORMAL LOW (ref 4.22–5.81)
RDW: 13.9 % (ref 11.5–15.5)
WBC: 3.7 10*3/uL — ABNORMAL LOW (ref 4.0–10.5)

## 2013-06-10 LAB — URINE MICROSCOPIC-ADD ON

## 2013-06-10 LAB — URINALYSIS, ROUTINE W REFLEX MICROSCOPIC
GLUCOSE, UA: NEGATIVE mg/dL
Hgb urine dipstick: NEGATIVE
Ketones, ur: 15 mg/dL — AB
LEUKOCYTES UA: NEGATIVE
Nitrite: NEGATIVE
Protein, ur: 30 mg/dL — AB
Specific Gravity, Urine: 1.028 (ref 1.005–1.030)
UROBILINOGEN UA: 1 mg/dL (ref 0.0–1.0)
pH: 5.5 (ref 5.0–8.0)

## 2013-06-10 LAB — COMPREHENSIVE METABOLIC PANEL
ALBUMIN: 4.3 g/dL (ref 3.5–5.2)
ALK PHOS: 95 U/L (ref 39–117)
ALT: 6 U/L (ref 0–53)
AST: 15 U/L (ref 0–37)
BUN: 16 mg/dL (ref 6–23)
CO2: 23 mEq/L (ref 19–32)
Calcium: 10.3 mg/dL (ref 8.4–10.5)
Chloride: 96 mEq/L (ref 96–112)
Creatinine, Ser: 0.89 mg/dL (ref 0.50–1.35)
GFR calc Af Amer: >90 mL/min (ref >90)
GFR calc non Af Amer: 83 mL/min — ABNORMAL LOW (ref >90)
Glucose, Bld: 90 mg/dL (ref 70–99)
POTASSIUM: 4 meq/L (ref 3.7–5.3)
SODIUM: 135 meq/L — AB (ref 137–147)
TOTAL PROTEIN: 8.9 g/dL — AB (ref 6.0–8.3)
Total Bilirubin: 1 mg/dL (ref 0.3–1.2)

## 2013-06-10 LAB — PROTIME-INR
INR: 1.52 — AB (ref 0.00–1.49)
PROTHROMBIN TIME: 17.9 s — AB (ref 11.6–15.2)

## 2013-06-10 LAB — TYPE AND SCREEN
ABO/RH(D): B POS
ANTIBODY SCREEN: NEGATIVE

## 2013-06-10 LAB — APTT: aPTT: 44 seconds — ABNORMAL HIGH (ref 24–37)

## 2013-06-10 LAB — TROPONIN I: Troponin I: <0.3 ng/mL (ref <0.30)

## 2013-06-10 MED ORDER — SODIUM CHLORIDE 0.9 % IV SOLN
INTRAVENOUS | Status: DC
  Administered 2013-06-10: 20:00:00 via INTRAVENOUS

## 2013-06-10 MED ORDER — SODIUM CHLORIDE 0.9 % IV BOLUS (SEPSIS)
1000.0000 mL | Freq: Once | INTRAVENOUS | Status: AC
  Administered 2013-06-10: 1000 mL via INTRAVENOUS

## 2013-06-10 NOTE — ED Notes (Signed)
Pt presents to department for evaluation of generalized weakness, poor appetite and weight loss. Family states he has been very weak x1 month, not eating much, and sleeping more than usual. He is alert and oriented x4. Denies pain.

## 2013-06-10 NOTE — Discharge Instructions (Signed)

## 2013-06-10 NOTE — ED Provider Notes (Signed)
CSN: [id number]     Arrival date & time 06/10/13  1525 History   First MD Initiated Contact with Patient 06/10/13 1948     Chief Complaint  Patient presents with  . Weakness     (Consider location/radiation/quality/duration/timing/severity/associated sxs/prior Treatment) Patient is a 73 y.o. male presenting with weakness. The history is provided by the patient and a relative.  Weakness  pt here with weakness x 1 month and poor oral intake--wieght loss noted by sister--pt will drink water but not eat solid food--increased trouble ambulating--some cough but no fever, --no vomiting or diarrhea, no black or bloody stools--no reported chest pain/abd pain, or sob--h/o anemia and renal problems dx by pcp 3 weeks ago, values are unknown  Past Medical History  Diagnosis Date  . Poor dentition   . COPD (chronic obstructive pulmonary disease)   . CHF (congestive heart failure)   . History of blood transfusion 08/19/2011    "first time"  . Prostate cancer 2011    seed implant  . Atrial flutter     h/o; successful cardioversion,TEE guided  . Iron deficiency anemia     HX BLOOD TX  . Aortic valve regurgitation     moderate AR by echo 2010  . Mitral valve regurgitation     moderate to severe by 2D, mod by TEE '10  . Dementia    Past Surgical History  Procedure Laterality Date  . 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 Esophagogastroduodenoscopy  08/21/2011    Procedure: ESOPHAGOGASTRODUODENOSCOPY (EGD);  Surgeon: Lear Ng, MD;  Location: [facility] ENDOSCOPY;  Service: Endoscopy;  Laterality: N/A;  . Colonoscopy  08/21/2011    Procedure: COLONOSCOPY;  Surgeon: Lear Ng, MD;  Location: [facility] ENDOSCOPY;  Service: Endoscopy;  Laterality: N/A;  . Joint replacement    . Irrigation and debridement knee  12/23/2011  . I&d extremity  12/24/2011    Procedure: IRRIGATION AND DEBRIDEMENT EXTREMITY;  Surgeon: Sharmon Revere, MD;  Location: [place];  Service:  Orthopedics;  Laterality: Right;  POSSIBLE WOUND CLOSURE  . Incision and drainage of wound  12/26/2011    Procedure: IRRIGATION AND DEBRIDEMENT WOUND;  Surgeon: Sharmon Revere, MD;  Location: [place];  Service: Orthopedics;  Laterality: Right;  I&D of Wound Right Hip and Wound Closure  . Incision and drainage of wound Right 04/28/2012    Procedure: IRRIGATION AND DEBRIDEMENT WOUND RIGHT HIP POSSIBLE CLOSURE;  Surgeon: Sharmon Revere, MD;  Location: [place];  Service: Orthopedics;  Laterality: Right;  . Incision and drainage hip Right 04/30/2012    Procedure: IRRIGATION AND DEBRIDEMENT HIP;  Surgeon: Sharmon Revere, MD;  Location: [place];  Service: Orthopedics;  Laterality: Right;  . Incision and drainage hip Right 05/26/2012    Procedure: IRRIGATION AND DEBRIDEMENT HIP and closure of wound;  Surgeon: Sharmon Revere, MD;  Location: [place];  Service: Orthopedics;  Laterality: Right;   History reviewed. No pertinent family history. History  Substance Use Topics  . Smoking status: Current Every Day Smoker -- 1.00 packs/day for 57 years    Types: Cigarettes  . Smokeless tobacco: Never Used  . Alcohol Use: Not on file     Comment: weekly    Review of Systems  Neurological: Positive for weakness.  All other systems reviewed and are negative.     Allergies  Penicillins  Home Medications   Prior to Admission medications   Medication Sig Start Date End  Date Taking? Authorizing Provider  amoxicillin-clavulanate (AUGMENTIN) 500-125 MG per tablet Take 1 tablet by mouth 3 (three) times daily. 30 day supply filled 05/03/2012    Historical Provider, MD  aspirin EC 325 MG EC tablet Take 1 tablet (325 mg total) by mouth daily with breakfast. 12/28/11   Sharmon Revere, MD  ferrous sulfate 325 (65 FE) MG tablet Take 325 mg by mouth 2 (two) times daily.    Historical Provider, MD  furosemide (LASIX) 20 MG tablet Take 20 mg by mouth daily.    Historical Provider, MD  haloperidol (HALDOL) 1 MG tablet Take  0.5 mg by mouth every 6 (six) hours as needed (agitation).     Historical Provider, MD  HYDROcodone-acetaminophen (NORCO/VICODIN) 5-325 MG per tablet Take 1-2 tablets by mouth every 6 (six) hours as needed. 05/28/12   Sharmon Revere, MD  meloxicam (MOBIC) 15 MG tablet Take 15 mg by mouth daily.    Historical Provider, MD   BP 132/55  Pulse 85  Temp(Src) 97 F (36.1 C) (Oral)  Resp 18  Wt 126 lb 5 oz (57.295 kg)  SpO2 100% Physical Exam  Nursing note and vitals reviewed. Constitutional: He is oriented to person, place, and time. He appears cachectic.  Non-toxic appearance. He has a sickly appearance. He appears ill. No distress.  HENT:  Head: Normocephalic and atraumatic.  Eyes: Conjunctivae, EOM and lids are normal. Pupils are equal, round, and reactive to light.  Pale conjunctiva  Neck: Normal range of motion. Neck supple. No tracheal deviation present. No mass present.  Cardiovascular: Normal rate, regular rhythm and normal heart sounds.  Exam reveals no gallop.   No murmur heard. Pulmonary/Chest: Effort normal and breath sounds normal. No stridor. No respiratory distress. He has no decreased breath sounds. He has no wheezes. He has no rhonchi. He has no rales.  Abdominal: Soft. Normal appearance and bowel sounds are normal. He exhibits no distension. There is no tenderness. There is no rebound and no CVA tenderness.  Musculoskeletal: Normal range of motion. He exhibits no edema and no tenderness.  Neurological: He is alert and oriented to person, place, and time. He has normal strength. No cranial nerve deficit or sensory deficit. Coordination normal. GCS eye subscore is 4. GCS verbal subscore is 5. GCS motor subscore is 6.  Skin: Skin is warm and dry. No abrasion and no rash noted.  Psychiatric: His affect is blunt. His speech is delayed. He is slowed.    ED Course  Procedures (including critical care time) Labs Review Labs Reviewed  CBC WITH DIFFERENTIAL - Abnormal; Notable for  the following:    WBC 3.7 (*)    RBC 4.08 (*)    Hemoglobin 11.3 (*)    HCT 34.1 (*)    All other components within normal limits  COMPREHENSIVE METABOLIC PANEL - Abnormal; Notable for the following:    Sodium 135 (*)    Total Protein 8.9 (*)    GFR calc non Af Amer 83 (*)    All other components within normal limits  PROTIME-INR - Abnormal; Notable for the following:    Prothrombin Time 17.9 (*)    INR 1.52 (*)    All other components within normal limits  APTT - Abnormal; Notable for the following:    aPTT 44 (*)    All other components within normal limits  URINE CULTURE  TROPONIN I  URINALYSIS, ROUTINE W REFLEX MICROSCOPIC  TYPE AND SCREEN    Imaging Review No results  found.   EKG Interpretation None      MDM   Final diagnoses:  None     Date: 06/10/2013  Rate: 73  Rhythm: normal sinus rhythm  QRS Axis: normal  Intervals: normal  ST/T Wave abnormalities: normal  Conduction Disutrbances:none  Narrative Interpretation:   Old EKG Reviewed: none available  Patient with normal chest x-ray. INR slightly elevated.ua nl, patient given IV fluids    Leota Jacobsen, MD 06/10/13 [phone]

## 2013-06-12 LAB — URINE CULTURE
COLONY COUNT: NO GROWTH
Culture: NO GROWTH

## 2013-07-10 ENCOUNTER — Emergency Department (HOSPITAL_COMMUNITY)
Admission: EM | Admit: 2013-07-10 | Discharge: 2013-07-11 | Disposition: A | Discharge status: Home / Self Care | Payer: Medicare Other | Attending: Emergency Medicine | Admitting: Emergency Medicine

## 2013-07-10 ENCOUNTER — Emergency Department (HOSPITAL_COMMUNITY): Payer: Medicare Other

## 2013-07-10 ENCOUNTER — Encounter (HOSPITAL_COMMUNITY): Payer: Self-pay | Admitting: Emergency Medicine

## 2013-07-10 DIAGNOSIS — J449 Chronic obstructive pulmonary disease, unspecified: Secondary | ICD-10-CM | POA: Insufficient documentation

## 2013-07-10 DIAGNOSIS — R079 Chest pain, unspecified: Secondary | ICD-10-CM

## 2013-07-10 DIAGNOSIS — F0390 Unspecified dementia without behavioral disturbance: Secondary | ICD-10-CM | POA: Insufficient documentation

## 2013-07-10 DIAGNOSIS — Z792 Long term (current) use of antibiotics: Secondary | ICD-10-CM | POA: Insufficient documentation

## 2013-07-10 DIAGNOSIS — I509 Heart failure, unspecified: Secondary | ICD-10-CM | POA: Insufficient documentation

## 2013-07-10 DIAGNOSIS — N289 Disorder of kidney and ureter, unspecified: Secondary | ICD-10-CM

## 2013-07-10 DIAGNOSIS — F17200 Nicotine dependence, unspecified, uncomplicated: Secondary | ICD-10-CM | POA: Insufficient documentation

## 2013-07-10 DIAGNOSIS — Z8546 Personal history of malignant neoplasm of prostate: Secondary | ICD-10-CM | POA: Insufficient documentation

## 2013-07-10 DIAGNOSIS — Z8679 Personal history of other diseases of the circulatory system: Secondary | ICD-10-CM | POA: Insufficient documentation

## 2013-07-10 DIAGNOSIS — J4489 Other specified chronic obstructive pulmonary disease: Secondary | ICD-10-CM | POA: Insufficient documentation

## 2013-07-10 DIAGNOSIS — Z880 Allergy status to penicillin: Secondary | ICD-10-CM | POA: Insufficient documentation

## 2013-07-10 DIAGNOSIS — Z7982 Long term (current) use of aspirin: Secondary | ICD-10-CM | POA: Insufficient documentation

## 2013-07-10 DIAGNOSIS — Z862 Personal history of diseases of the blood and blood-forming organs and certain disorders involving the immune mechanism: Secondary | ICD-10-CM | POA: Insufficient documentation

## 2013-07-10 LAB — CBC
HEMATOCRIT: 31.7 % — AB (ref 39.0–52.0)
HEMOGLOBIN: 10.8 g/dL — AB (ref 13.0–17.0)
MCH: 28.3 pg (ref 26.0–34.0)
MCHC: 34.1 g/dL (ref 30.0–36.0)
MCV: 83.2 fL (ref 78.0–100.0)
Platelets: 307 10*3/uL (ref 150–400)
RBC: 3.81 MIL/uL — AB (ref 4.22–5.81)
RDW: 13.5 % (ref 11.5–15.5)
WBC: 3.9 10*3/uL — ABNORMAL LOW (ref 4.0–10.5)

## 2013-07-10 LAB — COMPREHENSIVE METABOLIC PANEL
ALT: 11 U/L (ref 0–53)
ANION GAP: 17 — AB (ref 5–15)
AST: 20 U/L (ref 0–37)
Albumin: 4.1 g/dL (ref 3.5–5.2)
Alkaline Phosphatase: 70 U/L (ref 39–117)
BUN: 23 mg/dL (ref 6–23)
CALCIUM: 10 mg/dL (ref 8.4–10.5)
CO2: 22 meq/L (ref 19–32)
CREATININE: 1.45 mg/dL — AB (ref 0.50–1.35)
Chloride: 94 mEq/L — ABNORMAL LOW (ref 96–112)
GFR, EST AFRICAN AMERICAN: 54 mL/min — AB (ref >90)
GFR, EST NON AFRICAN AMERICAN: 47 mL/min — AB (ref >90)
GLUCOSE: 92 mg/dL (ref 70–99)
Potassium: 4.1 mEq/L (ref 3.7–5.3)
Sodium: 133 mEq/L — ABNORMAL LOW (ref 137–147)
TOTAL PROTEIN: 8.2 g/dL (ref 6.0–8.3)
Total Bilirubin: 0.6 mg/dL (ref 0.3–1.2)

## 2013-07-10 LAB — PROTIME-INR
INR: 1.66 — AB (ref 0.00–1.49)
PROTHROMBIN TIME: 19.6 s — AB (ref 11.6–15.2)

## 2013-07-10 LAB — I-STAT TROPONIN, ED: TROPONIN I, POC: 0 ng/mL (ref 0.00–0.08)

## 2013-07-10 MED ORDER — ASPIRIN 81 MG PO CHEW
324.0000 mg | CHEWABLE_TABLET | Freq: Once | ORAL | Status: AC
  Administered 2013-07-10: 324 mg via ORAL
  Filled 2013-07-10: qty 4

## 2013-07-10 MED ORDER — SODIUM CHLORIDE 0.9 % IV SOLN
1000.0000 mL | INTRAVENOUS | Status: DC
  Administered 2013-07-10: 1000 mL via INTRAVENOUS

## 2013-07-10 NOTE — ED Provider Notes (Signed)
CSN: [id number]     Arrival date & time 07/10/13  2153 History   First MD Initiated Contact with Patient 07/10/13 2156     Chief Complaint  Patient presents with  . Chest Pain    Level V caveat: Dementia HPI Patient presents to the emergency room with complaints of chest pain. Patient has had issues with decreased oral intake. This has been ongoing for at least the past week. The patient's sister states he has not been eating well. They have to constantly remind him to eat and drink. Patient has not had any trouble with fever, vomiting or diarrhea. This weekend over the Fourth of July he had a fall. Family thinks it was because he was weak. Today he told his family he was having some chest pain. Patient's not able to elaborate any further. He denies having any chest pain right now the knee. He denies feeling short of breath. He does not have any history of heart disease. He has no history of blood clots. Past Medical History  Diagnosis Date  . Poor dentition   . COPD (chronic obstructive pulmonary disease)   . CHF (congestive heart failure)   . History of blood transfusion 08/19/2011    "first time"  . Prostate cancer 2011    seed implant  . Atrial flutter     h/o; successful cardioversion,TEE guided  . Iron deficiency anemia     HX BLOOD TX  . Aortic valve regurgitation     moderate AR by echo 2010  . Mitral valve regurgitation     moderate to severe by 2D, mod by TEE '10  . Dementia    Past Surgical History  Procedure Laterality Date  . 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 Esophagogastroduodenoscopy  08/21/2011    Procedure: ESOPHAGOGASTRODUODENOSCOPY (EGD);  Surgeon: Lear Ng, MD;  Location: [facility] ENDOSCOPY;  Service: Endoscopy;  Laterality: N/A;  . Colonoscopy  08/21/2011    Procedure: COLONOSCOPY;  Surgeon: Lear Ng, MD;  Location: [facility] ENDOSCOPY;  Service: Endoscopy;  Laterality: N/A;  . Joint replacement    . Irrigation and  debridement knee  12/23/2011  . I&d extremity  12/24/2011    Procedure: IRRIGATION AND DEBRIDEMENT EXTREMITY;  Surgeon: Sharmon Revere, MD;  Location: [place];  Service: Orthopedics;  Laterality: Right;  POSSIBLE WOUND CLOSURE  . Incision and drainage of wound  12/26/2011    Procedure: IRRIGATION AND DEBRIDEMENT WOUND;  Surgeon: Sharmon Revere, MD;  Location: [place];  Service: Orthopedics;  Laterality: Right;  I&D of Wound Right Hip and Wound Closure  . Incision and drainage of wound Right 04/28/2012    Procedure: IRRIGATION AND DEBRIDEMENT WOUND RIGHT HIP POSSIBLE CLOSURE;  Surgeon: Sharmon Revere, MD;  Location: [place];  Service: Orthopedics;  Laterality: Right;  . Incision and drainage hip Right 04/30/2012    Procedure: IRRIGATION AND DEBRIDEMENT HIP;  Surgeon: Sharmon Revere, MD;  Location: [place];  Service: Orthopedics;  Laterality: Right;  . Incision and drainage hip Right 05/26/2012    Procedure: IRRIGATION AND DEBRIDEMENT HIP and closure of wound;  Surgeon: Sharmon Revere, MD;  Location: [place];  Service: Orthopedics;  Laterality: Right;   History reviewed. No pertinent family history. History  Substance Use Topics  . Smoking status: Current Every Day Smoker -- 1.00 packs/day for 57 years    Types: Cigarettes  . Smokeless tobacco: Never Used  . Alcohol Use: 1.2  oz/week    2 Cans of beer per week     Comment: weekly    Review of Systems  All other systems reviewed and are negative.     Allergies  Penicillins  Home Medications   Prior to Admission medications   Medication Sig Start Date End Date Taking? Authorizing Provider  aspirin EC 325 MG EC tablet Take 1 tablet (325 mg total) by mouth daily with breakfast. 12/28/11  Yes Sharmon Revere, MD  haloperidol (HALDOL) 1 MG tablet Take 1 mg by mouth every 6 (six) hours as needed for agitation (agitation).    Yes Historical Provider, MD  HYDROcodone-acetaminophen (NORCO) 10-325 MG per tablet Take 1 tablet by mouth every 6 (six)  hours as needed for moderate pain.   Yes Historical Provider, MD  sulfamethoxazole-trimethoprim (BACTRIM DS,SEPTRA DS) 800-160 MG per tablet Take 1 tablet by mouth 2 (two) times daily. Started 06/30/13, for 7 days, ending 07/06/13   Yes Historical Provider, MD   BP 120/58  Pulse 83  Temp(Src) 97.9 F (36.6 C) (Oral)  Resp 13  Ht 5\' 10"  (1.778 m)  Wt 126 lb (57.153 kg)  BMI 18.08 kg/m2  SpO2 99% Physical Exam  Nursing note and vitals reviewed. Constitutional: No distress.  HENT:  Head: Normocephalic and atraumatic.  Right Ear: External ear normal.  Left Ear: External ear normal.  Eyes: Conjunctivae are normal. Right eye exhibits no discharge. Left eye exhibits no discharge. No scleral icterus.  Neck: Neck supple. No tracheal deviation present.  Cardiovascular: Normal rate, regular rhythm and intact distal pulses.   Pulmonary/Chest: Effort normal and breath sounds normal. No stridor. No respiratory distress. He has no wheezes. He has no rales.  Abdominal: Soft. Bowel sounds are normal. He exhibits no distension. There is no tenderness. There is no rebound and no guarding.  Musculoskeletal: He exhibits no edema and no tenderness.  Neurological: He is alert. He has normal strength. No cranial nerve deficit (no facial droop, extraocular movements intact, no slurred speech) or sensory deficit. He exhibits normal muscle tone. He displays no seizure activity. Coordination normal.  Skin: Skin is warm and dry. No rash noted.  Psychiatric: He has a normal mood and affect.    ED Course  Procedures (including critical care time) Labs Review Labs Reviewed  CBC - Abnormal; Notable for the following:    WBC 3.9 (*)    RBC 3.81 (*)    Hemoglobin 10.8 (*)    HCT 31.7 (*)    All other components within normal limits  COMPREHENSIVE METABOLIC PANEL - Abnormal; Notable for the following:    Sodium 133 (*)    Chloride 94 (*)    Creatinine, Ser 1.45 (*)    GFR calc non Af Amer 47 (*)    GFR calc Af  Amer 54 (*)    Anion gap 17 (*)    All other components within normal limits  PROTIME-INR - Abnormal; Notable for the following:    Prothrombin Time 19.6 (*)    INR 1.66 (*)    All other components within normal limits  I-STAT TROPOININ, ED    Imaging Review Dg Chest 2 View  07/10/2013   CLINICAL DATA:  Sternal chest pain for several days.  EXAM: CHEST  2 VIEW  COMPARISON:  06/10/2013  FINDINGS: Hyperinflation consistent with emphysema. The heart size and mediastinal contours are within normal limits. Both lungs are clear. The visualized skeletal structures are unremarkable.  IMPRESSION: Emphysematous changes in the lungs. No  evidence of active pulmonary disease.   Electronically Signed   By: Lucienne Capers M.D.   On: 07/10/2013 23:36     EKG Interpretation   Date/Time:  Monday July 10 2013 21:59:05 EDT Ventricular Rate:  93 PR Interval:  151 QRS Duration: 71 QT Interval:  361 QTC Calculation: 449 R Axis:   80 Text Interpretation:  Sinus rhythm Inferior infarct, acute (LCx)  Anteroseptal infarct, age indeterminate Baseline wander in lead(s) V3 V4  V5 No significant change since last tracing Confirmed by Tamula Morrical  MD-J, Deadra Diggins  ([id number]) on 07/10/2013 10:07:58 PM      MDM   Final diagnoses:  Chest pain, unspecified chest pain type  Renal insufficiency   Pt's chest pain complaint was vague.  He denied symptoms here.  Renal function is worse than last month.  Most likely related to his decreased po intake.  Discussed encouraging fluids.  Discussed importance of having renal function rechecked by PCP this week.    Dorie Rank, MD 07/11/13 [phone]

## 2013-07-10 NOTE — ED Notes (Signed)
Patient transported to X-ray 

## 2013-07-10 NOTE — ED Notes (Signed)
Patient presents via EMS with sister stating that he told her today that his chest was hurting.  Patient with a history of dementia.  Sister stated that he fell on the 4th of July due to being weak.  Has had decreased PO intake for about 1 week.  EMS gave him 1 81mg  ASA and he just left it in his mouth.  EMS removed it due to his possibly choking.  CBG 118

## 2013-07-11 MED ORDER — SODIUM CHLORIDE 0.9 % IV BOLUS (SEPSIS): 500.0000 mL | Freq: Once | INTRAVENOUS | Status: DC

## 2013-07-11 NOTE — Discharge Instructions (Signed)
Chest Pain (Nonspecific) It is often hard to give a diagnosis for the cause of chest pain. There is always a chance that your pain could be related to something serious, such as a heart attack or a blood clot in the lungs. You need to follow up with your doctor. HOME CARE  If antibiotic medicine was given, take it as directed by your doctor. Finish the medicine even if you start to feel better.  For the next few days, avoid activities that bring on chest pain. Continue physical activities as told by your doctor.  Do not use any tobacco products. This includes cigarettes, chewing tobacco, and e-cigarettes.  Avoid drinking alcohol.  Only take medicine as told by your doctor.  Follow your doctor's suggestions for more testing if your chest pain does not go away.  Keep all doctor visits you made. GET HELP IF:  Your chest pain does not go away, even after treatment.  You have a rash with blisters on your chest.  You have a fever. GET HELP RIGHT AWAY IF:   You have more pain or pain that spreads to your arm, neck, jaw, back, or belly (abdomen).  You have shortness of breath.  You cough more than usual or cough up blood.  You have very bad back or belly pain.  You feel sick to your stomach (nauseous) or throw up (vomit).  You have very bad weakness.  You pass out (faint).  You have chills. This is an emergency. Do not wait to see if the problems will go away. Call your local emergency services (911 in [place] Do not drive yourself to the hospital. MAKE SURE YOU:   Understand these instructions.  Will watch your condition.  Will get help right away if you are not doing well or get worse. Document Released: 06/10/2007 Document Revised: 12/27/2012 Document Reviewed: 06/10/2007 [facility] Patient Information 2015 [place], [place]. This information is not intended to replace advice given to you by your health care provider. Make sure you discuss any questions you have with your  health care provider.  Kidney Disease, Adult The kidneys are two organs that lie on either side of the spine between the middle of the back and the front of the abdomen. The kidneys:   Remove wastes and extra water from the blood.   Produce important hormones. These regulate blood pressure, help keep bones strong, and help create red blood cells.   Balance the fluids and chemicals in the blood and tissues. Kidney disease occurs when the kidneys are damaged. Kidney damage may be sudden (acute) or develop over a long period (chronic). A small amount of damage may not cause problems, but a large amount of damage may make it difficult or impossible for the kidneys to work the way they should. Early detection and treatment of kidney disease may prevent kidney damage from becoming permanent or getting worse. Some kidney diseases are curable, but most are not. Many people with kidney disease are able to control the disease and live a normal life.  TYPES OF KIDNEY DISEASE  Acute kidney injury.Acute kidney injury occurs when there is sudden damage to the kidneys.  Chronic kidney disease. Chronic kidney disease occurs when the kidneys are damaged over a long period.  End-stage kidney disease. End-stage kidney disease occurs when the kidneys are so damaged that they stop working. In end-stage kidney disease, the kidneys cannot get better. CAUSES Any condition, disease, or event that damages the kidneys may cause kidney disease. Acute kidney  injury.  A problem with blood flow to the kidneys. This may be caused by:   Blood loss.   Heart disease.   Severe burns.   Liver disease.  Direct damage to the kidneys. This may be caused by:  Some medicines.   A kidney infection.   Poisoning or consuming toxic substances.   A surgical wound.   A blow to the kidney area.   A problem with urine flow. This may be caused by:   Cancer.   Kidney stones.   An enlarged  prostate. Chronic kidney disease. The most common causes of chronic kidney disease are diabetes and high blood pressure (hypertension). Chronic kidney disease may also be caused by:   Diseases that cause the filtering units of the kidneys to become inflamed.   Diseases that affect the immune system.   Genetic diseases.   Medicines that damage the kidneys, such as anti-inflammatory medicines.  Poisoning or exposure to toxic substances.   A reoccurring kidney or urinary infection.   A problem with urine flow. This may be caused by:  Cancer.   Kidney stones.   An enlarged prostate in males. End-stage kidney disease. This kidney disease usually occurs when a chronic kidney disease gets worse. It may also occur after acute kidney injury.  SYMPTOMS   Swelling (edema) of the legs, ankles, or feet.   Tiredness (lethargy).   Nausea or vomiting.   Confusion.   Problems with urination, such as:   Painful or burning feeling during urination.   Decreased urine production.  Bloody urine.   Frequent urination, especially at night.  Hypertension.  Muscle twitches and cramps.   Shortness of breath.   Persistent itchiness.   Loss of appetite.  Metallic taste in the mouth.   Weakness.   Seizures.   Chest pain or pressure.   Trouble sleeping.   Headaches.   Abnormally dark or light skin.   Numbness in the hands or feet.   Easy bruising.   Frequent hiccups.   Menstruation stops. Sometimes, no symptoms are present. DIAGNOSIS  Kidney disease may be detected and diagnosed by tests, including blood, urine, imaging, or kidney biopsy tests.  TREATMENT  Acute kidney injury. Treatment of acute kidney injury varies depending on the cause and severity of the kidney damage. In mild cases, no treatment may be needed. The kidneys may heal on their own. If acute kidney injury is more severe, your caregiver will treat the cause of the kidney  damage, help the kidneys heal, and prevent complications from occurring. Severe cases may require a procedure to remove toxic wastes from the body (dialysis) or surgery to repair kidney damage. Surgery may involve:   Repair of a torn kidney.   Removal of an obstruction.  Most of the time, you will need to stay overnight at the hospital.  Chronic kidney disease. Most chronic kidney diseases cannot be cured. Treatment usually involves relieving symptoms and preventing or slowing the progression of the disease. Treatment may include:   A special diet. You may need to avoid alcohol and foods that:   Have added salt.   Are high in potassium.   Are high in protein.   Medicines. These may:   Lower blood pressure.   Relieve anemia.   Relieve swelling.   Protect the bones.  End-stage kidney disease. End-stage kidney disease is life-threatening and must be treated immediately. There are two treatments for end-stage kidney disease:   Dialysis.   Receiving a new kidney (kidney  transplant). Both of these treatments have serious risks and consequences. In addition to having dialysis or a kidney transplant, you may need to take medicines to control hypertension and cholesterol and to decrease phosphorus levels in your blood. LENGTH OF ILLNESS  Acute kidney injury.The length of this disease varies greatly from person to person. Exactly how long it lasts depends on the cause of the kidney damage. Acute kidney injury may develop into chronic kidney disease or end-stage kidney disease.  Chronic kidney disease. This disease usually lasts a lifetime. Chronic kidney disease may worsen over time to become end-stage kidney disease. The time it takes for end-stage kidney disease to develop varies from person to person.  End-stage kidney disease. This disease lasts until a kidney transplant is performed. PREVENTION  Kidney disease can sometimes be prevented. If you have diabetes,  hypertension, or any other condition that may lead to kidney disease, you should try to prevent kidney disease with:   An appropriate diet.  Medicine.  Lifestyle changes. FOR MORE INFORMATION  American Association of Kidney Patients: BombTimer.gl  National Kidney Foundation: www.kidney.[place]: https://mathis.com/  [facility]: www.lifeoptions.org and www.kidneyschool.org  Document Released: 12/22/2004 Document Revised: 12/09/2011 Document Reviewed: 08/21/2011 [facility] Patient Information 2015 [place], [place]. This information is not intended to replace advice given to you by your health care provider. Make sure you discuss any questions you have with your health care provider.

## 2013-07-25 ENCOUNTER — Encounter (HOSPITAL_COMMUNITY): Payer: Self-pay | Admitting: Emergency Medicine

## 2013-07-25 ENCOUNTER — Emergency Department (HOSPITAL_COMMUNITY): Payer: Medicare Other

## 2013-07-25 ENCOUNTER — Emergency Department (HOSPITAL_COMMUNITY)
Admission: EM | Admit: 2013-07-25 | Discharge: 2013-07-26 | Disposition: A | Discharge status: Home / Self Care | Payer: Medicare Other | Attending: Emergency Medicine | Admitting: Emergency Medicine

## 2013-07-25 DIAGNOSIS — Z8679 Personal history of other diseases of the circulatory system: Secondary | ICD-10-CM | POA: Insufficient documentation

## 2013-07-25 DIAGNOSIS — Z880 Allergy status to penicillin: Secondary | ICD-10-CM | POA: Insufficient documentation

## 2013-07-25 DIAGNOSIS — Z8719 Personal history of other diseases of the digestive system: Secondary | ICD-10-CM | POA: Diagnosis not present

## 2013-07-25 DIAGNOSIS — R627 Adult failure to thrive: Secondary | ICD-10-CM

## 2013-07-25 DIAGNOSIS — F0390 Unspecified dementia without behavioral disturbance: Secondary | ICD-10-CM | POA: Diagnosis not present

## 2013-07-25 DIAGNOSIS — Z862 Personal history of diseases of the blood and blood-forming organs and certain disorders involving the immune mechanism: Secondary | ICD-10-CM | POA: Diagnosis not present

## 2013-07-25 DIAGNOSIS — Z8546 Personal history of malignant neoplasm of prostate: Secondary | ICD-10-CM | POA: Insufficient documentation

## 2013-07-25 DIAGNOSIS — R5383 Other fatigue: Secondary | ICD-10-CM | POA: Diagnosis present

## 2013-07-25 DIAGNOSIS — Z79899 Other long term (current) drug therapy: Secondary | ICD-10-CM | POA: Diagnosis not present

## 2013-07-25 DIAGNOSIS — F17200 Nicotine dependence, unspecified, uncomplicated: Secondary | ICD-10-CM | POA: Diagnosis not present

## 2013-07-25 DIAGNOSIS — I509 Heart failure, unspecified: Secondary | ICD-10-CM | POA: Insufficient documentation

## 2013-07-25 DIAGNOSIS — E860 Dehydration: Secondary | ICD-10-CM | POA: Insufficient documentation

## 2013-07-25 DIAGNOSIS — R5381 Other malaise: Secondary | ICD-10-CM | POA: Diagnosis present

## 2013-07-25 DIAGNOSIS — J4489 Other specified chronic obstructive pulmonary disease: Secondary | ICD-10-CM | POA: Insufficient documentation

## 2013-07-25 DIAGNOSIS — J449 Chronic obstructive pulmonary disease, unspecified: Secondary | ICD-10-CM | POA: Insufficient documentation

## 2013-07-25 LAB — CBC
HCT: 27.9 % — ABNORMAL LOW (ref 39.0–52.0)
HEMOGLOBIN: 9.3 g/dL — AB (ref 13.0–17.0)
MCH: 28.1 pg (ref 26.0–34.0)
MCHC: 33.3 g/dL (ref 30.0–36.0)
MCV: 84.3 fL (ref 78.0–100.0)
Platelets: 296 10*3/uL (ref 150–400)
RBC: 3.31 MIL/uL — AB (ref 4.22–5.81)
RDW: 14.1 % (ref 11.5–15.5)
WBC: 4.5 10*3/uL (ref 4.0–10.5)

## 2013-07-25 LAB — BASIC METABOLIC PANEL
Anion gap: 14 (ref 5–15)
BUN: 26 mg/dL — AB (ref 6–23)
CO2: 23 mEq/L (ref 19–32)
Calcium: 10.1 mg/dL (ref 8.4–10.5)
Chloride: 98 mEq/L (ref 96–112)
Creatinine, Ser: 1.08 mg/dL (ref 0.50–1.35)
GFR calc Af Amer: 77 mL/min — ABNORMAL LOW (ref >90)
GFR calc non Af Amer: 67 mL/min — ABNORMAL LOW (ref >90)
GLUCOSE: 106 mg/dL — AB (ref 70–99)
POTASSIUM: 4.1 meq/L (ref 3.7–5.3)
Sodium: 135 mEq/L — ABNORMAL LOW (ref 137–147)

## 2013-07-25 LAB — CBG MONITORING, ED: Glucose-Capillary: 101 mg/dL — ABNORMAL HIGH (ref 70–99)

## 2013-07-25 MED ORDER — SODIUM CHLORIDE 0.9 % IV SOLN
1000.0000 mL | Freq: Once | INTRAVENOUS | Status: AC
  Administered 2013-07-26: 1000 mL via INTRAVENOUS

## 2013-07-25 MED ORDER — SODIUM CHLORIDE 0.9 % IV SOLN: 1000.0000 mL | INTRAVENOUS | Status: DC

## 2013-07-25 NOTE — ED Notes (Signed)
Bed: WA04 Expected date:  Expected time:  Means of arrival:  Comments: EMS 

## 2013-07-25 NOTE — ED Notes (Signed)
Pt has not been able to eat since he got out of the hospital on 7/06 and has been increasingly confused, especially today per the family. Hx of prostate CA.

## 2013-07-26 LAB — URINALYSIS, ROUTINE W REFLEX MICROSCOPIC
Glucose, UA: NEGATIVE mg/dL
KETONES UR: NEGATIVE mg/dL
NITRITE: NEGATIVE
PH: 5.5 (ref 5.0–8.0)
Protein, ur: 30 mg/dL — AB
SPECIFIC GRAVITY, URINE: 1.023 (ref 1.005–1.030)
Urobilinogen, UA: 1 mg/dL (ref 0.0–1.0)

## 2013-07-26 LAB — HEPATIC FUNCTION PANEL
ALBUMIN: 3.6 g/dL (ref 3.5–5.2)
ALT: 10 U/L (ref 0–53)
AST: 17 U/L (ref 0–37)
Alkaline Phosphatase: 68 U/L (ref 39–117)
BILIRUBIN DIRECT: 0.3 mg/dL (ref 0.0–0.3)
Indirect Bilirubin: 0.6 mg/dL (ref 0.3–0.9)
Total Bilirubin: 0.9 mg/dL (ref 0.3–1.2)
Total Protein: 7.6 g/dL (ref 6.0–8.3)

## 2013-07-26 LAB — URINE MICROSCOPIC-ADD ON

## 2013-07-26 LAB — I-STAT TROPONIN, ED: Troponin i, poc: 0 ng/mL (ref 0.00–0.08)

## 2013-07-26 LAB — PROTIME-INR
INR: 1.59 — AB (ref 0.00–1.49)
Prothrombin Time: 19 seconds — ABNORMAL HIGH (ref 11.6–15.2)

## 2013-07-26 LAB — LIPASE, BLOOD: LIPASE: 11 U/L (ref 11–59)

## 2013-07-26 NOTE — Discharge Instructions (Signed)
Dehydration, Adult Dehydration is when you lose more fluids from the body than you take in. Vital organs like the kidneys, brain, and heart cannot function without a proper amount of fluids and salt. Any loss of fluids from the body can cause dehydration.  CAUSES   Vomiting.  Diarrhea.  Excessive sweating.  Excessive urine output.  Fever. SYMPTOMS  Mild dehydration  Thirst.  Dry lips.  Slightly dry mouth. Moderate dehydration  Very dry mouth.  Sunken eyes.  Skin does not bounce back quickly when lightly pinched and released.  Dark urine and decreased urine production.  Decreased tear production.  Headache. Severe dehydration  Very dry mouth.  Extreme thirst.  Rapid, weak pulse (more than 100 beats per minute at rest).  Cold hands and feet.  Not able to sweat in spite of heat and temperature.  Rapid breathing.  Blue lips.  Confusion and lethargy.  Difficulty being awakened.  Minimal urine production.  No tears. DIAGNOSIS  Your caregiver will diagnose dehydration based on your symptoms and your exam. Blood and urine tests will help confirm the diagnosis. The diagnostic evaluation should also identify the cause of dehydration. TREATMENT  Treatment of mild or moderate dehydration can often be done at home by increasing the amount of fluids that you drink. It is best to drink small amounts of fluid more often. Drinking too much at one time can make vomiting worse. Refer to the home care instructions below. Severe dehydration needs to be treated at the hospital where you will probably be given intravenous (IV) fluids that contain water and electrolytes. HOME CARE INSTRUCTIONS   Ask your caregiver about specific rehydration instructions.  Drink enough fluids to keep your urine clear or pale yellow.  Drink small amounts frequently if you have nausea and vomiting.  Eat as you normally do.  Avoid:  Foods or drinks high in sugar.  Carbonated  drinks.  Juice.  Extremely hot or cold fluids.  Drinks with caffeine.  Fatty, greasy foods.  Alcohol.  Tobacco.  Overeating.  Gelatin desserts.  Wash your hands well to avoid spreading bacteria and viruses.  Only take over-the-counter or prescription medicines for pain, discomfort, or fever as directed by your caregiver.  Ask your caregiver if you should continue all prescribed and over-the-counter medicines.  Keep all follow-up appointments with your caregiver. SEEK MEDICAL CARE IF:  You have abdominal pain and it increases or stays in one area (localizes).  You have a rash, stiff neck, or severe headache.  You are irritable, sleepy, or difficult to awaken.  You are weak, dizzy, or extremely thirsty. SEEK IMMEDIATE MEDICAL CARE IF:   You are unable to keep fluids down or you get worse despite treatment.  You have frequent episodes of vomiting or diarrhea.  You have blood or green matter (bile) in your vomit.  You have blood in your stool or your stool looks black and tarry.  You have not urinated in 6 to 8 hours, or you have only urinated a small amount of very dark urine.  You have a fever.  You faint. MAKE SURE YOU:   Understand these instructions.  Will watch your condition.  Will get help right away if you are not doing well or get worse. Document Released: 12/22/2004 Document Revised: 03/16/2011 Document Reviewed: 08/11/2010 [employer] Patient Information 2015 [employer]. This information is not intended to replace advice given to you by your health care provider. Make sure you discuss any questions you have with your health care   provider.  

## 2013-07-26 NOTE — ED Provider Notes (Signed)
CSN: [id number]     Arrival date & time 07/25/13  2239 History   First MD Initiated Contact with Patient 07/25/13 2316     Chief Complaint  Patient presents with  . Weakness      HPI Patient is brought to the emergency department with a history of dementia and ongoing generalized weakness and decreased oral intake.  The family reports the patient continues to lose weight.  He has minimal appetite.  He became weak when he stood up to date.  No headaches.  Lives at home with his sister.  History of prostate cancer status post seed implant.  Family reports they never followed up with his urologist.  Patient denies abdominal pain or chest pain.  No reports of shortness of breath.  Family states the primary care physician is aware but doesn't seem to have many answers for the family.  Patient denies back pain or neck pain.  No flank pain.  No urinary complaints.  No hallucinations.  Denies unilateral arm or leg weakness.  He states he has no appetite.  Denies sore throat.   Past Medical History  Diagnosis Date  . Poor dentition   . COPD (chronic obstructive pulmonary disease)   . CHF (congestive heart failure)   . History of blood transfusion 08/19/2011    "first time"  . Prostate cancer 2011    seed implant  . Atrial flutter     h/o; successful cardioversion,TEE guided  . Iron deficiency anemia     HX BLOOD TX  . Aortic valve regurgitation     moderate AR by echo 2010  . Mitral valve regurgitation     moderate to severe by 2D, mod by TEE '10  . Dementia    Past Surgical History  Procedure Laterality Date  . 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 Esophagogastroduodenoscopy  08/21/2011    Procedure: ESOPHAGOGASTRODUODENOSCOPY (EGD);  Surgeon: Lear Ng, MD;  Location: [facility] ENDOSCOPY;  Service: Endoscopy;  Laterality: N/A;  . Colonoscopy  08/21/2011    Procedure: COLONOSCOPY;  Surgeon: Lear Ng, MD;  Location: [facility] ENDOSCOPY;  Service:  Endoscopy;  Laterality: N/A;  . Joint replacement    . Irrigation and debridement knee  12/23/2011  . I&d extremity  12/24/2011    Procedure: IRRIGATION AND DEBRIDEMENT EXTREMITY;  Surgeon: Sharmon Revere, MD;  Location: [place];  Service: Orthopedics;  Laterality: Right;  POSSIBLE WOUND CLOSURE  . Incision and drainage of wound  12/26/2011    Procedure: IRRIGATION AND DEBRIDEMENT WOUND;  Surgeon: Sharmon Revere, MD;  Location: [place];  Service: Orthopedics;  Laterality: Right;  I&D of Wound Right Hip and Wound Closure  . Incision and drainage of wound Right 04/28/2012    Procedure: IRRIGATION AND DEBRIDEMENT WOUND RIGHT HIP POSSIBLE CLOSURE;  Surgeon: Sharmon Revere, MD;  Location: [place];  Service: Orthopedics;  Laterality: Right;  . Incision and drainage hip Right 04/30/2012    Procedure: IRRIGATION AND DEBRIDEMENT HIP;  Surgeon: Sharmon Revere, MD;  Location: [place];  Service: Orthopedics;  Laterality: Right;  . Incision and drainage hip Right 05/26/2012    Procedure: IRRIGATION AND DEBRIDEMENT HIP and closure of wound;  Surgeon: Sharmon Revere, MD;  Location: [place];  Service: Orthopedics;  Laterality: Right;   History reviewed. No pertinent family history. History  Substance Use Topics  . Smoking status: Current Every Day Smoker -- 1.00 packs/day for 57 years  Types: Cigarettes  . Smokeless tobacco: Never Used  . Alcohol Use: 1.2 oz/week    2 Cans of beer per week     Comment: weekly    Review of Systems  All other systems reviewed and are negative.     Allergies  Penicillins  Home Medications   Prior to Admission medications   Medication Sig Start Date End Date Taking? Authorizing Provider  cholecalciferol (VITAMIN D) 400 UNITS TABS tablet Take 400 Units by mouth daily.   Yes Historical Provider, MD  haloperidol (HALDOL) 1 MG tablet Take 1 mg by mouth every 6 (six) hours as needed for agitation (agitation).    Yes Historical Provider, MD  HYDROcodone-acetaminophen (NORCO)  10-325 MG per tablet Take 1 tablet by mouth 2 (two) times daily as needed for moderate pain.    Yes Historical Provider, MD   BP 112/47  Pulse 76  Temp(Src) 98.1 F (36.7 C) (Oral)  Resp 15  SpO2 100% Physical Exam  Nursing note and vitals reviewed. Constitutional: He is oriented to person, place, and time. He appears cachectic. He is cooperative.  HENT:  Head: Normocephalic and atraumatic.  Temporal wasting  Eyes: EOM are normal.  Neck: Normal range of motion.  Cardiovascular: Normal rate, regular rhythm, normal heart sounds and intact distal pulses.   Pulmonary/Chest: Effort normal and breath sounds normal. No respiratory distress.  Abdominal: Soft. He exhibits no distension. There is no tenderness.  Musculoskeletal: Normal range of motion.  Neurological: He is alert and oriented to person, place, and time.  Skin: Skin is warm and dry. No rash noted.  Psychiatric: He has a normal mood and affect. Judgment normal.    ED Course  Procedures (including critical care time) Labs Review Labs Reviewed  BASIC METABOLIC PANEL - Abnormal; Notable for the following:    Sodium 135 (*)    Glucose, Bld 106 (*)    BUN 26 (*)    GFR calc non Af Amer 67 (*)    GFR calc Af Amer 77 (*)    All other components within normal limits  CBC - Abnormal; Notable for the following:    RBC 3.31 (*)    Hemoglobin 9.3 (*)    HCT 27.9 (*)    All other components within normal limits  URINALYSIS, ROUTINE W REFLEX MICROSCOPIC - Abnormal; Notable for the following:    Color, Urine AMBER (*)    APPearance CLOUDY (*)    Hgb urine dipstick SMALL (*)    Bilirubin Urine MODERATE (*)    Protein, ur 30 (*)    Leukocytes, UA MODERATE (*)    All other components within normal limits  PROTIME-INR - Abnormal; Notable for the following:    Prothrombin Time 19.0 (*)    INR 1.59 (*)    All other components within normal limits  URINE MICROSCOPIC-ADD ON - Abnormal; Notable for the following:    Bacteria, UA FEW  (*)    All other components within normal limits  CBG MONITORING, ED - Abnormal; Notable for the following:    Glucose-Capillary 101 (*)    All other components within normal limits  LIPASE, BLOOD  HEPATIC FUNCTION PANEL  I-STAT TROPOININ, ED    Imaging Review Dg Chest 2 View  07/26/2013   CLINICAL DATA:  Weakness.  Lack of appetite.  EXAM: CHEST  2 VIEW  COMPARISON:  Chest radiograph from 07/10/2013  FINDINGS: The lungs are hyperexpanded, with flattening of hemidiaphragms, compatible with COPD. There is no evidence of  focal opacification, pleural effusion or pneumothorax.  The heart is normal in size; the mediastinal contour is within normal limits. No acute osseous abnormalities are seen.  IMPRESSION: No acute cardiopulmonary process seen.  Findings of COPD.   Electronically Signed   By: Garald Balding M.D.   On: 07/26/2013 00:15     EKG Interpretation   Date/Time:  Tuesday July 25 2013 23:28:16 EDT Ventricular Rate:  87 PR Interval:  147 QRS Duration: 80 QT Interval:  367 QTC Calculation: 441 R Axis:   83 Text Interpretation:  Sinus rhythm Borderline right axis deviation ST  elevation, consider inferior injury No significant change was found  Confirmed by Jalesa Thien  MD, Lennette Bihari ([id number]) on 07/25/2013 11:41:30 PM      MDM   Final diagnoses:  Failure to thrive in adult  Dehydration    Patient will need to continue following up with his primary care physician closely.  No indication for head CT today.  Note and indication for CT abdomen and pelvis today.  I suspect he has a cancer somewhere.  His chest x-ray clear.  I do not think he needs additional workup in the emergency department.  He feels much better after IV fluids.  I stressed the importance to the patient as the patient's family to continue following up closely with the primary care physician.  I've asked that they try to use Ensure and boost and other nutritional supplements in an effort to increase his caloric  intake.    Hoy Morn, MD 07/26/13 [phone]

## 2013-08-03 ENCOUNTER — Telehealth: Payer: Self-pay

## 2013-08-03 ENCOUNTER — Ambulatory Visit (INDEPENDENT_AMBULATORY_CARE_PROVIDER_SITE_OTHER): Payer: Medicare Other | Admitting: Neurology

## 2013-08-03 ENCOUNTER — Encounter: Payer: Self-pay | Admitting: Neurology

## 2013-08-03 VITALS — BP 121/66 | HR 101 | Ht 71.0 in | Wt 121.0 lb

## 2013-08-03 DIAGNOSIS — D638 Anemia in other chronic diseases classified elsewhere: Secondary | ICD-10-CM

## 2013-08-03 DIAGNOSIS — F0391 Unspecified dementia with behavioral disturbance: Secondary | ICD-10-CM

## 2013-08-03 DIAGNOSIS — F03918 Unspecified dementia, unspecified severity, with other behavioral disturbance: Secondary | ICD-10-CM

## 2013-08-03 MED ORDER — RISPERIDONE 1 MG PO TABS: 1.0000 mg | ORAL_TABLET | Freq: Every day | ORAL | Status: DC

## 2013-08-03 NOTE — Progress Notes (Signed)
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  2. Moderate chronic small vessel ischemic disease.  3. Hemosiderin deposition in the bilateral occipital lobe sulci (ARIA-E). There are 2 chronic cerebral microhemorrhages in the left thalamus (ARIA-H). Findings may be due to underlying amyloid angiopathy.  Laboratory evaluation in February 2014 has demonstrates normal CMP, anemia, hemoglobin 9.5, vitamin B12 was at low-normal range 264, normal TSH, and active RPR,  He lost to followup, was admitted to the hospital for left hip infection, at February 2014 his Mini-Mental status is 12 out of 30, moderate gauge difficulty  He continued to decline rapidly, especially over the past 6 months, he had blood transfusion because of anemia, but no etiology was found, he has lost appetite, loss of more than 60 pounds over 6 months period of time, more confusion, more gait difficulty bowel and bladder incontinence, he tends to pace around at nighttime, difficulty sleep,   he only has rare impaction with his children, his sister is his power of attorney,   REVIEW OF SYSTEMS: Full 14 system review of systems performed and notable only for gait difficulty, forming, weight loss, incontinence, decreased appetite   ALLERGIES: Allergies  Allergen Reactions    . Penicillins Nausea And Vomiting    HOME MEDICATIONS: Current Outpatient Prescriptions on File Prior to Visit  Medication Sig Dispense Refill  . cholecalciferol (VITAMIN D) 400 UNITS TABS tablet Take 400 Units by mouth daily.      . haloperidol (HALDOL) 1 MG tablet Take 1 mg by mouth every 6 (six) hours as needed for agitation (agitation).       Marland Kitchen HYDROcodone-acetaminophen (NORCO) 10-325 MG per tablet Take 1 tablet by mouth 2 (two) times daily as needed for moderate pain.        No current facility-administered medications on file prior to visit.    PAST MEDICAL HISTORY: Past Medical History  Diagnosis Date  . Poor dentition   . COPD (chronic obstructive pulmonary disease)   . CHF (congestive heart failure)   . History of blood transfusion 08/19/2011    "first time"  . Prostate cancer 04-19-2009    seed implant  . Atrial flutter     h/o; successful cardioversion,TEE guided  . Iron deficiency anemia     HX BLOOD TX  . Aortic valve regurgitation     moderate AR by echo 04/19/08  . Mitral valve regurgitation     moderate to severe by 2D, mod by TEE '10  . Dementia     PAST SURGICAL HISTORY: Past Surgical History  Procedure Laterality Date  . Total hip arthroplasty  ,right in Apr 20, 1998 left in 04/19/2000    bilateral  . Esophagogastroduodenoscopy  08/21/2011    Procedure: ESOPHAGOGASTRODUODENOSCOPY (EGD);  Surgeon: Lear Ng, MD;  Location: [facility] ENDOSCOPY;  Service: Endoscopy;  Laterality: N/A;  . Colonoscopy  08/21/2011    Procedure: COLONOSCOPY;  Surgeon: Lear Ng, MD;  Location: [facility] ENDOSCOPY;  Service: Endoscopy;  Laterality: N/A;  . Joint replacement    . Irrigation and debridement knee  12/23/2011  . I&d extremity  12/24/2011    Procedure: IRRIGATION AND DEBRIDEMENT EXTREMITY;  Surgeon: Sharmon Revere, MD;  Location: [place];  Service: Orthopedics;  Laterality: Right;  POSSIBLE WOUND CLOSURE  . Incision and drainage of wound  12/26/2011    Procedure: IRRIGATION AND  DEBRIDEMENT WOUND;  Surgeon: Sharmon Revere, MD;  Location: [place];  Service: Orthopedics;  Laterality: Right;  I&D of Wound Right Hip and Wound Closure  . Incision and drainage of wound Right 04/28/2012    Procedure: IRRIGATION AND DEBRIDEMENT WOUND RIGHT HIP POSSIBLE CLOSURE;  Surgeon: Sharmon Revere, MD;  Location: [place];  Service: Orthopedics;  Laterality: Right;  . Incision and drainage hip Right 04/30/2012    Procedure: IRRIGATION AND DEBRIDEMENT HIP;  Surgeon: Sharmon Revere, MD;  Location: [place];  Service: Orthopedics;  Laterality: Right;  . Incision and drainage hip Right 05/26/2012    Procedure: IRRIGATION AND DEBRIDEMENT HIP and closure of wound;  Surgeon: Sharmon Revere, MD;  Location: [place];  Service: Orthopedics;  Laterality: Right;    FAMILY HISTORY: Family History  Problem Relation Age of Onset  . Dementia Father   . High blood pressure Mother     SOCIAL HISTORY:  History   Social History  . Marital Status: Divorced    Spouse Name: N/A    Number of Children: 2  . Years of Education: 12 th   Occupational History  .      Retired   Social History Main Topics  . Smoking status: Current Every Day Smoker -- 1.00 packs/day for 57 years    Types: Cigarettes  . Smokeless tobacco: Never Used  . Alcohol Use: 1.2 oz/week    2 Cans of beer per week     Comment: weekly  . Drug Use: No  . Sexual Activity: No   Other Topics Concern  . Not on file   Social History Narrative   Lives with sister Blanch Media cell [phone] .    Patient is retired.   Education high school education.   Left handed.   Caffeine None.     PHYSICAL EXAM   Filed Vitals:   08/03/13 1039  BP: 121/66  Pulse: 101  Height: 5\' 11"  (1.803 m)  Weight: 121 lb (54.885 kg)    Not recorded    Body mass index is 16.88 kg/(m^2).   Generalized: In no acute distress  Neck: Supple, no carotid bruits   Cardiac: Regular rate rhythm  Pulmonary: Clear to auscultation bilaterally  Musculoskeletal:  No deformity, bilateral lower extremity pitting edema   Neurological examination  Mentation: no spontaneous language, depend on his sister to provide history, is not oriented to his name, Mini-Mental Status is 3 out of 30 today, he can repeat   Cranial nerve II-XII: Pupils were equal round reactive to light. Extraocular movements were full.  Blink to thread bilaterally.  Facial sensation and strength were normal. Hearing was intact to finger rubbing bilaterally. Uvula tongue midline.  Head turning and shoulder shrug and were normal and symmetric.Tongue protrusion into cheek strength was normal.  Motor: moderate limb an nuchal rigidity.  Sensory: not reliable  Coordination: no dysmetria  Gait: need assistant to get up, stiff, bradykinesia, difficulty initiate gait  Romberg signs: Negative  Deep tendon reflexes:  hypoactive and symmetric, plantar responses were flexor bilaterally.   DIAGNOSTIC DATA (LABS, IMAGING, TESTING) - I reviewed patient records, labs, notes, testing and imaging myself where available.  Lab Results  Component Value Date   WBC 4.5 07/25/2013   HGB 9.3* 07/25/2013   HCT 27.9* 07/25/2013   MCV 84.3 07/25/2013   PLT 296 07/25/2013      Component Value Date/Time   NA 135* 07/25/2013 2327   K 4.1 07/25/2013 2327   CL 98 07/25/2013 2327   CO2 23 07/25/2013 2327   GLUCOSE 106* 07/25/2013 2327   BUN 26* 07/25/2013 2327   CREATININE 1.08 07/25/2013 2327   CALCIUM 10.1 07/25/2013 2327   PROT 7.6 07/25/2013 2327   ALBUMIN 3.6 07/25/2013 2327   AST 17 07/25/2013 2327   ALT 10 07/25/2013 2327   ALKPHOS 68 07/25/2013 2327   BILITOT 0.9 07/25/2013 2327   GFRNONAA 67* 07/25/2013 2327   GFRAA 77* 07/25/2013 2327   Lab Results  Component Value Date   VITAMINB12 312 08/19/2011   Lab Results  Component Value Date   TSH 1.765 08/19/2011    ASSESSMENT AND PLAN  Nosson Wender Sylve is a 73 y.o. male  with Advanced dementia, Worsening gait difficulty, nighttime agitations, bowel bladder  incontinence,  1. Risperdal 1 mg every night 2.  [facility] also [facility] and his family, had he can get extra help at home 3. RTC in 3-4 months with Hoyle Sauer  Total time spend in coordinating his care is 60 minutes, more than 50% tongue is face-to-face consultation   Marcial Pacas, M.D. Ph.D.  [facility] [address], [place], [place] [address] [phone]

## 2013-08-03 NOTE — Telephone Encounter (Signed)
Called and spoke with Renee with [facility] will go to patients home and speak with the family for about two hours. Appt. Scheduled for august 7th at 3:oo pm. Renee's direct number [phone]. Patient sister has all information and she under stood.

## 2013-09-26 ENCOUNTER — Inpatient Hospital Stay (HOSPITAL_COMMUNITY): Payer: Medicare Other

## 2013-09-26 ENCOUNTER — Emergency Department (HOSPITAL_COMMUNITY): Payer: Medicare Other

## 2013-09-26 ENCOUNTER — Encounter (HOSPITAL_COMMUNITY): Payer: Self-pay | Admitting: Emergency Medicine

## 2013-09-26 ENCOUNTER — Inpatient Hospital Stay (HOSPITAL_COMMUNITY)
Admission: EM | Admit: 2013-09-26 | Discharge: 2013-10-02 | DRG: 559 | Disposition: A | Discharge status: Skilled Nursing Facility | Payer: Medicare Other | Attending: Internal Medicine | Admitting: Internal Medicine

## 2013-09-26 DIAGNOSIS — F17200 Nicotine dependence, unspecified, uncomplicated: Secondary | ICD-10-CM | POA: Diagnosis present

## 2013-09-26 DIAGNOSIS — S1093XA Contusion of unspecified part of neck, initial encounter: Secondary | ICD-10-CM | POA: Diagnosis present

## 2013-09-26 DIAGNOSIS — S72033A Displaced midcervical fracture of unspecified femur, initial encounter for closed fracture: Secondary | ICD-10-CM | POA: Diagnosis present

## 2013-09-26 DIAGNOSIS — D6489 Other specified anemias: Secondary | ICD-10-CM | POA: Diagnosis present

## 2013-09-26 DIAGNOSIS — Z880 Allergy status to penicillin: Secondary | ICD-10-CM | POA: Diagnosis not present

## 2013-09-26 DIAGNOSIS — I509 Heart failure, unspecified: Secondary | ICD-10-CM | POA: Diagnosis present

## 2013-09-26 DIAGNOSIS — S0083XA Contusion of other part of head, initial encounter: Secondary | ICD-10-CM | POA: Diagnosis present

## 2013-09-26 DIAGNOSIS — S0003XA Contusion of scalp, initial encounter: Secondary | ICD-10-CM | POA: Diagnosis present

## 2013-09-26 DIAGNOSIS — Z79899 Other long term (current) drug therapy: Secondary | ICD-10-CM

## 2013-09-26 DIAGNOSIS — I340 Nonrheumatic mitral (valve) insufficiency: Secondary | ICD-10-CM

## 2013-09-26 DIAGNOSIS — W19XXXA Unspecified fall, initial encounter: Secondary | ICD-10-CM | POA: Diagnosis present

## 2013-09-26 DIAGNOSIS — I080 Rheumatic disorders of both mitral and aortic valves: Secondary | ICD-10-CM | POA: Diagnosis present

## 2013-09-26 DIAGNOSIS — J4489 Other specified chronic obstructive pulmonary disease: Secondary | ICD-10-CM | POA: Diagnosis present

## 2013-09-26 DIAGNOSIS — J449 Chronic obstructive pulmonary disease, unspecified: Secondary | ICD-10-CM | POA: Diagnosis present

## 2013-09-26 DIAGNOSIS — Z9660 Presence of unspecified orthopedic joint implant: Principal | ICD-10-CM | POA: Diagnosis present

## 2013-09-26 DIAGNOSIS — Z681 Body mass index (BMI) 19 or less, adult: Secondary | ICD-10-CM

## 2013-09-26 DIAGNOSIS — Z8679 Personal history of other diseases of the circulatory system: Secondary | ICD-10-CM

## 2013-09-26 DIAGNOSIS — M25559 Pain in unspecified hip: Secondary | ICD-10-CM | POA: Diagnosis not present

## 2013-09-26 DIAGNOSIS — Z96649 Presence of unspecified artificial hip joint: Secondary | ICD-10-CM

## 2013-09-26 DIAGNOSIS — Z9181 History of falling: Secondary | ICD-10-CM | POA: Diagnosis not present

## 2013-09-26 DIAGNOSIS — S72001A Fracture of unspecified part of neck of right femur, initial encounter for closed fracture: Secondary | ICD-10-CM | POA: Diagnosis present

## 2013-09-26 DIAGNOSIS — D509 Iron deficiency anemia, unspecified: Secondary | ICD-10-CM

## 2013-09-26 DIAGNOSIS — F0390 Unspecified dementia without behavioral disturbance: Secondary | ICD-10-CM | POA: Diagnosis present

## 2013-09-26 DIAGNOSIS — E43 Unspecified severe protein-calorie malnutrition: Secondary | ICD-10-CM | POA: Diagnosis present

## 2013-09-26 DIAGNOSIS — Z8546 Personal history of malignant neoplasm of prostate: Secondary | ICD-10-CM | POA: Diagnosis not present

## 2013-09-26 DIAGNOSIS — I4891 Unspecified atrial fibrillation: Secondary | ICD-10-CM | POA: Diagnosis present

## 2013-09-26 DIAGNOSIS — Y92009 Unspecified place in unspecified non-institutional (private) residence as the place of occurrence of the external cause: Secondary | ICD-10-CM | POA: Diagnosis not present

## 2013-09-26 DIAGNOSIS — D638 Anemia in other chronic diseases classified elsewhere: Secondary | ICD-10-CM

## 2013-09-26 DIAGNOSIS — D649 Anemia, unspecified: Secondary | ICD-10-CM | POA: Diagnosis present

## 2013-09-26 DIAGNOSIS — J690 Pneumonitis due to inhalation of food and vomit: Secondary | ICD-10-CM | POA: Diagnosis present

## 2013-09-26 DIAGNOSIS — C61 Malignant neoplasm of prostate: Secondary | ICD-10-CM

## 2013-09-26 DIAGNOSIS — D689 Coagulation defect, unspecified: Secondary | ICD-10-CM | POA: Diagnosis present

## 2013-09-26 DIAGNOSIS — K089 Disorder of teeth and supporting structures, unspecified: Secondary | ICD-10-CM

## 2013-09-26 DIAGNOSIS — S72009A Fracture of unspecified part of neck of unspecified femur, initial encounter for closed fracture: Secondary | ICD-10-CM

## 2013-09-26 DIAGNOSIS — D473 Essential (hemorrhagic) thrombocythemia: Secondary | ICD-10-CM

## 2013-09-26 DIAGNOSIS — T84049A Periprosthetic fracture around unspecified internal prosthetic joint, initial encounter: Principal | ICD-10-CM | POA: Diagnosis present

## 2013-09-26 DIAGNOSIS — R64 Cachexia: Secondary | ICD-10-CM | POA: Diagnosis present

## 2013-09-26 DIAGNOSIS — R634 Abnormal weight loss: Secondary | ICD-10-CM

## 2013-09-26 LAB — BASIC METABOLIC PANEL
Anion gap: 15 (ref 5–15)
BUN: 13 mg/dL (ref 6–23)
CO2: 21 mEq/L (ref 19–32)
Calcium: 8.9 mg/dL (ref 8.4–10.5)
Chloride: 102 mEq/L (ref 96–112)
Creatinine, Ser: 0.77 mg/dL (ref 0.50–1.35)
GFR, EST NON AFRICAN AMERICAN: 89 mL/min — AB (ref >90)
GLUCOSE: 93 mg/dL (ref 70–99)
Potassium: 3.8 mEq/L (ref 3.7–5.3)
SODIUM: 138 meq/L (ref 137–147)

## 2013-09-26 LAB — CBC WITH DIFFERENTIAL/PLATELET
BASOS PCT: 0 % (ref 0–1)
Basophils Absolute: 0 10*3/uL (ref 0.0–0.1)
EOS ABS: 0 10*3/uL (ref 0.0–0.7)
EOS PCT: 0 % (ref 0–5)
HCT: 24.6 % — ABNORMAL LOW (ref 39.0–52.0)
Hemoglobin: 8.4 g/dL — ABNORMAL LOW (ref 13.0–17.0)
Lymphocytes Relative: 7 % — ABNORMAL LOW (ref 12–46)
Lymphs Abs: 0.5 10*3/uL — ABNORMAL LOW (ref 0.7–4.0)
MCH: 29.4 pg (ref 26.0–34.0)
MCHC: 34.1 g/dL (ref 30.0–36.0)
MCV: 86 fL (ref 78.0–100.0)
Monocytes Absolute: 0.6 10*3/uL (ref 0.1–1.0)
Monocytes Relative: 9 % (ref 3–12)
Neutro Abs: 6.1 10*3/uL (ref 1.7–7.7)
Neutrophils Relative %: 84 % — ABNORMAL HIGH (ref 43–77)
Platelets: 356 10*3/uL (ref 150–400)
RBC: 2.86 MIL/uL — ABNORMAL LOW (ref 4.22–5.81)
RDW: 13.5 % (ref 11.5–15.5)
WBC: 7.2 10*3/uL (ref 4.0–10.5)

## 2013-09-26 LAB — PROTIME-INR
INR: 1.58 — ABNORMAL HIGH (ref 0.00–1.49)
PROTHROMBIN TIME: 18.9 s — AB (ref 11.6–15.2)

## 2013-09-26 MED ORDER — HYDROCODONE-ACETAMINOPHEN 5-325 MG PO TABS: 1.0000 | ORAL_TABLET | ORAL | Status: DC | PRN

## 2013-09-26 MED ORDER — MORPHINE SULFATE 4 MG/ML IJ SOLN: 4.0000 mg | INTRAMUSCULAR | Status: DC | PRN

## 2013-09-26 MED ORDER — HYDROMORPHONE HCL 1 MG/ML IJ SOLN
0.5000 mg | INTRAMUSCULAR | Status: AC
  Administered 2013-09-26: 0.5 mg via INTRAVENOUS
  Filled 2013-09-26: qty 1

## 2013-09-26 MED ORDER — ONDANSETRON HCL 4 MG/2ML IJ SOLN: 4.0000 mg | Freq: Three times a day (TID) | INTRAMUSCULAR | Status: DC | PRN

## 2013-09-26 NOTE — H&P (Signed)
[facility] History and Physical  Chris [name] FSE:[id number] DOB: [birth year] DOA: 09/26/2013  Referring physician: ER physician. PCP: Vonna Drafts., FNP   Chief Complaint: Fall.  History obtained from patient's sister. Patient has dementia.  HPI: Chris [name] is a 73 y.o. male with history of dementia, atrial fibrillation, COPD was brought to the ER after patient had a fall at his house. Patient was found to flow by patient's family member. As per the family member patient was not unconscious. Patient had a small bruise on his scalp and CT of the head is pending at this time. X-ray show right femur fracture the greater trochanteric area. Patient has had previous infection of the right hip last year with nonhealing wound. Patient was found to have mild bleeding from the wound site initially on arrival. Patient is demented and does not contribute to history. Patient's sister states that patient has been having frequent falls recently. Patient also was recently assessed by neurologist in July for dementia.   Review of Systems: As presented in the history of presenting illness, rest negative.  Past Medical History  Diagnosis Date  . Poor dentition   . COPD (chronic obstructive pulmonary disease)   . CHF (congestive heart failure)   . History of blood transfusion 08/19/2011    "first time"  . Prostate cancer 2011    seed implant  . Atrial flutter     h/o; successful cardioversion,TEE guided  . Iron deficiency anemia     HX BLOOD TX  . Aortic valve regurgitation     moderate AR by echo 2010  . Mitral valve regurgitation     moderate to severe by 2D, mod by TEE '10  . Dementia    Past Surgical History  Procedure Laterality Date  . 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 Esophagogastroduodenoscopy  08/21/2011    Procedure: ESOPHAGOGASTRODUODENOSCOPY (EGD);  Surgeon: Lear Ng, MD;  Location: [facility] ENDOSCOPY;  Service: Endoscopy;   Laterality: N/A;  . Colonoscopy  08/21/2011    Procedure: COLONOSCOPY;  Surgeon: Lear Ng, MD;  Location: [facility] ENDOSCOPY;  Service: Endoscopy;  Laterality: N/A;  . Joint replacement    . Irrigation and debridement knee  12/23/2011  . I&d extremity  12/24/2011    Procedure: IRRIGATION AND DEBRIDEMENT EXTREMITY;  Surgeon: Sharmon Revere, MD;  Location: [place];  Service: Orthopedics;  Laterality: Right;  POSSIBLE WOUND CLOSURE  . Incision and drainage of wound  12/26/2011    Procedure: IRRIGATION AND DEBRIDEMENT WOUND;  Surgeon: Sharmon Revere, MD;  Location: [place];  Service: Orthopedics;  Laterality: Right;  I&D of Wound Right Hip and Wound Closure  . Incision and drainage of wound Right 04/28/2012    Procedure: IRRIGATION AND DEBRIDEMENT WOUND RIGHT HIP POSSIBLE CLOSURE;  Surgeon: Sharmon Revere, MD;  Location: [place];  Service: Orthopedics;  Laterality: Right;  . Incision and drainage hip Right 04/30/2012    Procedure: IRRIGATION AND DEBRIDEMENT HIP;  Surgeon: Sharmon Revere, MD;  Location: [place];  Service: Orthopedics;  Laterality: Right;  . Incision and drainage hip Right 05/26/2012    Procedure: IRRIGATION AND DEBRIDEMENT HIP and closure of wound;  Surgeon: Sharmon Revere, MD;  Location: [place];  Service: Orthopedics;  Laterality: Right;   Social History:  reports that he has been smoking Cigarettes.  He has a 57 pack-year smoking history. He has never used smokeless tobacco. He reports that he drinks  about 1.2 ounces of alcohol per week. He reports that he does not use illicit drugs. Where does patient live home. Can patient participate in ADLs? No.  Allergies  Allergen Reactions  . Penicillins Nausea And Vomiting    Family History:  Family History  Problem Relation Age of Onset  . Dementia Father   . High blood pressure Mother       Prior to Admission medications   Medication Sig Start Date End Date Taking? Authorizing Provider  cholecalciferol (VITAMIN D) 400 UNITS TABS  tablet Take 400 Units by mouth daily.   Yes Historical Provider, MD  etodolac (LODINE) 400 MG tablet Take 400 mg by mouth daily.  07/21/13  Yes Historical Provider, MD  furosemide (LASIX) 40 MG tablet Take 40 mg by mouth daily. 09/05/13  Yes Historical Provider, MD  KLOR-CON M20 20 MEQ tablet Take 20 mEq by mouth daily. 09/05/13  Yes Historical Provider, MD  zolpidem (AMBIEN) 5 MG tablet Take 5 mg by mouth at bedtime. 09/25/13  Yes Historical Provider, MD    Physical Exam: Filed Vitals:   09/26/13 1714 09/26/13 2000  BP: 120/53 132/61  Pulse: 113 116  Temp: 98.7 F (37.1 C)   TempSrc: Oral   Resp: 15 16  SpO2: 100% 100%     General:  Well built and moderately nourished.  Eyes: Anicteric no pallor.  ENT: Small bruise on his scalp.  Neck: No JVD appreciated.  Cardiovascular: S1-S2 heard.  Respiratory: No rhonchi or crepitations.  Abdomen: Soft nontender bowel sound present.  Skin: Small bruise on the scalp.  Musculoskeletal: There is small wound of the right hip with no active bleed and pain on moving right hip.  Psychiatric: Patient is demented.  Neurologic: Patient is alert awake and follows commands.  Labs on Admission:  Basic Metabolic Panel:  Recent Labs Lab 09/26/13 1921  NA 138  K 3.8  CL 102  CO2 21  GLUCOSE 93  BUN 13  CREATININE 0.77  CALCIUM 8.9   Liver Function Tests: No results found for this basename: AST, ALT, ALKPHOS, BILITOT, PROT, ALBUMIN,  in the last 168 hours No results found for this basename: LIPASE, AMYLASE,  in the last 168 hours No results found for this basename: AMMONIA,  in the last 168 hours CBC:  Recent Labs Lab 09/26/13 1921  WBC 7.2  NEUTROABS 6.1  HGB 8.4*  HCT 24.6*  MCV 86.0  PLT 356   Cardiac Enzymes: No results found for this basename: CKTOTAL, CKMB, CKMBINDEX, TROPONINI,  in the last 168 hours  BNP (last 3 results) No results found for this basename: PROBNP,  in the last 8760 hours CBG: No results found for  this basename: GLUCAP,  in the last 168 hours  Radiological Exams on Admission: Dg Shoulder Right  09/26/2013   CLINICAL DATA:  Fall, right shoulder pain with in mobility  EXAM: RIGHT SHOULDER - 2+ VIEW  COMPARISON:  None.  FINDINGS: Mild acromioclavicular joint hypertrophy. Sub acromial spurring noted. Degenerative change at the cuff tendon insertion site on the greater tuberosity. No evidence of fracture or dislocation.  IMPRESSION: No acute findings   Electronically Signed   By: Skipper Cliche M.D.   On: 09/26/2013 21:23   Dg Hip Complete Right  09/26/2013   CLINICAL DATA:  Recent fall with right hip pain  EXAM: RIGHT HIP - COMPLETE 2+ VIEW  COMPARISON:  02/08/2013  FINDINGS: There is a new fracture in the proximal right femur surrounding the previously placed  hip prosthesis. It involves predominantly the greater trochanter. No dislocation of the femoral prosthesis is noted. Chronic changes about the superior pubic rami on the left are noted. A left hip prosthesis is noted as well.  IMPRESSION: Fracture of the proximal right femur in the greater trochanter region surrounding the known prosthesis.   Electronically Signed   By: Inez Catalina M.D.   On: 09/26/2013 18:57    EKG: Independently reviewed. Sinus tachycardia.  Assessment/Plan Principal Problem:   Closed right hip fracture Active Problems:   H/O CHF- normal LVEF per ECHO 2011   COPD (chronic obstructive pulmonary disease)   Dementia   Anemia, normocytic normochromic   1. Right hip fracture with bone on the right hip area status post fall - CT head is pending. Dr. Arnell Sieving on-call orthopedic surgeon who knows the patient well and at this time they will be discussed with patient about further management if patient would need surgery or not. In anticipation of possible surgical procedure patient will be kept n.p.o. past midnight. Since patient has had previous infection of the right hip and there is a wound which was seen bleeding I have  placed patient on empiric antibiotics for now. Further recommendations orthopedic surgery. 2. Chronic anemia normocytic normochromic - closely follow CBC. Type and screen and hold. 3. Mild coagulopathy - have ordered one dose of by mouth vitamin K 2.5 mg. Recheck INR in a.m. 4. History of dementia - no acute issues at this time. 5. History of atrial fibrillation - presently in sinus rhythm. Not on anticoagulants. 6. History of COPD - presently not wheezing.    Code Status: Full code.  Family Communication: Patient's sister.  Disposition Plan: Admit to inpatient.    Bernedette Auston N. [facility] Pager [phone].  If 7PM-7AM, please contact night-coverage [email or web address] Password [facility] 09/26/2013, 11:19 PM

## 2013-09-26 NOTE — ED Notes (Signed)
Pager [phone]

## 2013-09-26 NOTE — ED Provider Notes (Signed)
CSN: [id number]     Arrival date & time 09/26/13  1658 History   First MD Initiated Contact with Patient 09/26/13 1856     Chief Complaint  Patient presents with  . Fall   (Consider location/radiation/quality/duration/timing/severity/associated sxs/prior Treatment) HPI RAYNER ERMAN is a 73 to male presenting to the ED after a fall this am from the bed.  Family states he gets up without assistance and they heard him fall this am around 4 am.  He was assisted back to bed by his nephew.  He was able to stand on his left leg but they noticed he was in pain when standing on the right.  They also noticed a wound from an old surgery had opened up and was bleeding today.  He also has a lac his head but his mental status is unchanged from his baseline.    Past Medical History  Diagnosis Date  . Poor dentition   . COPD (chronic obstructive pulmonary disease)   . CHF (congestive heart failure)   . History of blood transfusion 08/19/2011    "first time"  . Prostate cancer 2011    seed implant  . Atrial flutter     h/o; successful cardioversion,TEE guided  . Iron deficiency anemia     HX BLOOD TX  . Aortic valve regurgitation     moderate AR by echo 2010  . Mitral valve regurgitation     moderate to severe by 2D, mod by TEE '10  . Dementia    Past Surgical History  Procedure Laterality Date  . 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 Esophagogastroduodenoscopy  08/21/2011    Procedure: ESOPHAGOGASTRODUODENOSCOPY (EGD);  Surgeon: Lear Ng, MD;  Location: [facility] ENDOSCOPY;  Service: Endoscopy;  Laterality: N/A;  . Colonoscopy  08/21/2011    Procedure: COLONOSCOPY;  Surgeon: Lear Ng, MD;  Location: [facility] ENDOSCOPY;  Service: Endoscopy;  Laterality: N/A;  . Joint replacement    . Irrigation and debridement knee  12/23/2011  . I&d extremity  12/24/2011    Procedure: IRRIGATION AND DEBRIDEMENT EXTREMITY;  Surgeon: Sharmon Revere, MD;  Location: [place];   Service: Orthopedics;  Laterality: Right;  POSSIBLE WOUND CLOSURE  . Incision and drainage of wound  12/26/2011    Procedure: IRRIGATION AND DEBRIDEMENT WOUND;  Surgeon: Sharmon Revere, MD;  Location: [place];  Service: Orthopedics;  Laterality: Right;  I&D of Wound Right Hip and Wound Closure  . Incision and drainage of wound Right 04/28/2012    Procedure: IRRIGATION AND DEBRIDEMENT WOUND RIGHT HIP POSSIBLE CLOSURE;  Surgeon: Sharmon Revere, MD;  Location: [place];  Service: Orthopedics;  Laterality: Right;  . Incision and drainage hip Right 04/30/2012    Procedure: IRRIGATION AND DEBRIDEMENT HIP;  Surgeon: Sharmon Revere, MD;  Location: [place];  Service: Orthopedics;  Laterality: Right;  . Incision and drainage hip Right 05/26/2012    Procedure: IRRIGATION AND DEBRIDEMENT HIP and closure of wound;  Surgeon: Sharmon Revere, MD;  Location: [place];  Service: Orthopedics;  Laterality: Right;   Family History  Problem Relation Age of Onset  . Dementia Father   . High blood pressure Mother    History  Substance Use Topics  . Smoking status: Current Every Day Smoker -- 1.00 packs/day for 57 years    Types: Cigarettes  . Smokeless tobacco: Never Used  . Alcohol Use: 1.2 oz/week    2 Cans of beer  per week     Comment: weekly    Review of Systems  Unable to perform ROS: Dementia    Allergies  Penicillins  Home Medications   Prior to Admission medications   Medication Sig Start Date End Date Taking? Authorizing Provider  cholecalciferol (VITAMIN D) 400 UNITS TABS tablet Take 400 Units by mouth daily.    Historical Provider, MD  etodolac (LODINE) 400 MG tablet 400 mg daily. 07/21/13   Historical Provider, MD  HYDROcodone-acetaminophen (NORCO) 10-325 MG per tablet Take 1 tablet by mouth 2 (two) times daily as needed for moderate pain.     Historical Provider, MD  risperiDONE (RISPERDAL) 1 MG tablet Take 1 tablet (1 mg total) by mouth at bedtime. 08/03/13   Marcial Pacas, MD   BP 120/53  Pulse  113  Temp(Src) 98.7 F (37.1 C) (Oral)  Resp 15  SpO2 100% Physical Exam  Nursing note and vitals reviewed. Constitutional: He appears cachectic. No distress.  HENT:  Head: Normocephalic.    Mouth/Throat: Oropharynx is clear and moist. No oropharyngeal exudate.  Eyes: Conjunctivae are normal.  Neck: Neck supple. No thyromegaly present.  Cardiovascular: Regular rhythm and intact distal pulses.  Tachycardia present.   Pulmonary/Chest: Effort normal and breath sounds normal. No respiratory distress. He has no wheezes. He has no rales. He exhibits no tenderness.  Abdominal: Soft. There is no tenderness.  Musculoskeletal:       Right shoulder: He exhibits decreased range of motion and tenderness. He exhibits no deformity.       Right hip: He exhibits decreased strength, tenderness, bony tenderness and deformity.       Legs: Lymphadenopathy:    He has no cervical adenopathy.  Neurological: He is alert.  Skin: Skin is warm and dry. No rash noted. He is not diaphoretic.     Psychiatric: He has a normal mood and affect.    ED Course  Procedures (including critical care time) Labs Review Labs Reviewed  BASIC METABOLIC PANEL - Abnormal; Notable for the following:    GFR calc non Af Amer 89 (*)    All other components within normal limits  CBC WITH DIFFERENTIAL - Abnormal; Notable for the following:    RBC 2.86 (*)    Hemoglobin 8.4 (*)    HCT 24.6 (*)    Neutrophils Relative % 84 (*)    Lymphocytes Relative 7 (*)    Lymphs Abs 0.5 (*)    All other components within normal limits  PROTIME-INR - Abnormal; Notable for the following:    Prothrombin Time 18.9 (*)    INR 1.58 (*)    All other components within normal limits  TYPE AND SCREEN    Imaging Review Dg Shoulder Right  09/26/2013   CLINICAL DATA:  Fall, right shoulder pain with in mobility  EXAM: RIGHT SHOULDER - 2+ VIEW  COMPARISON:  None.  FINDINGS: Mild acromioclavicular joint hypertrophy. Sub acromial spurring noted.  Degenerative change at the cuff tendon insertion site on the greater tuberosity. No evidence of fracture or dislocation.  IMPRESSION: No acute findings   Electronically Signed   By: Skipper Cliche M.D.   On: 09/26/2013 21:23   Dg Hip Complete Right  09/26/2013   CLINICAL DATA:  Recent fall with right hip pain  EXAM: RIGHT HIP - COMPLETE 2+ VIEW  COMPARISON:  02/08/2013  FINDINGS: There is a new fracture in the proximal right femur surrounding the previously placed hip prosthesis. It involves predominantly the greater trochanter. No dislocation  of the femoral prosthesis is noted. Chronic changes about the superior pubic rami on the left are noted. A left hip prosthesis is noted as well.  IMPRESSION: Fracture of the proximal right femur in the greater trochanter region surrounding the known prosthesis.   Electronically Signed   By: Inez Catalina M.D.   On: 09/26/2013 18:57     EKG Interpretation   Date/Time:  Wednesday September 27 2013 00:32:32 EDT Ventricular Rate:  106 PR Interval:  63 QRS Duration: 72 QT Interval:  394 QTC Calculation: 523 R Axis:   88 Text Interpretation:  Sinus tachycardia Ventricular premature complex  Consider right atrial enlargement Borderline right axis deviation  Borderline repolarization abnormality Prolonged QT interval Artifact in  lead(s) I aVL aVF V1 V3 V6 ED PHYSICIAN INTERPRETATION AVAILABLE IN [facility] Confirmed by TEST, Record ([id number]) on 09/29/2013 7:17:59 AM      MDM   Final diagnoses:  Hip fracture, right, closed, initial encounter  Dementia, without behavioral disturbance  Chronic anemia   73 yo male fell this am from bed.  Rt leg shortened and rotated, hip fx on xray. Rt shoulder xray negative for fracture or dislocation.  Small lac to head, no repair necessary.  IV pain meds, CBC: mild anemia. BMP: without significant abnormality. Pain managed in the ED  Spoke to Dr. Eulas Post who will consult,  He will be admitted to medicine service.      Filed Vitals:   09/26/13 1714 09/26/13 2000  BP: 120/53 132/61  Pulse: 113 116  Temp: 98.7 F (37.1 C)   TempSrc: Oral   Resp: 15 16  SpO2: 100% 100%    Meds given in ED:  Medications  HYDROcodone-acetaminophen (NORCO/VICODIN) 5-325 MG per tablet 1-2 tablet (not administered)  ondansetron (ZOFRAN) injection 4 mg (not administered)  morphine 4 MG/ML injection 4 mg (not administered)  HYDROmorphone (DILAUDID) injection 0.5 mg (0.5 mg Intravenous Given 09/26/13 2053)    Current Discharge Medication List         Britt Bottom, NP 09/30/13 0011

## 2013-09-26 NOTE — ED Notes (Signed)
Per family, pt has dementia and he fell this morning on his right hip. Pain to right hip also has hip replacement to this hip, with small open area the size of a pencil. Reports pt is at baseline with his mentation but is not moving around as much. Also hit head, has small laceration to right side of head.

## 2013-09-26 NOTE — ED Notes (Signed)
Two nurses attempted IV with no success. IV team paged.

## 2013-09-27 ENCOUNTER — Telehealth: Payer: Self-pay | Admitting: Neurology

## 2013-09-27 ENCOUNTER — Inpatient Hospital Stay (HOSPITAL_COMMUNITY): Payer: Medicare Other

## 2013-09-27 ENCOUNTER — Encounter (HOSPITAL_COMMUNITY): Payer: Self-pay | Admitting: Radiology

## 2013-09-27 DIAGNOSIS — D638 Anemia in other chronic diseases classified elsewhere: Secondary | ICD-10-CM

## 2013-09-27 DIAGNOSIS — D473 Essential (hemorrhagic) thrombocythemia: Secondary | ICD-10-CM

## 2013-09-27 DIAGNOSIS — Z8679 Personal history of other diseases of the circulatory system: Secondary | ICD-10-CM

## 2013-09-27 DIAGNOSIS — D649 Anemia, unspecified: Secondary | ICD-10-CM

## 2013-09-27 LAB — COMPREHENSIVE METABOLIC PANEL
ALT: 13 U/L (ref 0–53)
ANION GAP: 14 (ref 5–15)
AST: 34 U/L (ref 0–37)
Albumin: 2.8 g/dL — ABNORMAL LOW (ref 3.5–5.2)
Alkaline Phosphatase: 89 U/L (ref 39–117)
BUN: 11 mg/dL (ref 6–23)
CALCIUM: 8.7 mg/dL (ref 8.4–10.5)
CO2: 21 mEq/L (ref 19–32)
Chloride: 102 mEq/L (ref 96–112)
Creatinine, Ser: 0.73 mg/dL (ref 0.50–1.35)
GFR calc non Af Amer: >90 mL/min (ref >90)
Glucose, Bld: 88 mg/dL (ref 70–99)
Potassium: 3.7 mEq/L (ref 3.7–5.3)
SODIUM: 137 meq/L (ref 137–147)
Total Bilirubin: 0.8 mg/dL (ref 0.3–1.2)
Total Protein: 6.5 g/dL (ref 6.0–8.3)

## 2013-09-27 LAB — URINE MICROSCOPIC-ADD ON

## 2013-09-27 LAB — CBC WITH DIFFERENTIAL/PLATELET
Basophils Absolute: 0 10*3/uL (ref 0.0–0.1)
Basophils Relative: 0 % (ref 0–1)
Eosinophils Absolute: 0 10*3/uL (ref 0.0–0.7)
Eosinophils Relative: 0 % (ref 0–5)
HCT: 24 % — ABNORMAL LOW (ref 39.0–52.0)
Hemoglobin: 8.1 g/dL — ABNORMAL LOW (ref 13.0–17.0)
Lymphocytes Relative: 16 % (ref 12–46)
Lymphs Abs: 0.9 10*3/uL (ref 0.7–4.0)
MCH: 29.1 pg (ref 26.0–34.0)
MCHC: 33.8 g/dL (ref 30.0–36.0)
MCV: 86.3 fL (ref 78.0–100.0)
Monocytes Absolute: 0.6 10*3/uL (ref 0.1–1.0)
Monocytes Relative: 10 % (ref 3–12)
Neutro Abs: 4.1 10*3/uL (ref 1.7–7.7)
Neutrophils Relative %: 74 % (ref 43–77)
Platelets: 341 10*3/uL (ref 150–400)
RBC: 2.78 MIL/uL — ABNORMAL LOW (ref 4.22–5.81)
RDW: 13.5 % (ref 11.5–15.5)
WBC: 5.6 10*3/uL (ref 4.0–10.5)

## 2013-09-27 LAB — URINALYSIS, ROUTINE W REFLEX MICROSCOPIC
Bilirubin Urine: NEGATIVE
GLUCOSE, UA: NEGATIVE mg/dL
Hgb urine dipstick: NEGATIVE
Ketones, ur: NEGATIVE mg/dL
Nitrite: NEGATIVE
PH: 5.5 (ref 5.0–8.0)
Protein, ur: NEGATIVE mg/dL
Specific Gravity, Urine: 1.019 (ref 1.005–1.030)
Urobilinogen, UA: 0.2 mg/dL (ref 0.0–1.0)

## 2013-09-27 LAB — SURGICAL PCR SCREEN
MRSA, PCR: NEGATIVE
STAPHYLOCOCCUS AUREUS: NEGATIVE

## 2013-09-27 LAB — PROTIME-INR
INR: 1.99 — AB (ref 0.00–1.49)
PROTHROMBIN TIME: 22.6 s — AB (ref 11.6–15.2)

## 2013-09-27 MED ORDER — THIAMINE HCL 100 MG/ML IJ SOLN
100.0000 mg | Freq: Every day | INTRAMUSCULAR | Status: DC
  Administered 2013-09-27: 100 mg via INTRAVENOUS
  Filled 2013-09-27 (×3): qty 1

## 2013-09-27 MED ORDER — DEXTROSE-NACL 5-0.45 % IV SOLN
INTRAVENOUS | Status: DC
  Administered 2013-09-27 – 2013-09-28 (×2): via INTRAVENOUS

## 2013-09-27 MED ORDER — INFLUENZA VAC SPLIT QUAD 0.5 ML IM SUSY
0.5000 mL | PREFILLED_SYRINGE | INTRAMUSCULAR | Status: AC
  Administered 2013-09-30: 0.5 mL via INTRAMUSCULAR
  Filled 2013-09-27: qty 0.5

## 2013-09-27 MED ORDER — PHYTONADIONE 5 MG PO TABS
2.5000 mg | ORAL_TABLET | Freq: Once | ORAL | Status: DC
  Filled 2013-09-27: qty 1

## 2013-09-27 MED ORDER — VITAMIN K1 10 MG/ML IJ SOLN
5.0000 mg | Freq: Once | INTRAMUSCULAR | Status: AC
  Administered 2013-09-27: 5 mg via SUBCUTANEOUS
  Filled 2013-09-27 (×2): qty 0.5

## 2013-09-27 MED ORDER — HYDROCODONE-ACETAMINOPHEN 5-325 MG PO TABS
1.0000 | ORAL_TABLET | Freq: Four times a day (QID) | ORAL | Status: DC | PRN
  Administered 2013-09-27: 1 via ORAL
  Administered 2013-09-28 – 2013-10-02 (×4): 2 via ORAL
  Filled 2013-09-27 (×2): qty 2
  Filled 2013-09-27: qty 1
  Filled 2013-09-27 (×2): qty 2

## 2013-09-27 MED ORDER — BOOST PLUS PO LIQD
237.0000 mL | Freq: Three times a day (TID) | ORAL | Status: DC
  Administered 2013-09-27 – 2013-10-01 (×6): 237 mL via ORAL
  Administered 2013-10-01: 237 via ORAL
  Administered 2013-10-01 – 2013-10-02 (×3): 237 mL via ORAL
  Filled 2013-09-27 (×20): qty 237

## 2013-09-27 MED ORDER — MORPHINE SULFATE 2 MG/ML IJ SOLN
0.5000 mg | INTRAMUSCULAR | Status: DC | PRN
  Administered 2013-09-27: 0.5 mg via INTRAVENOUS
  Filled 2013-09-27: qty 1

## 2013-09-27 MED ORDER — VANCOMYCIN HCL IN DEXTROSE 1-5 GM/200ML-% IV SOLN
1000.0000 mg | Freq: Once | INTRAVENOUS | Status: AC
  Administered 2013-09-27: 1000 mg via INTRAVENOUS
  Filled 2013-09-27: qty 200

## 2013-09-27 MED ORDER — SODIUM CHLORIDE 0.9 % IV SOLN
INTRAVENOUS | Status: DC
  Administered 2013-09-27 – 2013-09-29 (×2): via INTRAVENOUS

## 2013-09-27 MED ORDER — VANCOMYCIN HCL IN DEXTROSE 750-5 MG/150ML-% IV SOLN
750.0000 mg | INTRAVENOUS | Status: DC
  Administered 2013-09-28 – 2013-10-01 (×4): 750 mg via INTRAVENOUS
  Filled 2013-09-27 (×5): qty 150

## 2013-09-27 MED ORDER — ZOLPIDEM TARTRATE 5 MG PO TABS
5.0000 mg | ORAL_TABLET | Freq: Every day | ORAL | Status: DC
  Administered 2013-09-27 – 2013-10-01 (×3): 5 mg via ORAL
  Filled 2013-09-27 (×3): qty 1

## 2013-09-27 NOTE — Telephone Encounter (Signed)
Cleon Dew calling from [facility] to state that patient came in for [facility] and that he is medically unstable right now and they would like to delay enrollment. Joseph Art says that patient's wife called and states that patient broke his hip last night and is currently in the hospital, waiting to hear whether the doctor is going to operate. Please return call and advise.

## 2013-09-27 NOTE — Progress Notes (Addendum)
INITIAL NUTRITION ASSESSMENT  Patient meets the criteria for severe MALNUTRITION in the context of chronic illness with 25% weight loss in 6 months, PO intake <75% of estimated needs, and severe muscle and fat depletion  DOCUMENTATION CODES Per approved criteria  -Severe malnutrition in the context of chronic illness -Underweight   INTERVENTION: -Boost TID between meals, each supplement provides 360 kcal, 14 g protein -Encourage adequate PO intake  NUTRITION DIAGNOSIS: Malnutrition related to decreased PO intake as evidenced by 25% weight loss in 6 months and severe fat and muscle depletion.   Goal: Patient will meet >/=90% of estimated nutrition needs  Monitor:  Diet advancement, PO intake, weight, labs, I/Os  Reason for Assessment: Consult, Hip Fracture Protocol  73 y.o. male  Admitting Dx: Closed right hip fracture  ASSESSMENT: 73 year old male with history of dementia, atrial fibrillation, COPD, brought into ER after patient had a fall at his house.   Patient with right hip fracture. Possible operative repair.   Unable to obtain history from patient due to dementia, family in room provided information. Patient had stopped eating about 6 months ago, which resulted in weight loss of 25%. He had started eating again 1 month ago, and weight has been stable. His sister reports that she had been giving him Boost shakes at home.   Nutrition Focused Physical Exam:  Subcutaneous Fat:  Orbital Region: Severe depletion Upper Arm Region: Severe depletion Thoracic and Lumbar Region: Severe depletion  Muscle:  Temple Region: Severe depletion Clavicle Bone Region: Severe depletion Clavicle and Acromion Bone Region: Severe depletion Scapular Bone Region: Severe depletion Dorsal Hand: Severe depletion Patellar Region: Severe depletion Anterior Thigh Region: Severe depletion Posterior Calf Region: Severe depletion  Edema: none    Height: Ht Readings from Last 1 Encounters:   09/27/13 5' 10.87" (1.8 m)    Weight: Wt Readings from Last 1 Encounters:  09/27/13 121 lb 4.1 oz (55 kg)    Ideal Body Weight: 172 pounds  % Ideal Body Weight: 70%  Wt Readings from Last 10 Encounters:  09/27/13 121 lb 4.1 oz (55 kg)  08/03/13 121 lb (54.885 kg)  07/10/13 126 lb (57.153 kg)  06/10/13 126 lb 5 oz (57.295 kg)  05/25/12 162 lb (73.483 kg)  04/21/12 167 lb 12.3 oz (76.099 kg)  12/22/11 153 lb 3.2 oz (69.491 kg)  08/20/11 133 lb 3.2 oz (60.419 kg)  08/20/11 133 lb 3.2 oz (60.419 kg)    Usual Body Weight: 162 pounds  % Usual Body Weight: 75%   BMI:  Body mass index is 16.98 kg/(m^2). Patient is underweight.   Estimated Nutritional Needs: Kcal: 1700-1900 kcal Protein: 75-90 g Fluid: >1.9 L/day  Skin: intact  Diet Order: General  EDUCATION NEEDS: -No education needs identified at this time   Intake/Output Summary (Last 24 hours) at 09/27/13 1441 Last data filed at 09/27/13 1100  Gross per 24 hour  Intake    120 ml  Output      0 ml  Net    120 ml    Last BM: PTA   Labs:   Recent Labs Lab 09/26/13 1921 09/27/13 0620  NA 138 137  K 3.8 3.7  CL 102 102  CO2 21 21  BUN 13 11  CREATININE 0.77 0.73  CALCIUM 8.9 8.7  GLUCOSE 93 88    CBG (last 3)  No results found for this basename: GLUCAP,  in the last 72 hours  Scheduled Meds: . phytonadione  2.5 mg Oral Once  .  thiamine IV  100 mg Intravenous Daily  . [START ON 09/28/2013] vancomycin  750 mg Intravenous Q24H  . zolpidem  5 mg Oral QHS    Continuous Infusions: . sodium chloride 10 mL/hr at 09/27/13 0141  . dextrose 5 % and 0.45% NaCl 75 mL/hr at 09/27/13 1331    Past Medical History  Diagnosis Date  . Poor dentition   . COPD (chronic obstructive pulmonary disease)   . CHF (congestive heart failure)   . History of blood transfusion 08/19/2011    "first time"  . Prostate cancer 2011    seed implant  . Atrial flutter     h/o; successful cardioversion,TEE guided  . Iron  deficiency anemia     HX BLOOD TX  . Aortic valve regurgitation     moderate AR by echo 2010  . Mitral valve regurgitation     moderate to severe by 2D, mod by TEE '10  . Dementia     Past Surgical History  Procedure Laterality Date  . 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 Esophagogastroduodenoscopy  08/21/2011    Procedure: ESOPHAGOGASTRODUODENOSCOPY (EGD);  Surgeon: Lear Ng, MD;  Location: [facility] ENDOSCOPY;  Service: Endoscopy;  Laterality: N/A;  . Colonoscopy  08/21/2011    Procedure: COLONOSCOPY;  Surgeon: Lear Ng, MD;  Location: [facility] ENDOSCOPY;  Service: Endoscopy;  Laterality: N/A;  . Joint replacement    . Irrigation and debridement knee  12/23/2011  . I&d extremity  12/24/2011    Procedure: IRRIGATION AND DEBRIDEMENT EXTREMITY;  Surgeon: Sharmon Revere, MD;  Location: [place];  Service: Orthopedics;  Laterality: Right;  POSSIBLE WOUND CLOSURE  . Incision and drainage of wound  12/26/2011    Procedure: IRRIGATION AND DEBRIDEMENT WOUND;  Surgeon: Sharmon Revere, MD;  Location: [place];  Service: Orthopedics;  Laterality: Right;  I&D of Wound Right Hip and Wound Closure  . Incision and drainage of wound Right 04/28/2012    Procedure: IRRIGATION AND DEBRIDEMENT WOUND RIGHT HIP POSSIBLE CLOSURE;  Surgeon: Sharmon Revere, MD;  Location: [place];  Service: Orthopedics;  Laterality: Right;  . Incision and drainage hip Right 04/30/2012    Procedure: IRRIGATION AND DEBRIDEMENT HIP;  Surgeon: Sharmon Revere, MD;  Location: [place];  Service: Orthopedics;  Laterality: Right;  . Incision and drainage hip Right 05/26/2012    Procedure: IRRIGATION AND DEBRIDEMENT HIP and closure of wound;  Surgeon: Sharmon Revere, MD;  Location: [place];  Service: Orthopedics;  Laterality: Right;    Larey Seat, RD, LDN Pager #: [phone] After-Hours Pager #: [phone]

## 2013-09-27 NOTE — Progress Notes (Signed)
ANTIBIOTIC CONSULT NOTE - INITIAL  Pharmacy Consult for Vancomycin  Indication: possible wound infection  Allergies  Allergen Reactions  . Penicillins Nausea And Vomiting    Patient Measurements: Height: 5' 10.87" (180 cm) Weight: 121 lb 4.1 oz (55 kg) (07/2013) IBW/kg (Calculated) : 74.99  Vital Signs: Temp: 98.7 F (37.1 C) (09/22 1714) Temp src: Oral (09/22 1714) BP: 132/61 mmHg (09/22 2000) Pulse Rate: 116 (09/22 2000) Intake/Output from previous day:   Intake/Output from this shift:    Labs:  Recent Labs  09/26/13 1921  WBC 7.2  HGB 8.4*  PLT 356  CREATININE 0.77   Estimated Creatinine Clearance: 64.9 ml/min (by C-G formula based on Cr of 0.77). No results found for this basename: VANCOTROUGH, VANCOPEAK, VANCORANDOM, GENTTROUGH, GENTPEAK, GENTRANDOM, TOBRATROUGH, TOBRAPEAK, TOBRARND, AMIKACINPEAK, AMIKACINTROU, AMIKACIN,  in the last 72 hours   Microbiology: No results found for this or any previous visit (from the past 720 hour(s)).  Medical History: Past Medical History  Diagnosis Date  . Poor dentition   . COPD (chronic obstructive pulmonary disease)   . CHF (congestive heart failure)   . History of blood transfusion 08/19/2011    "first time"  . Prostate cancer 2011    seed implant  . Atrial flutter     h/o; successful cardioversion,TEE guided  . Iron deficiency anemia     HX BLOOD TX  . Aortic valve regurgitation     moderate AR by echo 2010  . Mitral valve regurgitation     moderate to severe by 2D, mod by TEE '10  . Dementia     Medications:  Prescriptions prior to admission  Medication Sig Dispense Refill  . cholecalciferol (VITAMIN D) 400 UNITS TABS tablet Take 400 Units by mouth daily.      Marland Kitchen etodolac (LODINE) 400 MG tablet Take 400 mg by mouth daily.       . furosemide (LASIX) 40 MG tablet Take 40 mg by mouth daily.      Marland Kitchen KLOR-CON M20 20 MEQ tablet Take 20 mEq by mouth daily.      Marland Kitchen zolpidem (AMBIEN) 5 MG tablet Take 5 mg by mouth  at bedtime.       Assessment: 73 yo male s/p fall/rt hip fracture, possible wound infection, for empiric antibiotics  Goal of Therapy:  Vancomycin trough level 10-15 mcg/ml  Plan:  Vancomycin 1 g IV now, then 750 mg IV q24h  Caryl Pina 09/27/2013,1:09 AM

## 2013-09-27 NOTE — Progress Notes (Signed)
Patient ID: Chris [name]  male  VZD:[id number]    DOB: May 19, 1940    DOA: 09/26/2013  PCP: Vonna Drafts., FNP  Assessment/Plan: Principal Problem:   Closed right hip fracture with the wound status post fall - Orthopedics consulted, I notified Dr. [place] office, Dr Eulas Post will see patient today - Continue n.p.o. status, place on D5 IV fluids - pain control - Continue IV vancomycin due to history of infection in the right hip and wound, await further recommendations from orthopedics   Active Problems:   H/O CHF- normal LVEF per ECHO 2011 - Currently stable, euvolemic, place on gentle hydration, n.p.o.    COPD (chronic obstructive pulmonary disease) - Currently stable, no wheezing    Dementia - Somewhat confused, thinks he is at home - CT head negative for acute intracranial pathology - Will check UA and culture    Anemia, normocytic normochromic - Monitor closely, will likely need transfusion postop, hemoglobin 8.1  Coagulopathy -INR 1.9, patient is not on any anticoagulation  - Will give subcutaneous vitamin K 5mg   DVT Prophylaxis:SCD   Code Status: full code  Family Communication:  Disposition:  Consultants:  Orthopedics, Dr. Marily Memos  Procedures:  None  Antibiotics:  IV vancomycin    Subjective: Patient seen and examined, confused, states pain controlled  Objective: Weight change:  No intake or output data in the 24 hours ending 09/27/13 1109 Blood pressure 126/54, pulse 112, temperature 98 F (36.7 C), temperature source Oral, resp. rate 16, height 5' 10.87" (1.8 m), weight 55 kg (121 lb 4.1 oz), SpO2 98.00%.  Physical Exam: General: Alert and awake, oriented x to self, not in any acute distress. CVS: S1-S2 clear, no murmur rubs or gallops Chest: clear to auscultation bilaterally, no wheezing, rales or rhonchi Abdomen: soft nontender, nondistended, normal bowel sounds  Extremities: no cyanosis, clubbing or edema noted  bilaterally   Lab Results: Basic Metabolic Panel:  Recent Labs Lab 09/26/13 1921 09/27/13 0620  NA 138 137  K 3.8 3.7  CL 102 102  CO2 21 21  GLUCOSE 93 88  BUN 13 11  CREATININE 0.77 0.73  CALCIUM 8.9 8.7   Liver Function Tests:  Recent Labs Lab 09/27/13 0620  AST 34  ALT 13  ALKPHOS 89  BILITOT 0.8  PROT 6.5  ALBUMIN 2.8*   No results found for this basename: LIPASE, AMYLASE,  in the last 168 hours No results found for this basename: AMMONIA,  in the last 168 hours CBC:  Recent Labs Lab 09/26/13 1921 09/27/13 0620  WBC 7.2 5.6  NEUTROABS 6.1 4.1  HGB 8.4* 8.1*  HCT 24.6* 24.0*  MCV 86.0 86.3  PLT 356 341   Cardiac Enzymes: No results found for this basename: CKTOTAL, CKMB, CKMBINDEX, TROPONINI,  in the last 168 hours BNP: No components found with this basename: POCBNP,  CBG: No results found for this basename: GLUCAP,  in the last 168 hours   Micro Results: Recent Results (from the past 240 hour(s))  SURGICAL PCR SCREEN     Status: None   Collection Time    09/27/13  5:34 AM      Result Value Ref Range Status   MRSA, PCR NEGATIVE  NEGATIVE Final   Staphylococcus aureus NEGATIVE  NEGATIVE Final   Comment:            The Xpert SA Assay (FDA     approved for NASAL specimens     in patients over 4 years of age),  is one component of     a comprehensive surveillance     program.  Test performance has     been validated by [facility] for patients greater     than or equal to 63 year old.     It is not intended     to diagnose infection nor to     guide or monitor treatment.    Studies/Results: Dg Shoulder Right  09/26/2013   CLINICAL DATA:  Fall, right shoulder pain with in mobility  EXAM: RIGHT SHOULDER - 2+ VIEW  COMPARISON:  None.  FINDINGS: Mild acromioclavicular joint hypertrophy. Sub acromial spurring noted. Degenerative change at the cuff tendon insertion site on the greater tuberosity. No evidence of fracture or dislocation.   IMPRESSION: No acute findings   Electronically Signed   By: Skipper Cliche M.D.   On: 09/26/2013 21:23   Dg Hip Complete Right  09/26/2013   CLINICAL DATA:  Recent fall with right hip pain  EXAM: RIGHT HIP - COMPLETE 2+ VIEW  COMPARISON:  02/08/2013  FINDINGS: There is a new fracture in the proximal right femur surrounding the previously placed hip prosthesis. It involves predominantly the greater trochanter. No dislocation of the femoral prosthesis is noted. Chronic changes about the superior pubic rami on the left are noted. A left hip prosthesis is noted as well.  IMPRESSION: Fracture of the proximal right femur in the greater trochanter region surrounding the known prosthesis.   Electronically Signed   By: Inez Catalina M.D.   On: 09/26/2013 18:57   Ct Head Wo Contrast  09/27/2013   CLINICAL DATA:  [place] this morning and hit head with small laceration on the right  EXAM: CT HEAD WITHOUT CONTRAST  TECHNIQUE: Contiguous axial images were obtained from the base of the skull through the vertex without intravenous contrast.  COMPARISON:  MRI 03/02/2012  FINDINGS: Severe atrophy. Focal encephalomalacia left occipital lobe similar to prior study. Significant diffuse low attenuation in the deep white matter. No hemorrhage or extra-axial fluid. No infarct or mass. No skull fracture. Scattered paranasal sinusitis. Complete opacification left maxillary sinus.  IMPRESSION: No acute abnormalities.  Severe chronic change   Electronically Signed   By: Skipper Cliche M.D.   On: 09/27/2013 00:57   Dg Chest Port 1 View  09/26/2013   CLINICAL DATA:  Chest pain  EXAM: PORTABLE CHEST - 1 VIEW  COMPARISON:  07/26/2013  FINDINGS: The heart size and mediastinal contours are within normal limits. Both lungs are clear. The visualized skeletal structures are unremarkable.  IMPRESSION: No active disease.   Electronically Signed   By: Skipper Cliche M.D.   On: 09/26/2013 23:26    Medications: Scheduled Meds: . phytonadione   2.5 mg Oral Once  . thiamine IV  100 mg Intravenous Daily  . [START ON 09/28/2013] vancomycin  750 mg Intravenous Q24H  . zolpidem  5 mg Oral QHS      LOS: 1 day   RAI,RIPUDEEP M.D. [facility] 09/27/2013, 11:09 AM Pager: [phone]  If 7PM-7AM, please contact night-coverage [email or web address] Password [email or web address]

## 2013-09-28 LAB — BASIC METABOLIC PANEL
Anion gap: 10 (ref 5–15)
BUN: 9 mg/dL (ref 6–23)
CO2: 24 mEq/L (ref 19–32)
Calcium: 8.7 mg/dL (ref 8.4–10.5)
Chloride: 102 mEq/L (ref 96–112)
Creatinine, Ser: 0.73 mg/dL (ref 0.50–1.35)
GFR calc Af Amer: >90 mL/min (ref >90)
GFR calc non Af Amer: >90 mL/min (ref >90)
GLUCOSE: 117 mg/dL — AB (ref 70–99)
POTASSIUM: 3.7 meq/L (ref 3.7–5.3)
Sodium: 136 mEq/L — ABNORMAL LOW (ref 137–147)

## 2013-09-28 LAB — CBC
HEMATOCRIT: 26.1 % — AB (ref 39.0–52.0)
HEMOGLOBIN: 8.6 g/dL — AB (ref 13.0–17.0)
MCH: 27.7 pg (ref 26.0–34.0)
MCHC: 33 g/dL (ref 30.0–36.0)
MCV: 84.2 fL (ref 78.0–100.0)
Platelets: 337 10*3/uL (ref 150–400)
RBC: 3.1 MIL/uL — ABNORMAL LOW (ref 4.22–5.81)
RDW: 13.2 % (ref 11.5–15.5)
WBC: 4.2 10*3/uL (ref 4.0–10.5)

## 2013-09-28 LAB — VITAMIN D 25 HYDROXY (VIT D DEFICIENCY, FRACTURES): Vit D, 25-Hydroxy: 31 ng/mL (ref 30–89)

## 2013-09-28 LAB — PROTIME-INR
INR: 1.57 — ABNORMAL HIGH (ref 0.00–1.49)
PROTHROMBIN TIME: 18.8 s — AB (ref 11.6–15.2)

## 2013-09-28 MED ORDER — DEXTROSE 5 % IV SOLN
1.0000 g | INTRAVENOUS | Status: DC
  Administered 2013-09-28: 1 g via INTRAVENOUS
  Filled 2013-09-28 (×2): qty 10

## 2013-09-28 NOTE — Care Management Note (Signed)
CARE MANAGEMENT NOTE 09/28/2013  Patient:  Chris [name], Chris [name]   Account Number:  [id number]  Date Initiated:  09/27/2013  Documentation initiated by:  Ricki Miller  Subjective/Objective Assessment:   73 yr old male admitted with closed Right hip fracture.     Action/Plan:   Patient waiting for Ortho consult.  09/28/13- Per Dr. Alain Marion patient is not a surgical candidate, will require SNF. Social Worker is aware.   Anticipated DC Date:  09/29/2013   Anticipated DC Plan:  SKILLED NURSING FACILITY  In-house referral  Clinical Social Worker      DC Planning Services  CM consult      Choice offered to / List presented to:     DME arranged  NA           Status of service:  In process, will continue to follow Medicare Important Message given?   (If response is "NO", the following Medicare IM given date fields will be blank) Date Medicare IM given:   Medicare IM given by:   Date Additional Medicare IM given:   Additional Medicare IM given by:    Discharge Disposition:    Per UR Regulation:  Reviewed for med. necessity/level of care/duration of stay

## 2013-09-28 NOTE — Telephone Encounter (Signed)
Left message for Chris [name] at [facility] and relayed I don't have the FL2 form, but to return call and let us know what we need to do on our end.

## 2013-09-28 NOTE — Consult Note (Signed)
ORTHOPAEDIC CONSULTATION  REQUESTING PHYSICIAN: Ripudeep Krystal Eaton, MD  Chief Complaint: R periprosthetic fracture  HPI: Chris [name] is a 73 y.o. male who complains of a mechanical fall today, he has baseline dementia per his wife. No other pain He saw Dr. Eulas Post a few weeks ago and is still under his care.   Past Medical History  Diagnosis Date  . Poor dentition   . COPD (chronic obstructive pulmonary disease)   . CHF (congestive heart failure)   . History of blood transfusion 08/19/2011    "first time"  . Prostate cancer 2011    seed implant  . Atrial flutter     h/o; successful cardioversion,TEE guided  . Iron deficiency anemia     HX BLOOD TX  . Aortic valve regurgitation     moderate AR by echo 2010  . Mitral valve regurgitation     moderate to severe by 2D, mod by TEE '10  . Dementia    Past Surgical History  Procedure Laterality Date  . 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 Esophagogastroduodenoscopy  08/21/2011    Procedure: ESOPHAGOGASTRODUODENOSCOPY (EGD);  Surgeon: Lear Ng, MD;  Location: [facility] ENDOSCOPY;  Service: Endoscopy;  Laterality: N/A;  . Colonoscopy  08/21/2011    Procedure: COLONOSCOPY;  Surgeon: Lear Ng, MD;  Location: [facility] ENDOSCOPY;  Service: Endoscopy;  Laterality: N/A;  . Joint replacement    . Irrigation and debridement knee  12/23/2011  . I&d extremity  12/24/2011    Procedure: IRRIGATION AND DEBRIDEMENT EXTREMITY;  Surgeon: Sharmon Revere, MD;  Location: [place];  Service: Orthopedics;  Laterality: Right;  POSSIBLE WOUND CLOSURE  . Incision and drainage of wound  12/26/2011    Procedure: IRRIGATION AND DEBRIDEMENT WOUND;  Surgeon: Sharmon Revere, MD;  Location: [place];  Service: Orthopedics;  Laterality: Right;  I&D of Wound Right Hip and Wound Closure  . Incision and drainage of wound Right 04/28/2012    Procedure: IRRIGATION AND DEBRIDEMENT WOUND RIGHT HIP POSSIBLE CLOSURE;  Surgeon: Sharmon Revere, MD;  Location: [place];  Service: Orthopedics;  Laterality: Right;  . Incision and drainage hip Right 04/30/2012    Procedure: IRRIGATION AND DEBRIDEMENT HIP;  Surgeon: Sharmon Revere, MD;  Location: [place];  Service: Orthopedics;  Laterality: Right;  . Incision and drainage hip Right 05/26/2012    Procedure: IRRIGATION AND DEBRIDEMENT HIP and closure of wound;  Surgeon: Sharmon Revere, MD;  Location: [place];  Service: Orthopedics;  Laterality: Right;   History   Social History  . Marital Status: Divorced    Spouse Name: N/A    Number of Children: 2  . Years of Education: 12 th   Occupational History  .      Retired   Social History Main Topics  . Smoking status: Current Every Day Smoker -- 1.00 packs/day for 57 years    Types: Cigarettes  . Smokeless tobacco: Never Used  . Alcohol Use: 1.2 oz/week    2 Cans of beer per week     Comment: weekly  . Drug Use: No  . Sexual Activity: No   Other Topics Concern  . None   Social History Narrative   Lives with sister Chris [name] cell [phone] .    Patient is retired.   Education high school education.   Left handed.   Caffeine None.   Family History  Problem Relation Age of  Onset  . Dementia Father   . High blood pressure Mother    Allergies  Allergen Reactions  . Penicillins Nausea And Vomiting   Prior to Admission medications   Medication Sig Start Date End Date Taking? Authorizing Provider  cholecalciferol (VITAMIN D) 400 UNITS TABS tablet Take 400 Units by mouth daily.   Yes Historical Provider, MD  etodolac (LODINE) 400 MG tablet Take 400 mg by mouth daily.  07/21/13  Yes Historical Provider, MD  furosemide (LASIX) 40 MG tablet Take 40 mg by mouth daily. 09/05/13  Yes Historical Provider, MD  KLOR-CON M20 20 MEQ tablet Take 20 mEq by mouth daily. 09/05/13  Yes Historical Provider, MD  zolpidem (AMBIEN) 5 MG tablet Take 5 mg by mouth at bedtime. 09/25/13  Yes Historical Provider, MD   Dg Shoulder Right  09/26/2013    CLINICAL DATA:  Fall, right shoulder pain with in mobility  EXAM: RIGHT SHOULDER - 2+ VIEW  COMPARISON:  None.  FINDINGS: Mild acromioclavicular joint hypertrophy. Sub acromial spurring noted. Degenerative change at the cuff tendon insertion site on the greater tuberosity. No evidence of fracture or dislocation.  IMPRESSION: No acute findings   Electronically Signed   By: Skipper Cliche M.D.   On: 09/26/2013 21:23   Dg Hip Complete Right  09/26/2013   CLINICAL DATA:  Recent fall with right hip pain  EXAM: RIGHT HIP - COMPLETE 2+ VIEW  COMPARISON:  02/08/2013  FINDINGS: There is a new fracture in the proximal right femur surrounding the previously placed hip prosthesis. It involves predominantly the greater trochanter. No dislocation of the femoral prosthesis is noted. Chronic changes about the superior pubic rami on the left are noted. A left hip prosthesis is noted as well.  IMPRESSION: Fracture of the proximal right femur in the greater trochanter region surrounding the known prosthesis.   Electronically Signed   By: Inez Catalina M.D.   On: 09/26/2013 18:57   Ct Head Wo Contrast  09/27/2013   CLINICAL DATA:  [place] this morning and hit head with small laceration on the right  EXAM: CT HEAD WITHOUT CONTRAST  TECHNIQUE: Contiguous axial images were obtained from the base of the skull through the vertex without intravenous contrast.  COMPARISON:  MRI 03/02/2012  FINDINGS: Severe atrophy. Focal encephalomalacia left occipital lobe similar to prior study. Significant diffuse low attenuation in the deep white matter. No hemorrhage or extra-axial fluid. No infarct or mass. No skull fracture. Scattered paranasal sinusitis. Complete opacification left maxillary sinus.  IMPRESSION: No acute abnormalities.  Severe chronic change   Electronically Signed   By: Skipper Cliche M.D.   On: 09/27/2013 00:57   Dg Chest Port 1 View  09/26/2013   CLINICAL DATA:  Chest pain  EXAM: PORTABLE CHEST - 1 VIEW  COMPARISON:   07/26/2013  FINDINGS: The heart size and mediastinal contours are within normal limits. Both lungs are clear. The visualized skeletal structures are unremarkable.  IMPRESSION: No active disease.   Electronically Signed   By: Skipper Cliche M.D.   On: 09/26/2013 23:26    Positive ROS: All other systems have been reviewed and were otherwise negative with the exception of those mentioned in the HPI and as above.  Labs cbc  Recent Labs  09/27/13 0620 09/28/13 0608  WBC 5.6 4.2  HGB 8.1* 8.6*  HCT 24.0* 26.1*  PLT 341 337    Labs inflam No results found for this basename: ESR, CRP,  in the last 72 hours  Labs  coag  Recent Labs  09/26/13 1921 09/27/13 0620  INR 1.58* 1.99*     Recent Labs  09/27/13 0620 09/28/13 0608  NA 137 136*  K 3.7 3.7  CL 102 102  CO2 21 24  GLUCOSE 88 117*  BUN 11 9  CREATININE 0.73 0.73  CALCIUM 8.7 8.7    Physical Exam: Filed Vitals:   09/28/13 0559  BP: 107/53  Pulse: 111  Temp: 97.6 F (36.4 C)  Resp: 16   General: Alert, no acute distress Cardiovascular: No pedal edema Respiratory: No cyanosis, no use of accessory musculature GI: No organomegaly, abdomen is soft and non-tender Skin: No lesions in the area of chief complaint other than those listed below in MSK exam.  Neurologic: Sensation intact distally Psychiatric: baseline dementia Lymphatic: No axillary or cervical lymphadenopathy  MUSCULOSKELETAL:  RLE: pain with ROM, skin benign, distally wiggles toes, 2+ DP, sensation grossly intact Other extremities are atraumatic with painless ROM and NVI.  Assessment: R [place] A periprosthetic fracture  Plan: Non-operative treatment Weight Bearing Status: TDWB PT VTE px: SCD's and chemical per primary team  He will likely need SNF placement. He still has f/u with Dr. Eulas Post so his wife will make an appointment to see him in 2wks.   I will sign off at this point, pls call with any additional questions   Edmonia Lynch, D, MD Cell [phone]   09/28/2013 10:21 AM

## 2013-09-28 NOTE — Clinical Social Work Note (Signed)
CSW met with pt and POA (Joyce) at bedside. PT is recommending SNF. CSW completed assessment. POA is agreeable to SNF.  SNF search to be completed for [place]. Full assessment to follow.   Gina , LCSWA [phone]  Psychiatric & Orthopedics (5N 1-16) Clinical Social Worker   

## 2013-09-28 NOTE — Telephone Encounter (Signed)
Spoke to [place] at [facility] and she doesn't need anything from [place], but wanted to give [place] an update on patient and stated he is very unstable and may be referred to hospice.

## 2013-09-28 NOTE — Progress Notes (Signed)
Patient ID: Chris [name]  male  HCW:[id number]    DOB: 07-29-1940    DOA: 09/26/2013  PCP: Vonna Drafts., FNP  Brief history of present illness Chris [name] is a 73 y.o. male with history of dementia, atrial fibrillation, COPD was brought to the ER after patient had a fall at his house. Patient was found to have right proximal femur fracture in the greater trochanter region surrounding the known prosthesis   Assessment/Plan: Principal Problem:   Closed right hip fracture with the wound status post fall -  Dr. Eulas Post who has been patient's orthopedics MD in the past was consulted in ED, apparently does not see hospitalized patients anymore, I have requested Dr. Percell Miller, on call to see patient today.   - placed on n.p.o. Status and D5 IV fluids - pain control - Continue IV vancomycin due to history of infection in the right hip and wound, await further recommendations from orthopedics   Active Problems:   H/O CHF- normal LVEF per ECHO 2011 - Currently stable, euvolemic, place on gentle hydration, n.p.o.    COPD (chronic obstructive pulmonary disease) - Currently stable, no wheezing    Dementia - CT head negative for acute intracranial pathology  UTI - Placed on the IV Rocephin, await urine culture and sensitivities    Anemia, normocytic normochromic - Monitor closely, will likely need transfusion postop, hemoglobin 8.1  Coagulopathy -INR 1.9, patient is not on any anticoagulation, patient received vitamin K yesterday, will recheck INR today   DVT Prophylaxis:SCD   Code Status: full code  Family Communication:  Disposition:  Consultants:  Orthopedics, Dr. Percell Miller  Procedures:  None  Antibiotics:  IV vancomycin  IV Rocephin 9/24  Subjective: Patient seen and examined, confused, stable, no events overnight  Objective: Weight change:   Intake/Output Summary (Last 24 hours) at 09/28/13 1144 Last data filed at 09/28/13 0900  Gross per 24 hour    Intake    340 ml  Output      0 ml  Net    340 ml   Blood pressure 107/53, pulse 111, temperature 97.6 F (36.4 C), temperature source Oral, resp. rate 16, height 5' 10.87" (1.8 m), weight 55 kg (121 lb 4.1 oz), SpO2 97.00%.  Physical Exam: General: Alert and awake, oriented x to self, NAD CVS: S1-S2 clear, no murmur rubs or gallops Chest: clear to auscultation bilaterally, no wheezing, rales or rhonchi Abdomen: soft nontender, nondistended, normal bowel sounds  Extremities: no cyanosis, clubbing, 1+ edema noted bilaterally   Lab Results: Basic Metabolic Panel:  Recent Labs Lab 09/27/13 0620 09/28/13 0608  NA 137 136*  K 3.7 3.7  CL 102 102  CO2 21 24  GLUCOSE 88 117*  BUN 11 9  CREATININE 0.73 0.73  CALCIUM 8.7 8.7   Liver Function Tests:  Recent Labs Lab 09/27/13 0620  AST 34  ALT 13  ALKPHOS 89  BILITOT 0.8  PROT 6.5  ALBUMIN 2.8*   No results found for this basename: LIPASE, AMYLASE,  in the last 168 hours No results found for this basename: AMMONIA,  in the last 168 hours CBC:  Recent Labs Lab 09/27/13 0620 09/28/13 0608  WBC 5.6 4.2  NEUTROABS 4.1  --   HGB 8.1* 8.6*  HCT 24.0* 26.1*  MCV 86.3 84.2  PLT 341 337   Cardiac Enzymes: No results found for this basename: CKTOTAL, CKMB, CKMBINDEX, TROPONINI,  in the last 168 hours BNP: No components found with this basename: POCBNP,  CBG: No results found for this basename: GLUCAP,  in the last 168 hours   Micro Results: Recent Results (from the past 240 hour(s))  SURGICAL PCR SCREEN     Status: None   Collection Time    09/27/13  5:34 AM      Result Value Ref Range Status   MRSA, PCR NEGATIVE  NEGATIVE Final   Staphylococcus aureus NEGATIVE  NEGATIVE Final   Comment:            The Xpert SA Assay (FDA     approved for NASAL specimens     in patients over 27 years of age),     is one component of     a comprehensive surveillance     program.  Test performance has     been validated by  [employer] for patients greater     than or equal to 85 year old.     It is not intended     to diagnose infection nor to     guide or monitor treatment.    Studies/Results: Dg Shoulder Right  09/26/2013   CLINICAL DATA:  Fall, right shoulder pain with in mobility  EXAM: RIGHT SHOULDER - 2+ VIEW  COMPARISON:  None.  FINDINGS: Mild acromioclavicular joint hypertrophy. Sub acromial spurring noted. Degenerative change at the cuff tendon insertion site on the greater tuberosity. No evidence of fracture or dislocation.  IMPRESSION: No acute findings   Electronically Signed   By: Skipper Cliche M.D.   On: 09/26/2013 21:23   Dg Hip Complete Right  09/26/2013   CLINICAL DATA:  Recent fall with right hip pain  EXAM: RIGHT HIP - COMPLETE 2+ VIEW  COMPARISON:  02/08/2013  FINDINGS: There is a new fracture in the proximal right femur surrounding the previously placed hip prosthesis. It involves predominantly the greater trochanter. No dislocation of the femoral prosthesis is noted. Chronic changes about the superior pubic rami on the left are noted. A left hip prosthesis is noted as well.  IMPRESSION: Fracture of the proximal right femur in the greater trochanter region surrounding the known prosthesis.   Electronically Signed   By: Inez Catalina M.D.   On: 09/26/2013 18:57   Ct Head Wo Contrast  09/27/2013   CLINICAL DATA:  [place] this morning and hit head with small laceration on the right  EXAM: CT HEAD WITHOUT CONTRAST  TECHNIQUE: Contiguous axial images were obtained from the base of the skull through the vertex without intravenous contrast.  COMPARISON:  MRI 03/02/2012  FINDINGS: Severe atrophy. Focal encephalomalacia left occipital lobe similar to prior study. Significant diffuse low attenuation in the deep white matter. No hemorrhage or extra-axial fluid. No infarct or mass. No skull fracture. Scattered paranasal sinusitis. Complete opacification left maxillary sinus.  IMPRESSION: No acute  abnormalities.  Severe chronic change   Electronically Signed   By: Skipper Cliche M.D.   On: 09/27/2013 00:57   Dg Chest Port 1 View  09/26/2013   CLINICAL DATA:  Chest pain  EXAM: PORTABLE CHEST - 1 VIEW  COMPARISON:  07/26/2013  FINDINGS: The heart size and mediastinal contours are within normal limits. Both lungs are clear. The visualized skeletal structures are unremarkable.  IMPRESSION: No active disease.   Electronically Signed   By: Skipper Cliche M.D.   On: 09/26/2013 23:26    Medications: Scheduled Meds: . Influenza vac split quadrivalent PF  0.5 mL Intramuscular Tomorrow-1000  . lactose free nutrition  237 mL Oral TID WC  . phytonadione  2.5 mg Oral Once  . thiamine IV  100 mg Intravenous Daily  . vancomycin  750 mg Intravenous Q24H  . zolpidem  5 mg Oral QHS      LOS: 2 days   RAI,RIPUDEEP M.D. [facility] 09/28/2013, 11:44 AM Pager: [phone]  If 7PM-7AM, please contact night-coverage [email or web address] Password [email or web address]

## 2013-09-28 NOTE — Telephone Encounter (Signed)
Reviewed note

## 2013-09-29 LAB — BASIC METABOLIC PANEL
Anion gap: 11 (ref 5–15)
BUN: 6 mg/dL (ref 6–23)
CALCIUM: 8.1 mg/dL — AB (ref 8.4–10.5)
CO2: 23 mEq/L (ref 19–32)
CREATININE: 0.61 mg/dL (ref 0.50–1.35)
Chloride: 101 mEq/L (ref 96–112)
GFR calc Af Amer: >90 mL/min (ref >90)
GLUCOSE: 82 mg/dL (ref 70–99)
Potassium: 3.7 mEq/L (ref 3.7–5.3)
SODIUM: 135 meq/L — AB (ref 137–147)

## 2013-09-29 LAB — CBC
HCT: 22.5 % — ABNORMAL LOW (ref 39.0–52.0)
Hemoglobin: 7.5 g/dL — ABNORMAL LOW (ref 13.0–17.0)
MCH: 28.2 pg (ref 26.0–34.0)
MCHC: 33.3 g/dL (ref 30.0–36.0)
MCV: 84.6 fL (ref 78.0–100.0)
Platelets: 312 10*3/uL (ref 150–400)
RBC: 2.66 MIL/uL — ABNORMAL LOW (ref 4.22–5.81)
RDW: 13.3 % (ref 11.5–15.5)
WBC: 3.4 10*3/uL — ABNORMAL LOW (ref 4.0–10.5)

## 2013-09-29 LAB — URINE CULTURE
Colony Count: NO GROWTH
Culture: NO GROWTH

## 2013-09-29 LAB — PREPARE RBC (CROSSMATCH)

## 2013-09-29 MED ORDER — SODIUM CHLORIDE 0.9 % IV SOLN
Freq: Once | INTRAVENOUS | Status: AC
  Administered 2013-09-29: 10 mL/h via INTRAVENOUS

## 2013-09-29 MED ORDER — VITAMIN B-1 100 MG PO TABS
100.0000 mg | ORAL_TABLET | Freq: Every day | ORAL | Status: DC
  Administered 2013-09-30 – 2013-10-02 (×3): 100 mg via ORAL
  Filled 2013-09-29 (×4): qty 1

## 2013-09-29 NOTE — Progress Notes (Signed)
I participated in the care of this patient and agree with the above history, physical and evaluation. I performed a review of the history and a physical exam as detailed   Jamacia Jester Daniel Dealie Koelzer MD  

## 2013-09-29 NOTE — Progress Notes (Addendum)
Patient ID: Chris [name]  male  YQM:[id number]    DOB: 01-07-1940    DOA: 09/26/2013  PCP: Vonna Drafts., FNP  Brief history of present illness Chris [name] is a 73 y.o. male with history of dementia, atrial fibrillation, COPD was brought to the ER after patient had a fall at his house. Patient was found to have right proximal femur fracture in the greater trochanter region surrounding the known prosthesis   Assessment/Plan: Principal Problem:   Closed right hip fracture with the wound status post fall - Appreciate Dr. Percell Miller for seeing the patient, recommended nonoperative treatment, physical therapy, TTWB, will likely need skilled nursing facility - Tolerating diet, DC IV fluids - pain control - Continue IV vancomycin due to history of infection in the right hip and wound for now, we'll place on doxy and Cipro upon DC - Start physical therapy today  Active Problems:   H/O CHF- normal LVEF per ECHO 2011 - Currently stable, euvolemic    COPD (chronic obstructive pulmonary disease) - Currently stable, no wheezing    Dementia - CT head negative for acute intracranial pathology  UTI - Urine culture showed no growth, discontinued IV Rocephin     Anemia, normocytic normochromic; likely worsened due to hemodilution and bleeding from hip wound   -  hemoglobin down to 7.5, will transfuse 1 unit, baseline around 8-9  Coagulopathy -INR 1.57  DVT Prophylaxis:SCD   Code Status: full code  Family Communication:  Disposition: needs SNF  Consultants:  Orthopedics, Dr. Percell Miller  Procedures:  None  Antibiotics:  IV vancomycin  IV Rocephin 9/24  Subjective: Patient seen and examined,  eating breakfast with assistance, no complaints  Objective: Weight change:   Intake/Output Summary (Last 24 hours) at 09/29/13 1124 Last data filed at 09/28/13 1700  Gross per 24 hour  Intake    290 ml  Output      0 ml  Net    290 ml   Blood pressure 135/60, pulse 110,  temperature 97.4 F (36.3 C), temperature source Oral, resp. rate 16, height 5' 10.87" (1.8 m), weight 55 kg (121 lb 4.1 oz), SpO2 95.00%.  Physical Exam: General: Alert and awake, oriented x to self, NAD CVS: S1-S2 clear, no murmur rubs or gallops Chest: CTAB Abdomen: soft nontender, nondistended, normal bowel sounds  Extremities: no cyanosis, clubbing, 1+ edema noted bilaterally   Lab Results: Basic Metabolic Panel:  Recent Labs Lab 09/28/13 0608 09/29/13 0708  NA 136* 135*  K 3.7 3.7  CL 102 101  CO2 24 23  GLUCOSE 117* 82  BUN 9 6  CREATININE 0.73 0.61  CALCIUM 8.7 8.1*   Liver Function Tests:  Recent Labs Lab 09/27/13 0620  AST 34  ALT 13  ALKPHOS 89  BILITOT 0.8  PROT 6.5  ALBUMIN 2.8*   No results found for this basename: LIPASE, AMYLASE,  in the last 168 hours No results found for this basename: AMMONIA,  in the last 168 hours CBC:  Recent Labs Lab 09/27/13 0620 09/28/13 0608 09/29/13 0708  WBC 5.6 4.2 3.4*  NEUTROABS 4.1  --   --   HGB 8.1* 8.6* 7.5*  HCT 24.0* 26.1* 22.5*  MCV 86.3 84.2 84.6  PLT 341 337 312   Cardiac Enzymes: No results found for this basename: CKTOTAL, CKMB, CKMBINDEX, TROPONINI,  in the last 168 hours BNP: No components found with this basename: POCBNP,  CBG: No results found for this basename: GLUCAP,  in the last 168  hours   Micro Results: Recent Results (from the past 240 hour(s))  SURGICAL PCR SCREEN     Status: None   Collection Time    09/27/13  5:34 AM      Result Value Ref Range Status   MRSA, PCR NEGATIVE  NEGATIVE Final   Staphylococcus aureus NEGATIVE  NEGATIVE Final   Comment:            The Xpert SA Assay (FDA     approved for NASAL specimens     in patients over 33 years of age),     is one component of     a comprehensive surveillance     program.  Test performance has     been validated by [employer] for patients greater     than or equal to 19 year old.     It is not intended     to  diagnose infection nor to     guide or monitor treatment.  URINE CULTURE     Status: None   Collection Time    09/27/13  6:49 PM      Result Value Ref Range Status   Specimen Description URINE, RANDOM   Final   Special Requests NONE   Final   Culture  Setup Time     Final   Value: 09/27/2013 20:20     Performed at [facility]     Final   Value: NO GROWTH     Performed at [facility]   Culture     Final   Value: NO GROWTH     Performed at [facility]   Report Status 09/29/2013 FINAL   Final    Studies/Results: Dg Shoulder Right  09/26/2013   CLINICAL DATA:  Fall, right shoulder pain with in mobility  EXAM: RIGHT SHOULDER - 2+ VIEW  COMPARISON:  None.  FINDINGS: Mild acromioclavicular joint hypertrophy. Sub acromial spurring noted. Degenerative change at the cuff tendon insertion site on the greater tuberosity. No evidence of fracture or dislocation.  IMPRESSION: No acute findings   Electronically Signed   By: Skipper Cliche M.D.   On: 09/26/2013 21:23   Dg Hip Complete Right  09/26/2013   CLINICAL DATA:  Recent fall with right hip pain  EXAM: RIGHT HIP - COMPLETE 2+ VIEW  COMPARISON:  02/08/2013  FINDINGS: There is a new fracture in the proximal right femur surrounding the previously placed hip prosthesis. It involves predominantly the greater trochanter. No dislocation of the femoral prosthesis is noted. Chronic changes about the superior pubic rami on the left are noted. A left hip prosthesis is noted as well.  IMPRESSION: Fracture of the proximal right femur in the greater trochanter region surrounding the known prosthesis.   Electronically Signed   By: Inez Catalina M.D.   On: 09/26/2013 18:57   Ct Head Wo Contrast  09/27/2013   CLINICAL DATA:  [place] this morning and hit head with small laceration on the right  EXAM: CT HEAD WITHOUT CONTRAST  TECHNIQUE: Contiguous axial images were obtained from the base of the skull through the vertex without  intravenous contrast.  COMPARISON:  MRI 03/02/2012  FINDINGS: Severe atrophy. Focal encephalomalacia left occipital lobe similar to prior study. Significant diffuse low attenuation in the deep white matter. No hemorrhage or extra-axial fluid. No infarct or mass. No skull fracture. Scattered paranasal sinusitis. Complete opacification left maxillary sinus.  IMPRESSION: No acute abnormalities.  Severe chronic change   Electronically Signed   By: Skipper Cliche M.D.   On: 09/27/2013 00:57   Dg Chest Port 1 View  09/26/2013   CLINICAL DATA:  Chest pain  EXAM: PORTABLE CHEST - 1 VIEW  COMPARISON:  07/26/2013  FINDINGS: The heart size and mediastinal contours are within normal limits. Both lungs are clear. The visualized skeletal structures are unremarkable.  IMPRESSION: No active disease.   Electronically Signed   By: Skipper Cliche M.D.   On: 09/26/2013 23:26    Medications: Scheduled Meds: . Influenza vac split quadrivalent PF  0.5 mL Intramuscular Tomorrow-1000  . lactose free nutrition  237 mL Oral TID WC  . phytonadione  2.5 mg Oral Once  . thiamine IV  100 mg Intravenous Daily  . vancomycin  750 mg Intravenous Q24H  . zolpidem  5 mg Oral QHS      LOS: 3 days   Deyvi Bonanno M.D. [facility] 09/29/2013, 11:24 AM Pager: [phone]  If 7PM-7AM, please contact night-coverage [email or web address] Password [email or web address]

## 2013-09-29 NOTE — Clinical Social Work Note (Signed)
CSW spoke with POA, Blanch Media at bedside and provided update.  Pt is currently on bedrest.  RNCM to assist with having order lifted and PT/OT will re-evaluate.  CSW confirmed with PT that PT will work with pt today once the order has been lifted.  POA updated and aware.  CSW will complete SNF search once PT recommendations/evaluation is in.  Nonnie Done, [place] [phone]  Psychiatric & Orthopedics (5N 1-16) Clinical Social Worker

## 2013-09-29 NOTE — Progress Notes (Signed)
Patient ID: ARIZONA NORDQUIST, male   DOB: 1940/06/25, 73 y.o.   MRN: [id number]     Subjective:  Patient reports pain as mild.  Patient in bed in no acute distress and denies any CP or SOB  Objective:   VITALS:   Filed Vitals:   09/28/13 0559 09/28/13 1500 09/28/13 2101 09/29/13 0541  BP: 107/53 104/52 123/58 135/60  Pulse: 111 86 113 110  Temp: 97.6 F (36.4 C) 98.1 F (36.7 C) 97.8 F (36.6 C) 97.4 F (36.3 C)  TempSrc:      Resp: 16 18 16 16   Height:      Weight:      SpO2: 97% 98% 98% 95%    ABD soft Sensation intact distally Dorsiflexion/Plantar flexion intact Good ankle and foot function    Lab Results  Component Value Date   WBC 4.2 09/28/2013   HGB 8.6* 09/28/2013   HCT 26.1* 09/28/2013   MCV 84.2 09/28/2013   PLT 337 09/28/2013     Assessment/Plan:     Principal Problem:   Closed right hip fracture Active Problems:   H/O CHF- normal LVEF per ECHO 2011   COPD (chronic obstructive pulmonary disease)   Dementia   Anemia, normocytic normochromic   Advance diet Up with therapy TTWB Continue plan per medicine   Remonia Richter 09/29/2013, 7:16 AM   Edmonia Lynch MD [phone]

## 2013-09-29 NOTE — Progress Notes (Signed)
PT Cancellation Note  Patient Details Name: Chris [name] MRN: [id number] DOB: September 05, 1940   Cancelled Treatment:    Reason Eval/Treat Not Completed: Medical issues which prohibited therapy Patient is currently on bed rest. Will follow up for evaluation when patient is medically ready.  [place], [place]   Candie Mile S 09/29/2013, 8:16 AM

## 2013-09-29 NOTE — Evaluation (Signed)
Physical Therapy Evaluation Patient Details Name: Chris [name] MRN: [id number] DOB: [birth year] Today's Date: 09/29/2013   History of Present Illness  73 y.o. male admitted with close right hip fracture. Determined to be not appropriate for surgery. Hx of COPD and dementia.  Clinical Impression  Pt admitted with above. Pt currently with functional limitations due to the deficits listed below (see PT Problem List). Mod-max assist for mobility assessed today. Pt willing to transfer to chair with physical therapy, needs assist to maintain weight bearing status. Pt will benefit from skilled PT to increase their independence and safety with mobility to allow discharge to the venue listed below.     Follow Up Recommendations SNF;Supervision/Assistance - 24 hour    Equipment Recommendations  None recommended by PT    Recommendations for Other Services       Precautions / Restrictions Precautions Precautions: Fall Restrictions Weight Bearing Restrictions: Yes RLE Weight Bearing: Touchdown weight bearing      Mobility  Bed Mobility Overal bed mobility: Needs Assistance Bed Mobility: Supine to Sit     Supine to sit: Max assist;HOB elevated     General bed mobility comments: Max assist using bed pad to scoot to edge of bed. VC and tactile facilitation to assume seated position.  Transfers Overall transfer level: Needs assistance Equipment used: Rolling walker (2 wheeled) Transfers: Sit to/from [facility] Sit to Stand: Max assist;From elevated surface Stand pivot transfers: Mod assist       General transfer comment: Max assist for boost to seated position with assist to maintain TDWB on RLE. Pt very rigid, able to stand on LLE without buckling. Mod assist for pivot to control rolling walker with max VC for directions and technique. Intermittent assist to maintain TDWB on RLE. performed from bed to chair  Ambulation/Gait                Stairs             Wheelchair Mobility    Modified Rankin (Stroke Patients Only)       Balance Overall balance assessment: Needs assistance Sitting-balance support: Bilateral upper extremity supported;Feet supported Sitting balance-Leahy Scale: Poor     Standing balance support: Bilateral upper extremity supported Standing balance-Leahy Scale: Poor                               Pertinent Vitals/Pain Pain Assessment: No/denies pain    Home Living Family/patient expects to be discharged to:: Skilled nursing facility Living Arrangements: Other (Comment) (Sister) Available Help at Discharge: [place]: Gilford Rile - 2 wheels;Bedside commode;Cane - single point      Prior Function Level of Independence: Needs assistance   Gait / Transfers Assistance Needed: Ambulates with cane prior to fall  ADL's / Homemaking Assistance Needed: needed assist with all ADLs from sister.        Hand Dominance        Extremity/Trunk Assessment   Upper Extremity Assessment: Defer to OT evaluation           Lower Extremity Assessment: Difficult to assess due to impaired cognition         Communication   Communication: No difficulties  Cognition Arousal/Alertness: Awake/alert Behavior During Therapy: Restless;Impulsive Overall Cognitive Status: History of cognitive impairments - at baseline       Memory: Decreased recall of precautions;Decreased short-term memory  General Comments General comments (skin integrity, edema, etc.): Skin breakdown along border of spine of right scapula. Pillow placed behind pts back while sitting in chair for relief. Family present during evaluation and provided answers during evaluation due to pts dementia.    Exercises        Assessment/Plan    PT Assessment Patient needs continued PT services  PT Diagnosis Difficulty walking;Generalized weakness;Acute pain;Altered mental status    PT Problem List Decreased strength;Decreased range of motion;Decreased activity tolerance;Decreased balance;Decreased mobility;Decreased cognition;Decreased knowledge of use of DME;Decreased knowledge of precautions;Decreased safety awareness;Pain  PT Treatment Interventions DME instruction;Gait training;Functional mobility training;Therapeutic activities;Therapeutic exercise;Balance training;Neuromuscular re-education;Cognitive remediation;Patient/family education;Manual techniques;Modalities   PT Goals (Current goals can be found in the Care Plan section) Acute Rehab PT Goals Patient Stated Goal: None stated PT Goal Formulation: With family Time For Goal Achievement: 10/06/13 Potential to Achieve Goals: Fair    Frequency Min 3X/week   Barriers to discharge Decreased caregiver support Pt too low level of mobility for family to safely care for pt at home    Co-evaluation               End of Session Equipment Utilized During Treatment: Gait belt Activity Tolerance: Patient limited by pain;Other (comment) (Pt limited by poor cognition) Patient left: in chair;with call bell/phone within reach;with family/visitor present Nurse Communication: Mobility status         Time: 1130-1159 PT Time Calculation (min): 29 min   Charges:   PT Evaluation $Initial PT Evaluation Tier I: 1 Procedure PT Treatments $Therapeutic Activity: 8-22 mins   PT G Codes:         Elayne Snare, [place]  Ellouise Newer 09/29/2013, 1:03 PM

## 2013-09-30 DIAGNOSIS — T84049A Periprosthetic fracture around unspecified internal prosthetic joint, initial encounter: Secondary | ICD-10-CM | POA: Diagnosis not present

## 2013-09-30 DIAGNOSIS — Z9660 Presence of unspecified orthopedic joint implant: Secondary | ICD-10-CM | POA: Diagnosis not present

## 2013-09-30 LAB — CBC
HEMATOCRIT: 27.2 % — AB (ref 39.0–52.0)
HEMOGLOBIN: 9.4 g/dL — AB (ref 13.0–17.0)
MCH: 28.3 pg (ref 26.0–34.0)
MCHC: 34.6 g/dL (ref 30.0–36.0)
MCV: 81.9 fL (ref 78.0–100.0)
Platelets: 299 10*3/uL (ref 150–400)
RBC: 3.32 MIL/uL — AB (ref 4.22–5.81)
RDW: 13 % (ref 11.5–15.5)
WBC: 5.7 10*3/uL (ref 4.0–10.5)

## 2013-09-30 LAB — BASIC METABOLIC PANEL
Anion gap: 13 (ref 5–15)
BUN: 9 mg/dL (ref 6–23)
CHLORIDE: 100 meq/L (ref 96–112)
CO2: 22 meq/L (ref 19–32)
Calcium: 8.5 mg/dL (ref 8.4–10.5)
Creatinine, Ser: 0.65 mg/dL (ref 0.50–1.35)
GFR calc Af Amer: >90 mL/min (ref >90)
GFR calc non Af Amer: >90 mL/min (ref >90)
GLUCOSE: 101 mg/dL — AB (ref 70–99)
POTASSIUM: 4.5 meq/L (ref 3.7–5.3)
SODIUM: 135 meq/L — AB (ref 137–147)

## 2013-09-30 LAB — TYPE AND SCREEN
ABO/RH(D): B POS
Antibody Screen: NEGATIVE
UNIT DIVISION: 0

## 2013-09-30 LAB — HEMOGLOBIN AND HEMATOCRIT, BLOOD
HCT: 27.4 % — ABNORMAL LOW (ref 39.0–52.0)
HEMOGLOBIN: 9.6 g/dL — AB (ref 13.0–17.0)

## 2013-09-30 NOTE — Progress Notes (Signed)
Pt with history of dementia, pulled IV at 2055 while unit of pRBC's infusing. This RN and patient's assigned RN attempted IV restart with no success. IV team notified. IV team able to reestablish access. Blood immediately restarted. See Blood Admin flowsheet.

## 2013-09-30 NOTE — Progress Notes (Signed)
ANTIBIOTIC CONSULT NOTE - FOLLOW UP  Pharmacy Consult for Vancomycin Indication: possible wound infection  Allergies  Allergen Reactions  . Penicillins Nausea And Vomiting    Patient Measurements: Height: 5' 10.87" (180 cm) Weight: 121 lb 4.1 oz (55 kg) (07/2013) IBW/kg (Calculated) : 74.99  Vital Signs: Temp: 97.8 F (36.6 C) (09/26 1303) Temp src: Oral (09/26 1303) BP: 117/45 mmHg (09/26 1303) Pulse Rate: 113 (09/26 1303) Intake/Output from previous day: 09/25 0701 - 09/26 0700 In: 1324.5 [P.O.:720; I.V.:250; Blood:354.5] Out: -  Intake/Output from this shift: Total I/O In: 480 [P.O.:480] Out: -   Labs:  Recent Labs  09/28/13 0608 09/29/13 0708 09/30/13 0120  WBC 4.2 3.4* 5.7  HGB 8.6* 7.5* 9.4*  9.6*  PLT 337 312 299  CREATININE 0.73 0.61 0.65   Estimated Creatinine Clearance: 64.9 ml/min (by C-G formula based on Cr of 0.65). No results found for this basename: VANCOTROUGH, Corlis Leak, VANCORANDOM, GENTTROUGH, GENTPEAK, GENTRANDOM, TOBRATROUGH, TOBRAPEAK, TOBRARND, AMIKACINPEAK, AMIKACINTROU, AMIKACIN,  in the last 72 hours   Microbiology: Recent Results (from the past 720 hour(s))  SURGICAL PCR SCREEN     Status: None   Collection Time    09/27/13  5:34 AM      Result Value Ref Range Status   MRSA, PCR NEGATIVE  NEGATIVE Final   Staphylococcus aureus NEGATIVE  NEGATIVE Final   Comment:            The Xpert SA Assay (FDA     approved for NASAL specimens     in patients over 72 years of age),     is one component of     a comprehensive surveillance     program.  Test performance has     been validated by [employer] for patients greater     than or equal to 72 year old.     It is not intended     to diagnose infection nor to     guide or monitor treatment.  URINE CULTURE     Status: None   Collection Time    09/27/13  6:49 PM      Result Value Ref Range Status   Specimen Description URINE, RANDOM   Final   Special Requests NONE   Final   Culture  Setup Time     Final   Value: 09/27/2013 20:20     Performed at [place]     Final   Value: NO GROWTH     Performed at [facility]   Culture     Final   Value: NO GROWTH     Performed at [facility]   Report Status 09/29/2013 FINAL   Final    Anti-infectives   Start     Dose/Rate Route Frequency Ordered Stop   09/28/13 1300  cefTRIAXone (ROCEPHIN) 1 g in dextrose 5 % 50 mL IVPB  Status:  Discontinued     1 g 100 mL/hr over 30 Minutes Intravenous Every 24 hours 09/28/13 1151 09/29/13 1122   09/28/13 0600  vancomycin (VANCOCIN) IVPB 750 mg/150 ml premix     750 mg 150 mL/hr over 60 Minutes Intravenous Every 24 hours 09/27/13 0114     09/27/13 0200  vancomycin (VANCOCIN) IVPB 1000 mg/200 mL premix     1,000 mg 200 mL/hr over 60 Minutes Intravenous  Once 09/27/13 0114 09/27/13 0330      Assessment: 73 yoM s/p fall with right  hip fracture and possible wound infection.  He is afebrile with normal WBC and no cultures pending.   Patient is receiving Vancomycin with plans to transition to ciprofloxacin or doxycycline at discharge per MD.  Patient to be discharged to SNF and awaiting placement.  Goal of Therapy:  Vancomycin trough level 10-15 mcg/ml  Plan:  1) Cont vanc 750 mg q24h   Theron Arista, PharmD Clinical Pharmacist - Resident Pager: [phone] 9/26/20153:00 PM

## 2013-09-30 NOTE — Progress Notes (Signed)
Patient ID: ELSIE SAKUMA  male  [id number]:[id number]    DOB: 1940/09/17    DOA: 09/26/2013  PCP: Vonna Drafts., FNP  Brief history of present illness WILFERD RITSON is a 73 y.o. male with history of dementia, atrial fibrillation, COPD was brought to the ER after patient had a fall at his house. Patient was found to have right proximal femur fracture in the greater trochanter region surrounding the known prosthesis   Assessment/Plan: Principal Problem:   Closed right hip fracture with the wound status post fall - Appreciate Dr. Percell Miller for seeing the patient, recommended nonoperative treatment, physical therapy, TTWB - Tolerating diet, continue pain control, PT, needs SNF - Continue IV vancomycin due to history of infection in the right hip and wound for now, will place on doxy and Cipro upon DC  Active Problems:   H/O CHF- normal LVEF per ECHO 2011 - Currently stable, euvolemic    COPD (chronic obstructive pulmonary disease) - Currently stable, no wheezing    Dementia - CT head negative for acute intracranial pathology  UTI - Urine culture showed no growth, discontinued IV Rocephin     Anemia, normocytic normochromic; likely worsened due to hemodilution and bleeding from hip wound   -  hemoglobin down to 7.5, transfused 1 unit 9/26, hb 9.6  Coagulopathy -INR 1.57  DVT Prophylaxis:SCD   Code Status: full code  Family Communication:  Disposition: needs SNF  Consultants:  Orthopedics, Dr. Percell Miller  Procedures:  None  Antibiotics:  IV vancomycin  IV Rocephin 9/24  Subjective: Patient seen and examined,  No complaints  Objective: Weight change:   Intake/Output Summary (Last 24 hours) at 09/30/13 0957 Last data filed at 09/29/13 2232  Gross per 24 hour  Intake 1204.5 ml  Output      0 ml  Net 1204.5 ml   Blood pressure 119/57, pulse 116, temperature 97.9 F (36.6 C), temperature source Oral, resp. rate 16, height 5' 10.87" (1.8 m), weight 55 kg (121 lb  4.1 oz), SpO2 100.00%.  Physical Exam: General: A xO self, NAD, pleasant CVS: S1-S2 clear, no murmur rubs or gallops Chest: CTAB Abdomen: soft nontender, nondistended, normal bowel sounds  Extremities: no cyanosis, clubbing, 1+ edema noted bilaterally, right hip dressing intact   Lab Results: Basic Metabolic Panel:  Recent Labs Lab 09/29/13 0708 09/30/13 0120  NA 135* 135*  K 3.7 4.5  CL 101 100  CO2 23 22  GLUCOSE 82 101*  BUN 6 9  CREATININE 0.61 0.65  CALCIUM 8.1* 8.5   Liver Function Tests:  Recent Labs Lab 09/27/13 0620  AST 34  ALT 13  ALKPHOS 89  BILITOT 0.8  PROT 6.5  ALBUMIN 2.8*   No results found for this basename: LIPASE, AMYLASE,  in the last 168 hours No results found for this basename: AMMONIA,  in the last 168 hours CBC:  Recent Labs Lab 09/27/13 0620  09/29/13 0708 09/30/13 0120  WBC 5.6  < > 3.4* 5.7  NEUTROABS 4.1  --   --   --   HGB 8.1*  < > 7.5* 9.4*  9.6*  HCT 24.0*  < > 22.5* 27.2*  27.4*  MCV 86.3  < > 84.6 81.9  PLT 341  < > 312 299  < > = values in this interval not displayed. Cardiac Enzymes: No results found for this basename: CKTOTAL, CKMB, CKMBINDEX, TROPONINI,  in the last 168 hours BNP: No components found with this basename: POCBNP,  CBG: No results  found for this basename: GLUCAP,  in the last 168 hours   Micro Results: Recent Results (from the past 240 hour(s))  SURGICAL PCR SCREEN     Status: None   Collection Time    09/27/13  5:34 AM      Result Value Ref Range Status   MRSA, PCR NEGATIVE  NEGATIVE Final   Staphylococcus aureus NEGATIVE  NEGATIVE Final   Comment:            The Xpert SA Assay (FDA     approved for NASAL specimens     in patients over 48 years of age),     is one component of     a comprehensive surveillance     program.  Test performance has     been validated by [employer] for patients greater     than or equal to 92 year old.     It is not intended     to diagnose  infection nor to     guide or monitor treatment.  URINE CULTURE     Status: None   Collection Time    09/27/13  6:49 PM      Result Value Ref Range Status   Specimen Description URINE, RANDOM   Final   Special Requests NONE   Final   Culture  Setup Time     Final   Value: 09/27/2013 20:20     Performed at [facility]     Final   Value: NO GROWTH     Performed at [facility]   Culture     Final   Value: NO GROWTH     Performed at [facility]   Report Status 09/29/2013 FINAL   Final    Studies/Results: Dg Shoulder Right  09/26/2013   CLINICAL DATA:  Fall, right shoulder pain with in mobility  EXAM: RIGHT SHOULDER - 2+ VIEW  COMPARISON:  None.  FINDINGS: Mild acromioclavicular joint hypertrophy. Sub acromial spurring noted. Degenerative change at the cuff tendon insertion site on the greater tuberosity. No evidence of fracture or dislocation.  IMPRESSION: No acute findings   Electronically Signed   By: Skipper Cliche M.D.   On: 09/26/2013 21:23   Dg Hip Complete Right  09/26/2013   CLINICAL DATA:  Recent fall with right hip pain  EXAM: RIGHT HIP - COMPLETE 2+ VIEW  COMPARISON:  02/08/2013  FINDINGS: There is a new fracture in the proximal right femur surrounding the previously placed hip prosthesis. It involves predominantly the greater trochanter. No dislocation of the femoral prosthesis is noted. Chronic changes about the superior pubic rami on the left are noted. A left hip prosthesis is noted as well.  IMPRESSION: Fracture of the proximal right femur in the greater trochanter region surrounding the known prosthesis.   Electronically Signed   By: Inez Catalina M.D.   On: 09/26/2013 18:57   Ct Head Wo Contrast  09/27/2013   CLINICAL DATA:  [place] this morning and hit head with small laceration on the right  EXAM: CT HEAD WITHOUT CONTRAST  TECHNIQUE: Contiguous axial images were obtained from the base of the skull through the vertex without  intravenous contrast.  COMPARISON:  MRI 03/02/2012  FINDINGS: Severe atrophy. Focal encephalomalacia left occipital lobe similar to prior study. Significant diffuse low attenuation in the deep white matter. No hemorrhage or extra-axial fluid. No infarct or mass. No skull fracture. Scattered paranasal sinusitis.  Complete opacification left maxillary sinus.  IMPRESSION: No acute abnormalities.  Severe chronic change   Electronically Signed   By: Skipper Cliche M.D.   On: 09/27/2013 00:57   Dg Chest Port 1 View  09/26/2013   CLINICAL DATA:  Chest pain  EXAM: PORTABLE CHEST - 1 VIEW  COMPARISON:  07/26/2013  FINDINGS: The heart size and mediastinal contours are within normal limits. Both lungs are clear. The visualized skeletal structures are unremarkable.  IMPRESSION: No active disease.   Electronically Signed   By: Skipper Cliche M.D.   On: 09/26/2013 23:26    Medications: Scheduled Meds: . Influenza vac split quadrivalent PF  0.5 mL Intramuscular Tomorrow-1000  . lactose free nutrition  237 mL Oral TID WC  . phytonadione  2.5 mg Oral Once  . thiamine  100 mg Oral Daily  . vancomycin  750 mg Intravenous Q24H  . zolpidem  5 mg Oral QHS      LOS: 4 days   RAI,RIPUDEEP M.D. [facility] 09/30/2013, 9:57 AM Pager: [phone]  If 7PM-7AM, please contact night-coverage [email or web address] Password [email or web address]

## 2013-09-30 NOTE — ED Provider Notes (Signed)
Medical screening examination/treatment/procedure(s) were conducted as a shared visit with non-physician practitioner(s) and myself.  I personally evaluated the patient during the encounter.   EKG Interpretation   Date/Time:  Wednesday September 27 2013 00:32:32 EDT Ventricular Rate:  106 PR Interval:  63 QRS Duration: 72 QT Interval:  394 QTC Calculation: 523 R Axis:   88 Text Interpretation:  Sinus tachycardia Ventricular premature complex  Consider right atrial enlargement Borderline right axis deviation  Borderline repolarization abnormality Prolonged QT interval Artifact in  lead(s) I aVL aVF V1 V3 V6 ED PHYSICIAN INTERPRETATION AVAILABLE IN [facility] Confirmed by TEST, Record ([id number]) on 09/29/2013 7:17:59 AM      Pt s/p fall/ c/o right hip pain. Distal pulses palp. Spine nt. xr hip.   Mirna Mires, MD 09/30/13 2143

## 2013-09-30 NOTE — Progress Notes (Signed)
Pt.  with a history of Dementia pulled out Right arm IV while Blood is  Transfusing at 20:55pm. IV immediately clamped.  Charge nurse notified. This RN attempted to restart a new IV with no success. Charge nurse also attempted with no success. IV team notified.

## 2013-10-01 ENCOUNTER — Inpatient Hospital Stay (HOSPITAL_COMMUNITY): Payer: Medicare Other

## 2013-10-01 DIAGNOSIS — E43 Unspecified severe protein-calorie malnutrition: Secondary | ICD-10-CM

## 2013-10-01 MED ORDER — ETODOLAC 400 MG PO TABS
400.0000 mg | ORAL_TABLET | Freq: Every day | ORAL | Status: DC
  Administered 2013-10-01 – 2013-10-02 (×2): 400 mg via ORAL
  Filled 2013-10-01 (×2): qty 1

## 2013-10-01 MED ORDER — FUROSEMIDE 40 MG PO TABS
40.0000 mg | ORAL_TABLET | Freq: Every day | ORAL | Status: DC
  Administered 2013-10-01 – 2013-10-02 (×2): 40 mg via ORAL
  Filled 2013-10-01 (×2): qty 1

## 2013-10-01 MED ORDER — CHOLECALCIFEROL 10 MCG (400 UNIT) PO TABS
400.0000 [IU] | ORAL_TABLET | Freq: Every day | ORAL | Status: DC
  Administered 2013-10-02: 400 [IU] via ORAL
  Filled 2013-10-01 (×3): qty 1

## 2013-10-01 MED ORDER — POTASSIUM CHLORIDE CRYS ER 20 MEQ PO TBCR
20.0000 meq | EXTENDED_RELEASE_TABLET | Freq: Every day | ORAL | Status: DC
  Administered 2013-10-01 – 2013-10-02 (×2): 20 meq via ORAL
  Filled 2013-10-01 (×2): qty 1

## 2013-10-01 MED ORDER — FUROSEMIDE 10 MG/ML IJ SOLN
20.0000 mg | Freq: Once | INTRAMUSCULAR | Status: DC
  Filled 2013-10-01: qty 2

## 2013-10-01 NOTE — Progress Notes (Signed)
Pt. noted to have elevated pulse. Pulse continues in the 100's. Pt. had low O2 sat of 90%. 02 Nasal canula@2L  placed. 02 sat rechecked  94%.

## 2013-10-01 NOTE — Progress Notes (Signed)
Weekend CSW (Holiday representative) called and spoke with pt nephew and his wife. They informed CSW Blanch Media was not available but they could take a message. CSW provided with bed offers and notified that weekday CSW will follow up.  [place], [place]
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
## 2013-10-01 NOTE — Progress Notes (Signed)
Patient ID: Chris [name]  male  JJK:[id number]    DOB: Jan 12, 1940    DOA: 09/26/2013  PCP: Vonna Drafts., FNP  Brief history of present illness Chris [name] is a 73 y.o. male with history of dementia, atrial fibrillation, COPD was brought to the ER after patient had a fall at his house. Patient was found to have right proximal femur fracture in the greater trochanter region surrounding the known prosthesis   Assessment/Plan: Principal Problem:   Closed right hip fracture with the wound status post fall - Appreciate Dr. Percell Miller for seeing the patient, recommended nonoperative treatment, physical therapy, TTWB, SNF - Tolerating diet, DC IV fluids, Lasix - pain control - Continue IV vancomycin due to history of infection in the right hip and wound for now, we'll place on doxy and Cipro upon DC  Active Problems:   H/O CHF- normal LVEF per ECHO 2011 -  3.9 liters positive, Placed on IV Lasix 20 mg x1 and restart home dose of oral Lasix - Will check portable chest x-ray, and having tachycardia and hypoxia    COPD (chronic obstructive pulmonary disease) - Currently stable, no wheezing    Dementia - CT head negative for acute intracranial pathology  UTI - Urine culture showed no growth, discontinued IV Rocephin     Anemia, normocytic normochromic; likely worsened due to hemodilution and bleeding from hip wound   - Hemoglobin stable after one unit transfusion  Coagulopathy -INR 1.57  DVT Prophylaxis:SCD   Code Status: full code  Family Communication:  Disposition: needs SNF  Consultants:  Orthopedics, Dr. Percell Miller  Procedures:  None  Antibiotics:  IV vancomycin  IV Rocephin 9/24  Subjective: Patient seen and examined, dementia, no significant complaints however having persistent tachycardia with O2 sats of 90%, 2 L O2 placed  Objective: Weight change:   Intake/Output Summary (Last 24 hours) at 10/01/13 1003 Last data filed at 09/30/13 1751  Gross per  24 hour  Intake   1680 ml  Output      0 ml  Net   1680 ml   Blood pressure 125/52, pulse 123, temperature 98.2 F (36.8 C), temperature source Oral, resp. rate 16, height 5' 10.87" (1.8 m), weight 55 kg (121 lb 4.1 oz), SpO2 93.00%.  Physical Exam: General: Alert and awake, oriented x to self, NAD CVS: S1-S2 clear, no murmur rubs or gallops Chest: Decreased breath sound at the bases Abdomen: soft nontender, nondistended, normal bowel sounds  Extremities: no cyanosis, clubbing, 1+ edema noted bilaterally   Lab Results: Basic Metabolic Panel:  Recent Labs Lab 09/29/13 0708 09/30/13 0120  NA 135* 135*  K 3.7 4.5  CL 101 100  CO2 23 22  GLUCOSE 82 101*  BUN 6 9  CREATININE 0.61 0.65  CALCIUM 8.1* 8.5   Liver Function Tests:  Recent Labs Lab 09/27/13 0620  AST 34  ALT 13  ALKPHOS 89  BILITOT 0.8  PROT 6.5  ALBUMIN 2.8*   No results found for this basename: LIPASE, AMYLASE,  in the last 168 hours No results found for this basename: AMMONIA,  in the last 168 hours CBC:  Recent Labs Lab 09/27/13 0620  09/29/13 0708 09/30/13 0120  WBC 5.6  < > 3.4* 5.7  NEUTROABS 4.1  --   --   --   HGB 8.1*  < > 7.5* 9.4*  9.6*  HCT 24.0*  < > 22.5* 27.2*  27.4*  MCV 86.3  < > 84.6 81.9  PLT 341  < >  312 299  < > = values in this interval not displayed. Cardiac Enzymes: No results found for this basename: CKTOTAL, CKMB, CKMBINDEX, TROPONINI,  in the last 168 hours BNP: No components found with this basename: POCBNP,  CBG: No results found for this basename: GLUCAP,  in the last 168 hours   Micro Results: Recent Results (from the past 240 hour(s))  SURGICAL PCR SCREEN     Status: None   Collection Time    09/27/13  5:34 AM      Result Value Ref Range Status   MRSA, PCR NEGATIVE  NEGATIVE Final   Staphylococcus aureus NEGATIVE  NEGATIVE Final   Comment:            The Xpert SA Assay (FDA     approved for NASAL specimens     in patients over 32 years of age),      is one component of     a comprehensive surveillance     program.  Test performance has     been validated by [employer] for patients greater     than or equal to 37 year old.     It is not intended     to diagnose infection nor to     guide or monitor treatment.  URINE CULTURE     Status: None   Collection Time    09/27/13  6:49 PM      Result Value Ref Range Status   Specimen Description URINE, RANDOM   Final   Special Requests NONE   Final   Culture  Setup Time     Final   Value: 09/27/2013 20:20     Performed at [facility]     Final   Value: NO GROWTH     Performed at [facility]   Culture     Final   Value: NO GROWTH     Performed at [facility]   Report Status 09/29/2013 FINAL   Final    Studies/Results: Dg Shoulder Right  09/26/2013   CLINICAL DATA:  Fall, right shoulder pain with in mobility  EXAM: RIGHT SHOULDER - 2+ VIEW  COMPARISON:  None.  FINDINGS: Mild acromioclavicular joint hypertrophy. Sub acromial spurring noted. Degenerative change at the cuff tendon insertion site on the greater tuberosity. No evidence of fracture or dislocation.  IMPRESSION: No acute findings   Electronically Signed   By: Skipper Cliche M.D.   On: 09/26/2013 21:23   Dg Hip Complete Right  09/26/2013   CLINICAL DATA:  Recent fall with right hip pain  EXAM: RIGHT HIP - COMPLETE 2+ VIEW  COMPARISON:  02/08/2013  FINDINGS: There is a new fracture in the proximal right femur surrounding the previously placed hip prosthesis. It involves predominantly the greater trochanter. No dislocation of the femoral prosthesis is noted. Chronic changes about the superior pubic rami on the left are noted. A left hip prosthesis is noted as well.  IMPRESSION: Fracture of the proximal right femur in the greater trochanter region surrounding the known prosthesis.   Electronically Signed   By: Inez Catalina M.D.   On: 09/26/2013 18:57   Ct Head Wo Contrast  09/27/2013    CLINICAL DATA:  [place] this morning and hit head with small laceration on the right  EXAM: CT HEAD WITHOUT CONTRAST  TECHNIQUE: Contiguous axial images were obtained from the base of the skull through the vertex without intravenous contrast.  COMPARISON:  MRI 03/02/2012  FINDINGS: Severe atrophy. Focal encephalomalacia left occipital lobe similar to prior study. Significant diffuse low attenuation in the deep white matter. No hemorrhage or extra-axial fluid. No infarct or mass. No skull fracture. Scattered paranasal sinusitis. Complete opacification left maxillary sinus.  IMPRESSION: No acute abnormalities.  Severe chronic change   Electronically Signed   By: Skipper Cliche M.D.   On: 09/27/2013 00:57   Dg Chest Port 1 View  09/26/2013   CLINICAL DATA:  Chest pain  EXAM: PORTABLE CHEST - 1 VIEW  COMPARISON:  07/26/2013  FINDINGS: The heart size and mediastinal contours are within normal limits. Both lungs are clear. The visualized skeletal structures are unremarkable.  IMPRESSION: No active disease.   Electronically Signed   By: Skipper Cliche M.D.   On: 09/26/2013 23:26    Medications: Scheduled Meds: . cholecalciferol  400 Units Oral Daily  . etodolac  400 mg Oral Daily  . furosemide  20 mg Intravenous Once  . furosemide  40 mg Oral Daily  . lactose free nutrition  237 mL Oral TID WC  . phytonadione  2.5 mg Oral Once  . potassium chloride SA  20 mEq Oral Daily  . thiamine  100 mg Oral Daily  . vancomycin  750 mg Intravenous Q24H  . zolpidem  5 mg Oral QHS      LOS: 5 days   Prue Lingenfelter M.D. [facility] 10/01/2013, 10:03 AM Pager: [phone]  If 7PM-7AM, please contact night-coverage [email or web address] Password [email or web address]

## 2013-10-01 NOTE — Progress Notes (Signed)
Pt. removed his right upper arm IV access  at 06:30AM while IV is infusing. This RN attempted to restart  With no success. IV team notified.

## 2013-10-02 LAB — BASIC METABOLIC PANEL
Anion gap: 12 (ref 5–15)
BUN: 9 mg/dL (ref 6–23)
CHLORIDE: 98 meq/L (ref 96–112)
CO2: 25 meq/L (ref 19–32)
CREATININE: 0.63 mg/dL (ref 0.50–1.35)
Calcium: 8.6 mg/dL (ref 8.4–10.5)
GFR calc Af Amer: >90 mL/min (ref >90)
GFR calc non Af Amer: >90 mL/min (ref >90)
Glucose, Bld: 82 mg/dL (ref 70–99)
Potassium: 4.3 mEq/L (ref 3.7–5.3)
SODIUM: 135 meq/L — AB (ref 137–147)

## 2013-10-02 MED ORDER — LEVOFLOXACIN 750 MG PO TABS
750.0000 mg | ORAL_TABLET | Freq: Every day | ORAL | Status: DC
  Administered 2013-10-02: 750 mg via ORAL
  Filled 2013-10-02: qty 1

## 2013-10-02 MED ORDER — DOXYCYCLINE HYCLATE 100 MG PO TABS: 100.0000 mg | ORAL_TABLET | Freq: Two times a day (BID) | ORAL | Status: AC

## 2013-10-02 MED ORDER — DOXYCYCLINE HYCLATE 100 MG PO TABS
100.0000 mg | ORAL_TABLET | Freq: Two times a day (BID) | ORAL | Status: DC
  Administered 2013-10-02: 100 mg via ORAL
  Filled 2013-10-02 (×2): qty 1

## 2013-10-02 MED ORDER — THIAMINE HCL 100 MG PO TABS: 100.0000 mg | ORAL_TABLET | Freq: Every day | ORAL | Status: AC

## 2013-10-02 MED ORDER — HYDROCODONE-ACETAMINOPHEN 5-325 MG PO TABS: 1.0000 | ORAL_TABLET | Freq: Four times a day (QID) | ORAL | Status: AC | PRN

## 2013-10-02 MED ORDER — LEVOFLOXACIN 750 MG PO TABS: 750.0000 mg | ORAL_TABLET | Freq: Every day | ORAL | Status: AC

## 2013-10-02 MED ORDER — BOOST PLUS PO LIQD: 237.0000 mL | Freq: Three times a day (TID) | ORAL | Status: AC

## 2013-10-02 MED ORDER — ZOLPIDEM TARTRATE 5 MG PO TABS: 5.0000 mg | ORAL_TABLET | Freq: Every day | ORAL | Status: AC

## 2013-10-02 NOTE — Clinical Social Work Placement (Addendum)
Clinical Social Work Department CLINICAL SOCIAL WORK PLACEMENT NOTE 09/29/2013  Patient:  KIRK, BASQUEZ  Account Number:  [id number] Admit date:  09/26/2013  Clinical Social Worker:  Wylene Men  Date/time:  09/29/2013 09:44 AM  Clinical Social Work is seeking post-discharge placement for this patient at the following level of care:   SKILLED NURSING   (*CSW will update this form in Epic as items are completed)   09/29/2013  Patient/family provided with [place] Department of Clinical Social Work's list of facilities offering this level of care within the geographic area requested by the patient (or if unable, by the patient's family).  09/29/2013  Patient/family informed of their freedom to choose among providers that offer the needed level of care, that participate in Medicare, Medicaid or managed care program needed by the patient, have an available bed and are willing to accept the patient.  09/29/2013  Patient/family informed of [facility]' ownership interest in [facility], as well as of the fact that they are under no obligation to receive care at this facility.  PASARR submitted to EDS on 09/29/2013 PASARR number received on 09/29/2013  FL2 transmitted to all facilities in geographic area requested by pt/family on  09/29/2013 FL2 transmitted to all facilities within larger geographic area on   Patient informed that his/her managed care company has contracts with or will negotiate with  certain facilities, including the following:     Patient/family informed of bed offers received:  10/02/2013 Patient chooses bed at [facility] and [place] recommends and patient chooses bed at n/a    Patient to be transferred to  [facility] and [facility] on  10/02/2013 Patient to be transferred to facility by [facility] Patient and family notified of transfer on 10/02/2013 Name of family member notified:  Blanch Media, sister/POA/main caregiver  The following  physician request were entered in Epic:   Additional Comments:

## 2013-10-02 NOTE — Discharge Summary (Signed)
Physician Discharge Summary  Patient ID: Chris [name] MRN: [id number] DOB/AGE: 1940/05/30 73 y.o.  Admit date: 09/26/2013 Discharge date: 10/02/2013  Primary Care Physician:  Vonna Drafts., FNP  Discharge Diagnoses:    . Closed right hip fracture . Anemia, normocytic normochromic . COPD (chronic obstructive pulmonary disease) . Dementia   Fluid overload with likely aspiration pneumonia   Consults:  Orthopedics, Dr. Percell Miller   Recommendations for Outpatient Follow-up:  Patient needs to followup with Dr. Eulas Post in one week.  Up with therapy  TTWB  Please follow with a swallow evaluation of the facility  Discharge diet: mechanical soft   Allergies:   Allergies  Allergen Reactions  . Penicillins Nausea And Vomiting     Discharge Medications:   Medication List         cholecalciferol 400 UNITS Tabs tablet  Commonly known as:  VITAMIN D  Take 400 Units by mouth daily.     doxycycline 100 MG tablet  Commonly known as:  VIBRA-TABS  Take 1 tablet (100 mg total) by mouth 2 (two) times daily. X 10days     etodolac 400 MG tablet  Commonly known as:  LODINE  Take 400 mg by mouth daily.     furosemide 40 MG tablet  Commonly known as:  LASIX  Take 40 mg by mouth daily.     HYDROcodone-acetaminophen 5-325 MG per tablet  Commonly known as:  NORCO/VICODIN  Take 1-2 tablets by mouth every 6 (six) hours as needed for moderate pain.     KLOR-CON M20 20 MEQ tablet  Generic drug:  potassium chloride SA  Take 20 mEq by mouth daily.     lactose free nutrition Liqd  Take 237 mLs by mouth 3 (three) times daily with meals.     levofloxacin 750 MG tablet  Commonly known as:  LEVAQUIN  Take 1 tablet (750 mg total) by mouth daily. x6 more days  Start taking on:  10/03/2013     thiamine 100 MG tablet  Take 1 tablet (100 mg total) by mouth daily.     zolpidem 5 MG tablet  Commonly known as:  AMBIEN  Take 1 tablet (5 mg total) by mouth at bedtime.         Brief  H and P: For complete details please refer to admission H and P, but in brief Chris [name] is a 73 y.o. male with history of dementia, atrial fibrillation, COPD was brought to the ER after patient had a fall at his house. Patient was found to flow by patient's family member. As per the family member patient was not unconscious. Patient had a small bruise on his scalp and CT of the head is pending at this time. X-ray show right femur fracture the greater trochanteric area. Patient has had previous infection of the right hip last year with nonhealing wound. Patient was found to have mild bleeding from the wound site initially on arrival. Patient is demented and does not contribute to history. Patient's sister states that patient has been having frequent falls recently. Patient also was recently assessed by neurologist in July for dementia.   Hospital Course:  BOYCE KELTNER is a 73 y.o. male with history of dementia, atrial fibrillation, COPD was brought to the ER after patient had a fall at his house. Patient was found to have right proximal femur fracture in the greater trochanter region surrounding the known prosthesis.  Closed right hip fracture with the wound status post fall  Patient  was admitted, orthopedics was consulted and patient was seen by Dr. Percell Miller. Orthopedics recommended nonoperative treatment, physical therapy, TTWB. Patient has appointment with Dr. Eulas Post who is his regular orthopedic surgeon. Patient has been doing well with pain control. He was placed on IV vancomycin due to history of infection in the right hip and wound. Upon discharge, placed on oral doxycycline for 10 more days.   H/O CHF- normal LVEF per ECHO 2011 with fluid overload and possible aspiration Continue Lasix at home dose. Chest x-ray showed new left basilar airspace disease and left pleural effusion worrisome for pneumonia, possibly due to aspiration. Patient was started on home dose of Lasix and placed on oral  levofloxacin for 6 more days. Patient has no overt choking or coughing, will likely benefit from swallow evaluation at the facility. Patient has no fevers or leukocytosis.  COPD (chronic obstructive pulmonary disease) - Currently stable, no wheezing   Dementia  - CT head negative for acute intracranial pathology   UTI  - Urine culture showed no growth, discontinued IV Rocephin   Anemia, normocytic normochromic; likely worsened due to hemodilution and bleeding from hip wound  - Hemoglobin stable after one unit transfusion   Coagulopathy  -INR 1.57   Day of Discharge BP 101/52  Pulse 94  Temp(Src) 97.6 F (36.4 C) (Oral)  Resp 16  Ht 5' 10.87" (1.8 m)  Wt 55 kg (121 lb 4.1 oz)  BMI 16.98 kg/m2  SpO2 97%  Physical Exam: General: Alert and awake oriented x to self, NAD CVS: S1-S2 clear no murmur rubs or gallops Chest: No wheezing, decreased breath sound at the bases Abdomen: soft nontender, nondistended, normal bowel sounds Extremities: no cyanosis, clubbing or edema noted bilaterally    The results of significant diagnostics from this hospitalization (including imaging, microbiology, ancillary and laboratory) are listed below for reference.    LAB RESULTS: Basic Metabolic Panel:  Recent Labs Lab 09/30/13 0120 10/02/13 0532  NA 135* 135*  K 4.5 4.3  CL 100 98  CO2 22 25  GLUCOSE 101* 82  BUN 9 9  CREATININE 0.65 0.63  CALCIUM 8.5 8.6   Liver Function Tests:  Recent Labs Lab 09/27/13 0620  AST 34  ALT 13  ALKPHOS 89  BILITOT 0.8  PROT 6.5  ALBUMIN 2.8*   No results found for this basename: LIPASE, AMYLASE,  in the last 168 hours No results found for this basename: AMMONIA,  in the last 168 hours CBC:  Recent Labs Lab 09/27/13 0620  09/29/13 0708 09/30/13 0120  WBC 5.6  < > 3.4* 5.7  NEUTROABS 4.1  --   --   --   HGB 8.1*  < > 7.5* 9.4*  9.6*  HCT 24.0*  < > 22.5* 27.2*  27.4*  MCV 86.3  < > 84.6 81.9  PLT 341  < > 312 299  < > = values in  this interval not displayed. Cardiac Enzymes: No results found for this basename: CKTOTAL, CKMB, CKMBINDEX, TROPONINI,  in the last 168 hours BNP: No components found with this basename: POCBNP,  CBG: No results found for this basename: GLUCAP,  in the last 168 hours  Significant Diagnostic Studies:  Dg Shoulder Right  09/26/2013   CLINICAL DATA:  Fall, right shoulder pain with in mobility  EXAM: RIGHT SHOULDER - 2+ VIEW  COMPARISON:  None.  FINDINGS: Mild acromioclavicular joint hypertrophy. Sub acromial spurring noted. Degenerative change at the cuff tendon insertion site on the greater tuberosity. No evidence  of fracture or dislocation.  IMPRESSION: No acute findings   Electronically Signed   By: Skipper Cliche M.D.   On: 09/26/2013 21:23   Dg Hip Complete Right  09/26/2013   CLINICAL DATA:  Recent fall with right hip pain  EXAM: RIGHT HIP - COMPLETE 2+ VIEW  COMPARISON:  02/08/2013  FINDINGS: There is a new fracture in the proximal right femur surrounding the previously placed hip prosthesis. It involves predominantly the greater trochanter. No dislocation of the femoral prosthesis is noted. Chronic changes about the superior pubic rami on the left are noted. A left hip prosthesis is noted as well.  IMPRESSION: Fracture of the proximal right femur in the greater trochanter region surrounding the known prosthesis.   Electronically Signed   By: Inez Catalina M.D.   On: 09/26/2013 18:57   Ct Head Wo Contrast  09/27/2013   CLINICAL DATA:  [place] this morning and hit head with small laceration on the right  EXAM: CT HEAD WITHOUT CONTRAST  TECHNIQUE: Contiguous axial images were obtained from the base of the skull through the vertex without intravenous contrast.  COMPARISON:  MRI 03/02/2012  FINDINGS: Severe atrophy. Focal encephalomalacia left occipital lobe similar to prior study. Significant diffuse low attenuation in the deep white matter. No hemorrhage or extra-axial fluid. No infarct or mass. No  skull fracture. Scattered paranasal sinusitis. Complete opacification left maxillary sinus.  IMPRESSION: No acute abnormalities.  Severe chronic change   Electronically Signed   By: Skipper Cliche M.D.   On: 09/27/2013 00:57   Dg Chest Port 1 View  09/26/2013   CLINICAL DATA:  Chest pain  EXAM: PORTABLE CHEST - 1 VIEW  COMPARISON:  07/26/2013  FINDINGS: The heart size and mediastinal contours are within normal limits. Both lungs are clear. The visualized skeletal structures are unremarkable.  IMPRESSION: No active disease.   Electronically Signed   By: Skipper Cliche M.D.   On: 09/26/2013 23:26       Disposition and Follow-up: Discharge Instructions   Diet - low sodium heart healthy    Complete by:  As directed      Increase activity slowly    Complete by:  As directed             DISPOSITION: Skilled nursing facility  DIET: Mechanical soft, heart healthy   DISCHARGE FOLLOW-UP Follow-up Information   Follow up with Sharmon Revere, MD. Schedule an appointment as soon as possible for a visit in 1 week. (for hospital follow-up)    Specialty:  Orthopedic Surgery   Contact information:   [place] [address] [phone]       Follow up with Vonna Drafts., FNP. Schedule an appointment as soon as possible for a visit in 2 weeks. (for hospital follow-up)    Specialty:  Nurse Practitioner   Contact information:   [address] [place] [address] [phone]       Time spent on Discharge: 39 mins  Signed:   Berlie Hatchel M.D. [facility] 10/02/2013, 11:26 AM Pager: [phone]

## 2013-10-02 NOTE — Clinical Social Work Note (Addendum)
12:48pm- pt to dc to: [place], SNF RN to call report to: [phone] Transportation: [facility] to be scheduled for 2pm  10:59am- CSW reviewed bed offers with pt POA/main caregiver, Blanch Media.  Blanch Media chooses [facility].  Liaison notified.  Projected dc is today. MD updated.  FL2 has been signed and hardscripts have been written.  Nonnie Done, [place] [phone]  Psychiatric & Orthopedics (5N 1-16) Clinical Social Worker

## 2013-10-02 NOTE — Care Management Note (Signed)
CARE MANAGEMENT NOTE 10/02/2013  Patient:  Chris [name], Chris [name]   Account Number:  [id number]  Date Initiated:  09/27/2013  Documentation initiated by:  Ricki Miller  Subjective/Objective Assessment:   73 yr old male admitted with closed Right hip fracture.     Action/Plan:   Patient waiting for Ortho consult.  09/28/13- Per Dr. Alain Marion patient is not a surgical candidate, will require SNF. Social Worker is aware.  9/28- Patient going to [place].   Anticipated DC Date:  10/02/2013   Anticipated DC Plan:  SKILLED NURSING FACILITY  In-house referral  Clinical Social Worker      DC Planning Services  CM consult      [facility] Choice  NA   Choice offered to / List presented to:     DME arranged  NA        [place] arranged  NA      Status of service:  Completed, signed off Medicare Important Message given?   (If response is "NO", the following Medicare IM given date fields will be blank) Date Medicare IM given:   Medicare IM given by:   Date Additional Medicare IM given:  10/02/2013 Additional Medicare IM given by:  Ricki Miller  Discharge Disposition:  [place]  Per UR Regulation:  Reviewed for med. necessity/level of care/duration of stay

## 2013-10-02 NOTE — Evaluation (Signed)
Supervised and reviewed by Terrah Decoster MA CCC-SLP  

## 2013-10-02 NOTE — Clinical Social Work Psychosocial (Signed)
Clinical Social Work Department BRIEF PSYCHOSOCIAL ASSESSMENT 09/29/2013  Patient:  Chris [name], Chris [name]     Account Number:  [id number]     Admit date:  09/26/2013  Clinical Social Worker:  Wylene Men  Date/Time:  09/29/2013 09:37 AM  Referred by:  Physician  Date Referred:  09/29/2013 Referred for  SNF Placement  Psychosocial assessment   Other Referral:   none   Interview type:  Other - See comment Other interview type:   Pt is alert, yet only oriented to person.  CSW spoke with POA/main caregiver, Blanch Media at bedside.    PSYCHOSOCIAL DATA Living Status:  OTHER Admitted from facility:   Level of care:   Primary support name:  Blanch Media Primary support relationship to patient:  NONE Degree of support available:   POA/main caregiver    CURRENT CONCERNS Current Concerns  Post-Acute Placement   Other Concerns:   none    SOCIAL WORK ASSESSMENT / PLAN CSW assessed pt at bedside.  Pt is alert though only oriented to person.  Main caregiver/POA was at bedside-Joyce.  Blanch Media states that prior to this admission, pt was from home with her.  Blanch Media states that the pt is requiring more assistance and she feels that she will not be able to adequately care for pt at home.  POA is requesting SNF/STR at this time.    Caregiver is hopeful for pt's progress; however is realistic realizing that with pt's demenita and fractures, pt may be long term care after STR/SNF.  PT is recommending SNF at this time.  CSW will   Assessment/plan status:  Psychosocial Support/Ongoing Assessment of Needs Other assessment/ plan:   FL2  PASARR   Information/referral to community resources:   SNF    PATIENT'S/FAMILY'S RESPONSE TO PLAN OF CARE: Caregiver is agreeable to plan of STR/SNF upon being medically discharged.  Caregiver is appreciative of CSW assistance and support.

## 2013-10-02 NOTE — Evaluation (Signed)
Clinical/Bedside Swallow Evaluation Patient Details  Name: Chris [name] MRN: [id number] Date of Birth: 1940/12/04  Today's Date: 10/02/2013 Time: 7824-2353 SLP Time Calculation (min): 20 min  Past Medical History:  Past Medical History  Diagnosis Date  . Poor dentition   . COPD (chronic obstructive pulmonary disease)   . CHF (congestive heart failure)   . History of blood transfusion 08/19/2011    "first time"  . Prostate cancer 2011    seed implant  . Atrial flutter     h/o; successful cardioversion,TEE guided  . Iron deficiency anemia     HX BLOOD TX  . Aortic valve regurgitation     moderate AR by echo 2010  . Mitral valve regurgitation     moderate to severe by 2D, mod by TEE '10  . Dementia    Past Surgical History:  Past Surgical History  Procedure Laterality Date  . 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 Esophagogastroduodenoscopy  08/21/2011    Procedure: ESOPHAGOGASTRODUODENOSCOPY (EGD);  Surgeon: Lear Ng, MD;  Location: [facility] ENDOSCOPY;  Service: Endoscopy;  Laterality: N/A;  . Colonoscopy  08/21/2011    Procedure: COLONOSCOPY;  Surgeon: Lear Ng, MD;  Location: [facility] ENDOSCOPY;  Service: Endoscopy;  Laterality: N/A;  . Joint replacement    . Irrigation and debridement knee  12/23/2011  . I&d extremity  12/24/2011    Procedure: IRRIGATION AND DEBRIDEMENT EXTREMITY;  Surgeon: Sharmon Revere, MD;  Location: [place];  Service: Orthopedics;  Laterality: Right;  POSSIBLE WOUND CLOSURE  . Incision and drainage of wound  12/26/2011    Procedure: IRRIGATION AND DEBRIDEMENT WOUND;  Surgeon: Sharmon Revere, MD;  Location: [place];  Service: Orthopedics;  Laterality: Right;  I&D of Wound Right Hip and Wound Closure  . Incision and drainage of wound Right 04/28/2012    Procedure: IRRIGATION AND DEBRIDEMENT WOUND RIGHT HIP POSSIBLE CLOSURE;  Surgeon: Sharmon Revere, MD;  Location: [place];  Service: Orthopedics;  Laterality: Right;  .  Incision and drainage hip Right 04/30/2012    Procedure: IRRIGATION AND DEBRIDEMENT HIP;  Surgeon: Sharmon Revere, MD;  Location: [place];  Service: Orthopedics;  Laterality: Right;  . Incision and drainage hip Right 05/26/2012    Procedure: IRRIGATION AND DEBRIDEMENT HIP and closure of wound;  Surgeon: Sharmon Revere, MD;  Location: [place];  Service: Orthopedics;  Laterality: Right;   HPI:  NOCHOLAS [name] is a 73 y.o. male with history of dementia, atrial fibrillation, COPD was brought to the ER after patient had a fall at his house. Patient was found to have right proximal femur fracture in the greater trochanter region surrounding the known prosthesis.  Orthopedics recommended nonoperative treatment. Chest x-ray showed new left basilar airspace disease and left pleural effusion worrisome for pneumonia, possibly due to aspiration   Assessment / Plan / Recommendation Clinical Impression  Pt demonstrates swallow function within normal limits at this time. His sister/caretaker reports that at home, he displays some oral holding, and that his typical diet consists of chopped foods. Oral holding was not seen during this assessment, but SLP did educate the family about dementia related swallow changes. Given his diet preferences, recommend a dysphagia 2 (chopped) with thin liquids. No therapy services needed. SLP signing off.    Aspiration Risk  Mild    Diet Recommendation Dysphagia 2 (chopped);Thin liquid   Liquid Administration via: Cup Medication Administration: Crushed with puree Postural Changes  and/or Swallow Maneuvers: Seated upright 90 degrees    Other  Recommendations Oral Care Recommendations: Oral care BID   Follow Up Recommendations       Frequency and Duration        Pertinent Vitals/Pain none    SLP Swallow Goals     Swallow Study Prior Functional Status       General Date of Onset: 09/26/13 HPI: Chris PITTINGER is a 73 y.o. male with history of dementia, atrial  fibrillation, COPD was brought to the ER after patient had a fall at his house. Patient was found to have right proximal femur fracture in the greater trochanter region surrounding the known prosthesis.  Orthopedics recommended nonoperative treatment. Chest x-ray showed new left basilar airspace disease and left pleural effusion worrisome for pneumonia, possibly due to aspiration Type of Study: Bedside swallow evaluation Diet Prior to this Study: Regular;Thin liquids Temperature Spikes Noted: No Respiratory Status: Nasal cannula Behavior/Cognition: Cooperative;Pleasant mood;Alert;Requires cueing Oral Cavity - Dentition: Adequate natural dentition Self-Feeding Abilities: Able to feed self;Needs assist Patient Positioning: Upright in bed Baseline Vocal Quality: Clear Volitional Cough: Cognitively unable to elicit Volitional Swallow: Unable to elicit    Oral/Motor/Sensory Function Overall Oral Motor/Sensory Function: Appears within functional limits for tasks assessed   Ice Chips     Thin Liquid Thin Liquid: Within functional limits Presentation: Cup;Straw    Nectar Thick     Honey Thick     Puree Puree: Within functional limits Presentation: Spoon   Solid   GO    Solid: Within functional limits       Eden Emms 10/02/2013,2:05 PM

## 2013-10-09 ENCOUNTER — Telehealth: Payer: Self-pay | Admitting: *Deleted

## 2013-10-09 NOTE — Telephone Encounter (Signed)
Called patient to r/s appointment on 11/07/13 at 11:30, patient's sister states that pt fell and fractured his hip and can't walk at this time and is staying at a rehab unit, appointment has been cancelled and she will call back and r/s at a later time.

## 2013-11-05 DEATH — deceased

## 2013-11-07 ENCOUNTER — Ambulatory Visit: Payer: Medicare Other | Admitting: Nurse Practitioner

## 2014-11-19 IMAGING — CR DG CHEST 2V
1 series · 1 of 1 positions shown · non-contrast
Comparison: 06/10/2013

CLINICAL DATA: Sternal chest pain for several days.

EXAM:
CHEST  2 VIEW

[view not recorded]
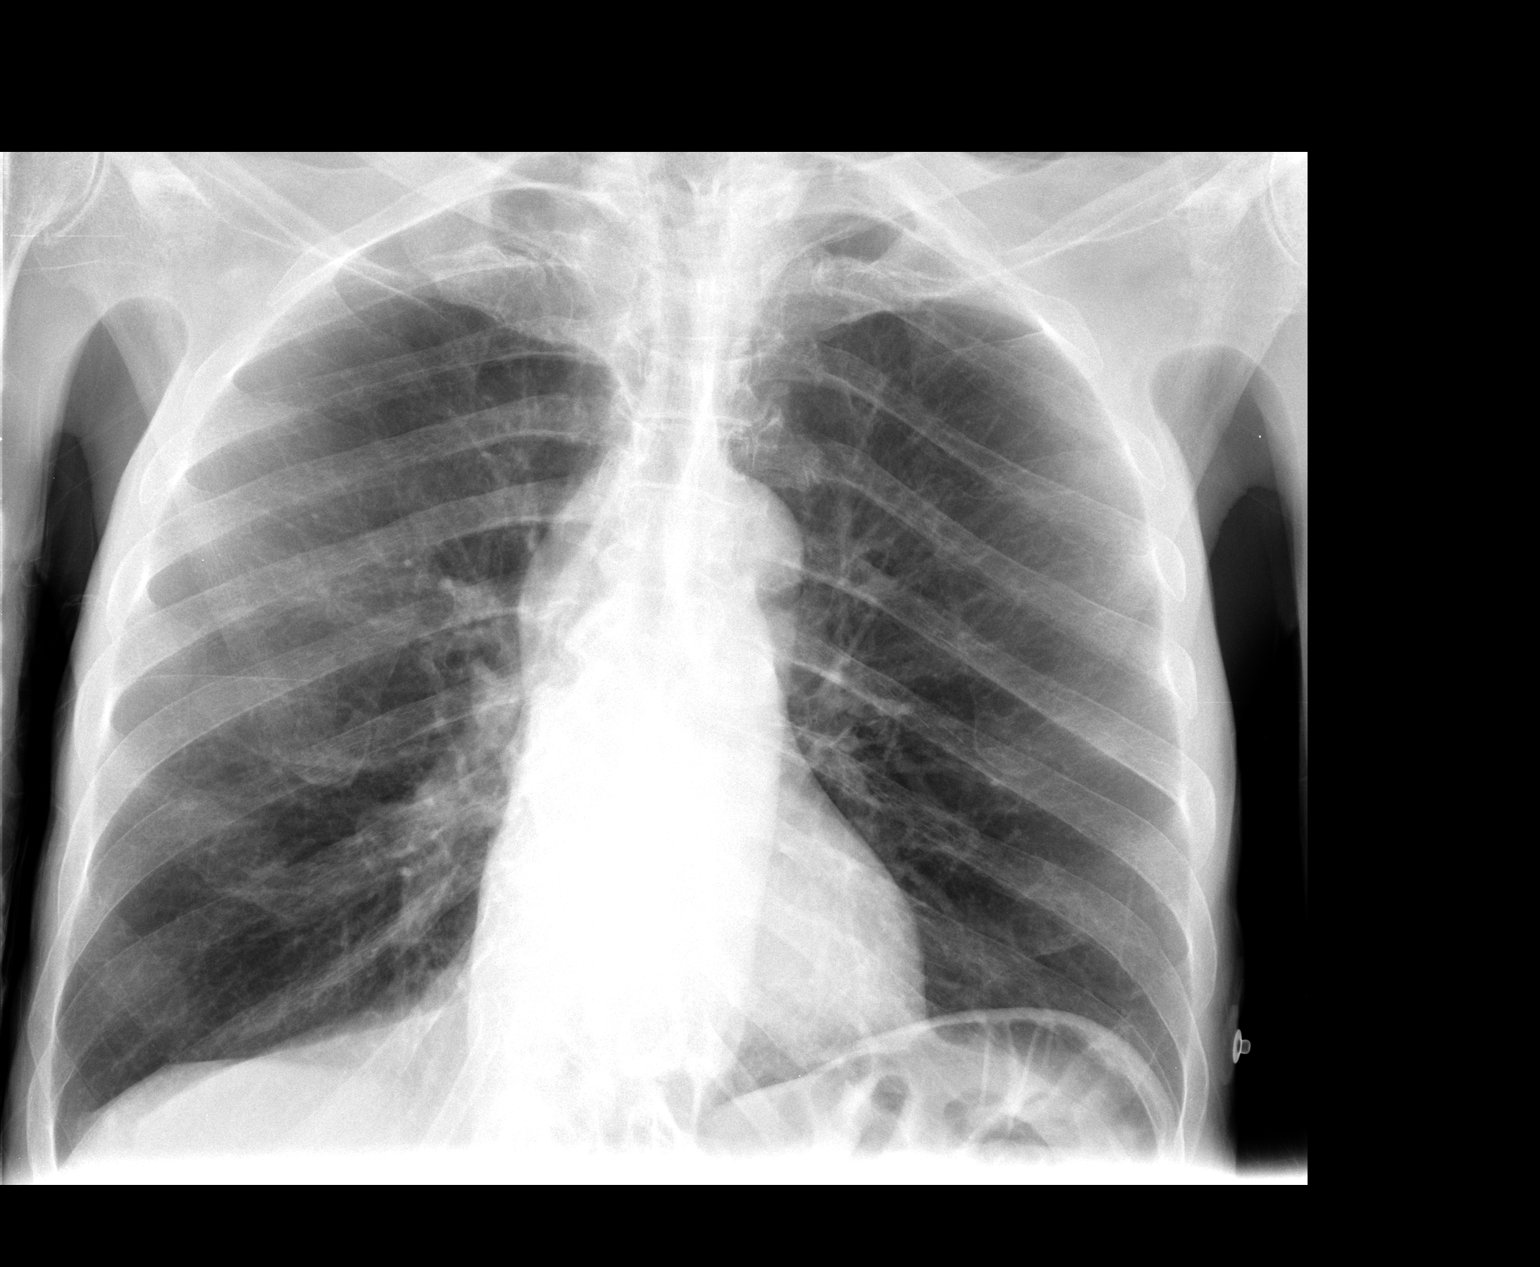

[1 of 1 positions shown; findings below may reference images not displayed]

FINDINGS: Hyperinflation consistent with emphysema. The heart size and
mediastinal contours are within normal limits. Both lungs are clear.
The visualized skeletal structures are unremarkable.
IMPRESSION: Emphysematous changes in the lungs. No evidence of active pulmonary
disease.

## 2015-02-06 IMAGING — CT CT HEAD W/O CM
2 series · 16 of 30 positions shown, 18 images · non-contrast
Comparison: MRI 03/02/2012

CLINICAL DATA: Fell this morning and hit head with small laceration
on the right

EXAM:
CT HEAD WITHOUT CONTRAST
TECHNIQUE: Contiguous axial images were obtained from the base of the skull
through the vertex without intravenous contrast.

[Series 201: head w/o, idose (1) · axial · non-contrast · 0.49mm/px · z∈[+715,+835]mm · 8 of 32 slices shown, 10 images]
[im 4/32  brain]
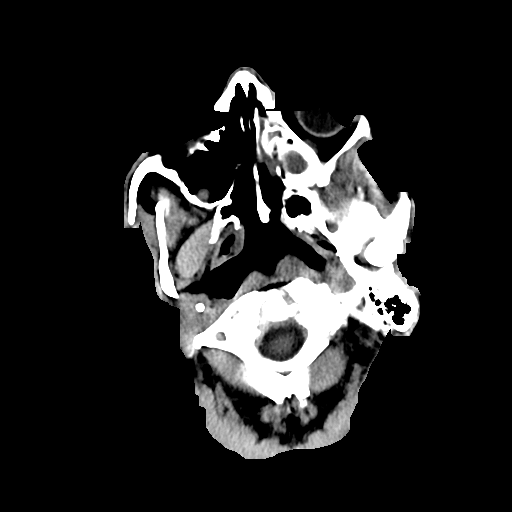
[im 4/32  bone]
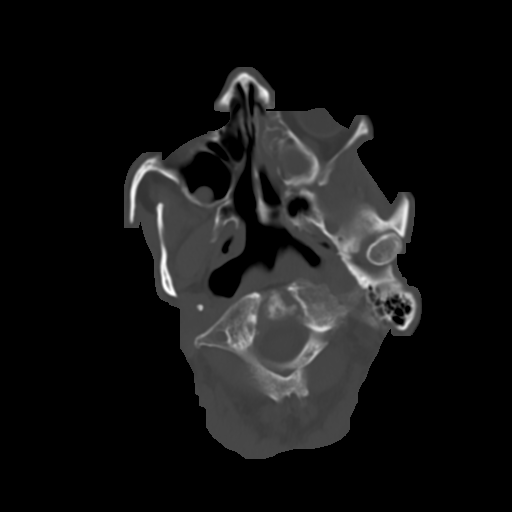
[im 7/32  brain]
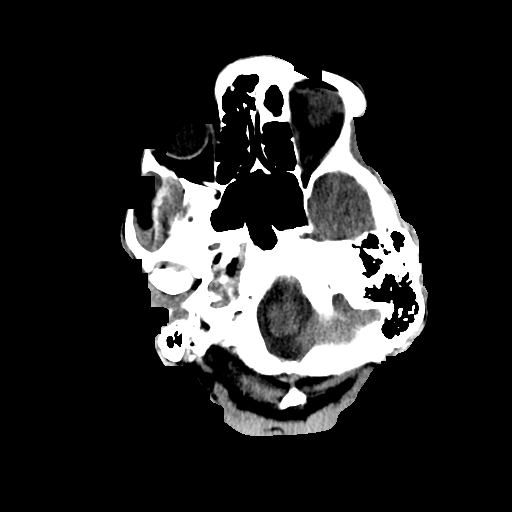
[im 11/32  brain]
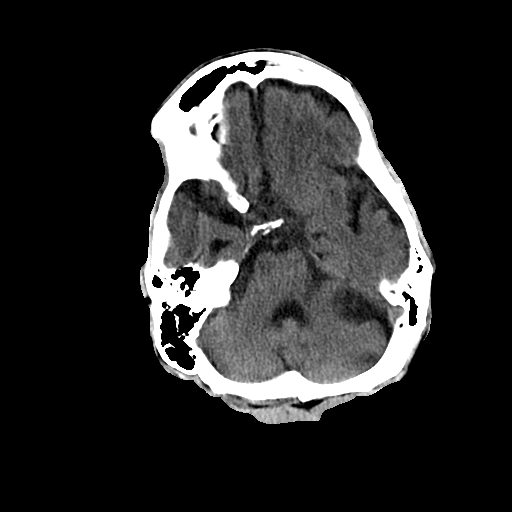
[im 14/32  brain]
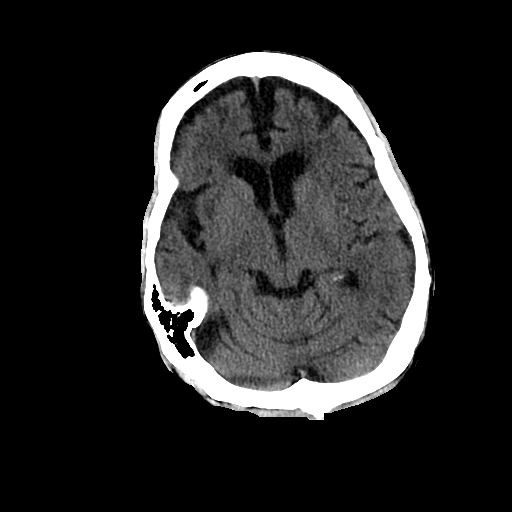
[im 18/32  brain]
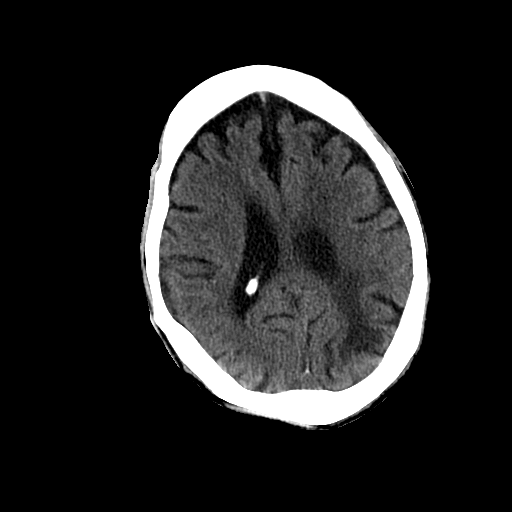
[im 18/32  bone]
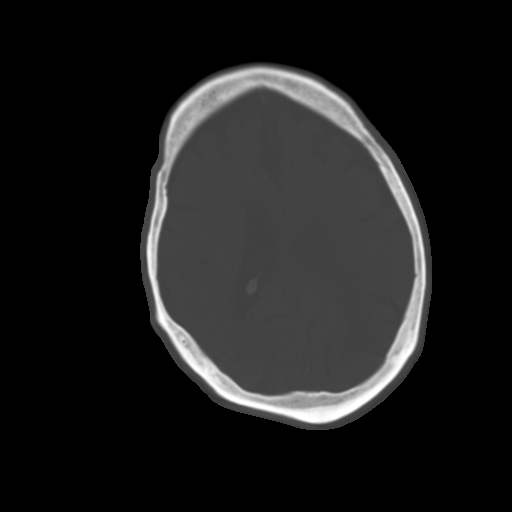
[im 21/32  brain]
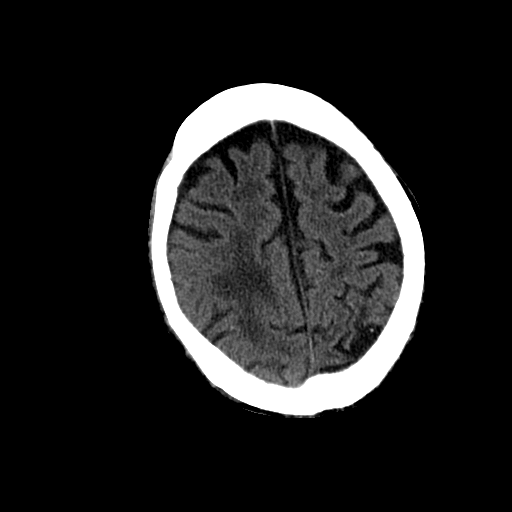
[im 25/32  brain]
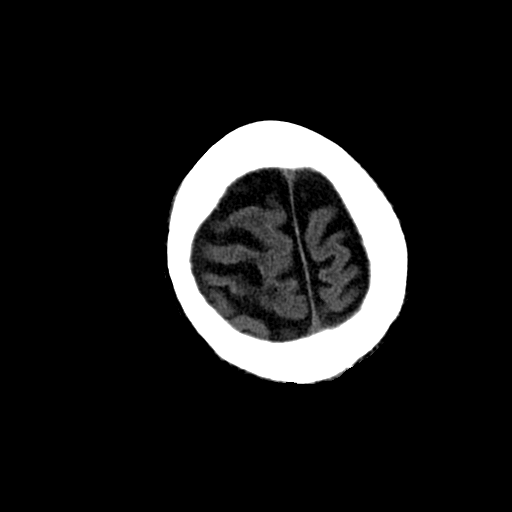
[im 28/32  brain]
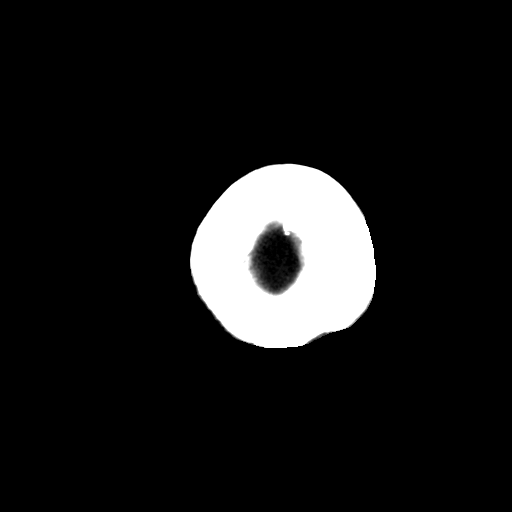

[Series 202: head w/o bone, idose (1) · axial · non-contrast · 0.49mm/px · z∈[+713,+838]mm · 8 of 64 slices shown]
[im 7/64  bone]
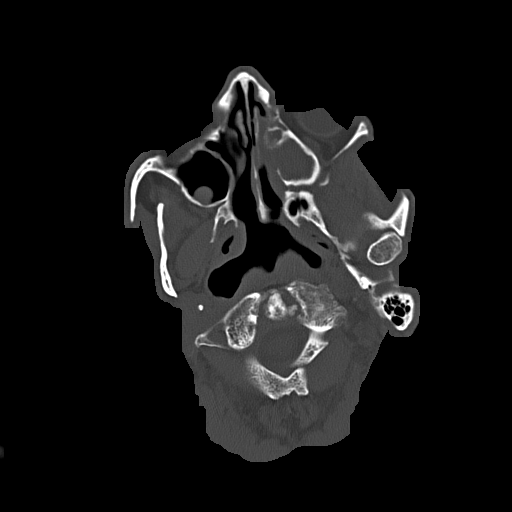
[im 14/64  bone]
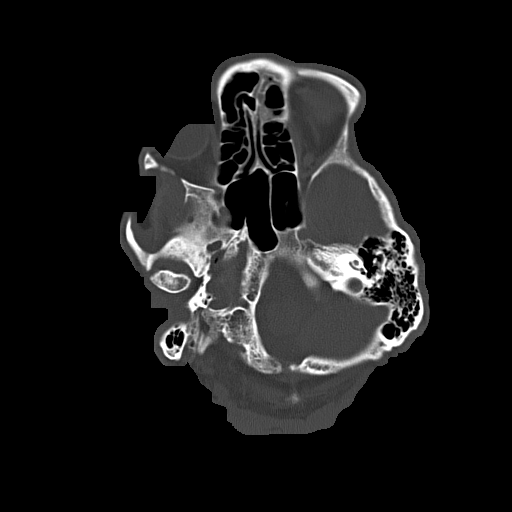
[im 20/64  bone]
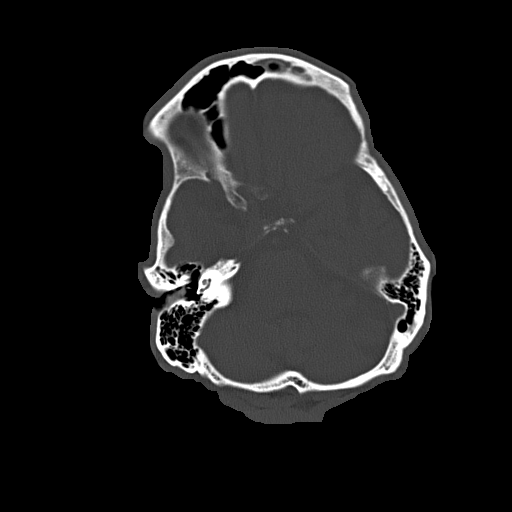
[im 27/64  bone]
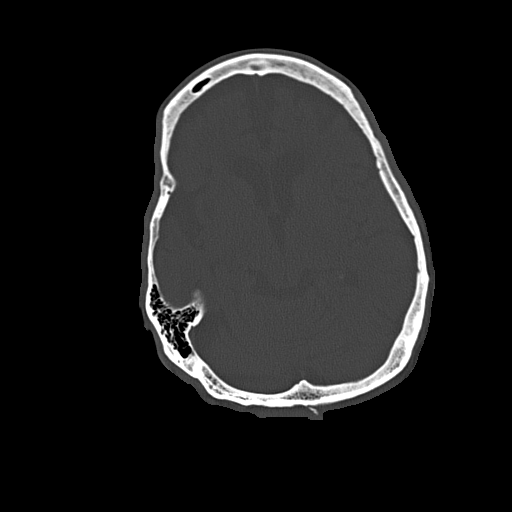
[im 37/64  bone]
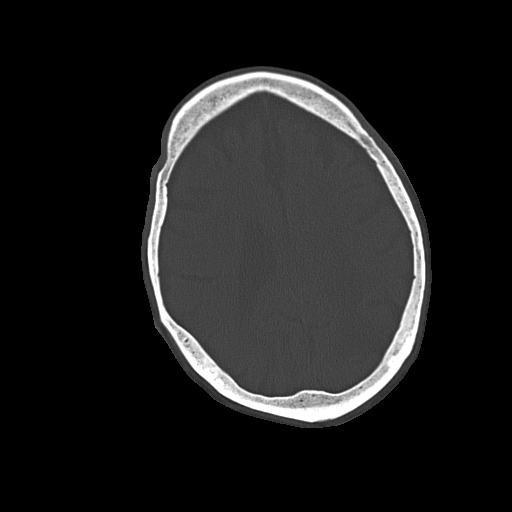
[im 44/64  bone]
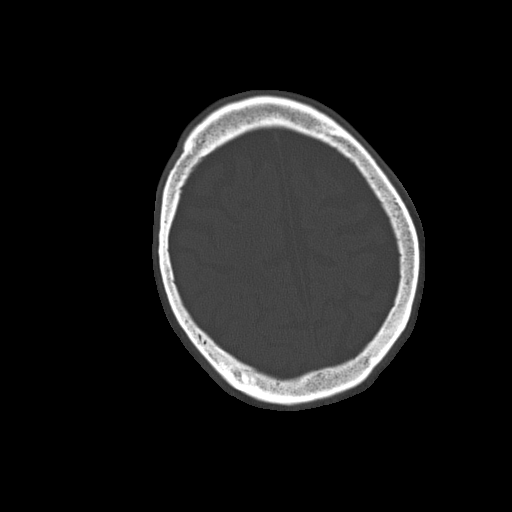
[im 50/64  bone]
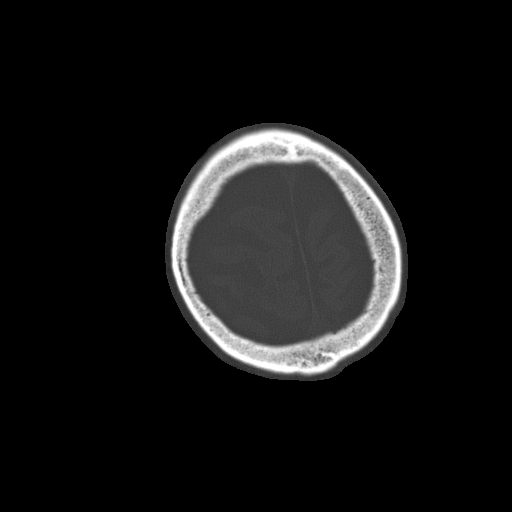
[im 57/64  bone]
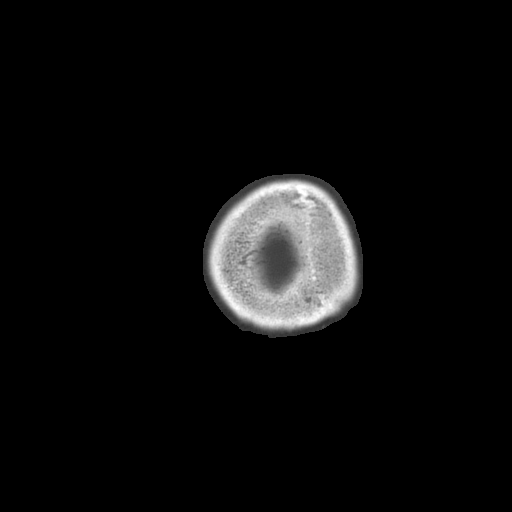

[16 of 30 positions shown; findings below may reference images not displayed]

FINDINGS: Severe atrophy. Focal encephalomalacia left occipital lobe similar
to prior study. Significant diffuse low attenuation in the deep
white matter. No hemorrhage or extra-axial fluid. No infarct or
mass. No skull fracture. Scattered paranasal sinusitis. Complete
opacification left maxillary sinus.
IMPRESSION: No acute abnormalities.  Severe chronic change

## 2021-09-01 ENCOUNTER — Telehealth: Payer: Self-pay | Admitting: Radiation Therapy

## 2021-09-01 NOTE — Telephone Encounter (Signed)
Called the numbers listed on file, unable to leave a message for [facility], but did leave a message for Deliah Goody requesting a call back to get Mr. Chris [name] in to see Dr. Tammi Klippel.   Mont Dutton R.T.(R)(T) Radiation Special Procedures Navigator
